# Patient Record
Sex: Male | Born: 1980 | Race: Black or African American | Hispanic: No | Marital: Married | Smoking: Current every day smoker
Health system: Southern US, Community
[De-identification: ages and names within clinical notes are randomized; demographics above are authoritative.]

## PROBLEM LIST (undated history)

## (undated) DIAGNOSIS — M549 Dorsalgia, unspecified: Secondary | ICD-10-CM

## (undated) DIAGNOSIS — I10 Essential (primary) hypertension: Secondary | ICD-10-CM

## (undated) DIAGNOSIS — G8929 Other chronic pain: Secondary | ICD-10-CM

---

## 2012-11-19 ENCOUNTER — Emergency Department (HOSPITAL_COMMUNITY)
Admission: EM | Admit: 2012-11-19 | Discharge: 2012-11-19 | Disposition: A | Discharge status: Home / Self Care | Payer: Medicaid Other | Attending: Emergency Medicine | Admitting: Emergency Medicine

## 2012-11-19 ENCOUNTER — Encounter (HOSPITAL_COMMUNITY): Payer: Self-pay | Admitting: Emergency Medicine

## 2012-11-19 ENCOUNTER — Emergency Department (HOSPITAL_COMMUNITY): Payer: Medicaid Other

## 2012-11-19 DIAGNOSIS — Y9367 Activity, basketball: Secondary | ICD-10-CM | POA: Insufficient documentation

## 2012-11-19 DIAGNOSIS — S20219A Contusion of unspecified front wall of thorax, initial encounter: Secondary | ICD-10-CM | POA: Insufficient documentation

## 2012-11-19 DIAGNOSIS — Y9239 Other specified sports and athletic area as the place of occurrence of the external cause: Secondary | ICD-10-CM | POA: Insufficient documentation

## 2012-11-19 DIAGNOSIS — S20211A Contusion of right front wall of thorax, initial encounter: Secondary | ICD-10-CM

## 2012-11-19 DIAGNOSIS — S298XXA Other specified injuries of thorax, initial encounter: Secondary | ICD-10-CM | POA: Insufficient documentation

## 2012-11-19 DIAGNOSIS — X58XXXA Exposure to other specified factors, initial encounter: Secondary | ICD-10-CM | POA: Insufficient documentation

## 2012-11-19 DIAGNOSIS — R0789 Other chest pain: Secondary | ICD-10-CM

## 2012-11-19 DIAGNOSIS — F17200 Nicotine dependence, unspecified, uncomplicated: Secondary | ICD-10-CM | POA: Insufficient documentation

## 2012-11-19 MED ORDER — HYDROCODONE-ACETAMINOPHEN 5-325 MG PO TABS: 1.0000 | ORAL_TABLET | Freq: Four times a day (QID) | ORAL | Status: DC | PRN

## 2012-11-19 MED ORDER — IBUPROFEN 800 MG PO TABS: 800.0000 mg | ORAL_TABLET | Freq: Three times a day (TID) | ORAL | Status: DC | PRN

## 2012-11-19 NOTE — ED Notes (Signed)
Pt is in a gown and on the monitor. 

## 2012-11-19 NOTE — ED Notes (Signed)
Pt returned from xray

## 2012-11-19 NOTE — ED Provider Notes (Signed)
Medical screening examination/treatment/procedure(s) were conducted as a shared visit with non-physician practitioner(s) or resident and myself. I personally evaluated the patient during the encounter and agree with the findings and plan unless otherwise indicated. I have reviewed any xrays and/ or EKG's with the provider and I agree with interpretation.  Right rib pain since knee injury during bball on Tue. Mild tender to palpation, no step off. Pain with movement.  Lungs clear, no distress. Discussed rib contusion for occult fx. Xray reviewed, no acute findings and supportive care. DC Rib contusion   Enid Skeens, MD 11/19/12 2128

## 2012-11-19 NOTE — ED Notes (Signed)
Pt was playing basketball on Tuesday and kneed in the right central chest area.  C/o of soreness and "muscle pain" especially when lifting right arm. Pt in NAD A&O.

## 2012-11-19 NOTE — ED Provider Notes (Signed)
CSN: [id number]     Arrival date & time 11/19/12  7829 History   First MD Initiated Contact with Patient 11/19/12 [phone]     Chief Complaint  Patient presents with  . Chest Pain   (Consider location/radiation/quality/duration/timing/severity/associated sxs/prior Treatment) HPI Patient presents emergency Department, right-sided chest pain, following an injury, that occurred on Tuesday.  Patient, states he was playing basketball.  He was kneed in the right lateral side of his ribs.  Patient, states she was using a heating pad, with some relief of his pain did not take any medications prior to arrival.  Patient denies shortness of breath, nausea, vomiting, abdominal pain, back pain, dizziness, lightheadedness, or syncope.  The patient, states, that he has more pain when he sneezes or coughs History reviewed. No pertinent past medical history. History reviewed. No pertinent past surgical history. History reviewed. No pertinent family history. History  Substance Use Topics  . Smoking status: Current Every Day Smoker  . Smokeless tobacco: Never Used  . Alcohol Use: 3.0 oz/week    5 Cans of beer per week    Review of Systems All other systems negative except as documented in the HPI. All pertinent positives and negatives as reviewed in the HPI. Allergies  Review of patient's allergies indicates no known allergies.  Home Medications   Current Outpatient Rx  Name  Route  Sig  Dispense  Refill  . ibuprofen (ADVIL,MOTRIN) 200 MG tablet   Oral   Take 800 mg by mouth every 6 (six) hours as needed for pain.          BP 146/83  Pulse 99  Temp(Src) 97.9 F (36.6 C) (Oral)  Resp 18  SpO2 100% Physical Exam  Nursing note and vitals reviewed. Constitutional: He is oriented to person, place, and time. He appears well-developed and well-nourished. No distress.  HENT:  Head: Normocephalic and atraumatic.  Mouth/Throat: Oropharynx is clear and moist.  Eyes: Pupils are equal, round, and  reactive to light.  Cardiovascular: Normal rate, regular rhythm and normal heart sounds.  Exam reveals no gallop and no friction rub.   No murmur heard. Pulmonary/Chest: Effort normal and breath sounds normal. No respiratory distress. He exhibits tenderness and bony tenderness. He exhibits no crepitus, no deformity and no swelling.    Neurological: He is alert and oriented to person, place, and time.  Skin: Skin is warm and dry. No erythema.    ED Course  Procedures (including critical care time) Labs Review Labs Reviewed - No data to display Imaging Review Dg Ribs Unilateral W/chest Right  11/19/2012   CLINICAL DATA:  Right lower anterior rib pain. Patient injured 5 days ago.  EXAM: RIGHT RIBS AND CHEST - 3+ VIEW  COMPARISON:  None.  FINDINGS: No fracture or other bone lesions are seen involving the ribs. There is no evidence of pneumothorax or pleural effusion. Both lungs are clear. Heart size and mediastinal contours are within normal limits.  IMPRESSION: Negative.   Electronically Signed   By: Amie Portland M.D.   On: 11/19/2012 09:31   Patient does not have any abnormality noted on rib films.  The patient be treated for contusion of his ribs and chest wall.  Told to return here as needed.  Advised to use ice and heat on the area    [employer], [place] 11/19/12 0957

## 2013-04-20 ENCOUNTER — Encounter (HOSPITAL_COMMUNITY): Payer: Self-pay | Admitting: Emergency Medicine

## 2013-04-20 ENCOUNTER — Emergency Department (HOSPITAL_COMMUNITY): Admission: EM | Admit: 2013-04-20 | Discharge: 2013-04-20 | Payer: Self-pay

## 2013-04-20 ENCOUNTER — Emergency Department (HOSPITAL_COMMUNITY)
Admission: EM | Admit: 2013-04-20 | Discharge: 2013-04-20 | Disposition: A | Discharge status: Home / Self Care | Payer: Medicaid Other | Attending: Emergency Medicine | Admitting: Emergency Medicine

## 2013-04-20 DIAGNOSIS — S025XXA Fracture of tooth (traumatic), initial encounter for closed fracture: Secondary | ICD-10-CM

## 2013-04-20 DIAGNOSIS — R51 Headache: Secondary | ICD-10-CM | POA: Insufficient documentation

## 2013-04-20 DIAGNOSIS — G479 Sleep disorder, unspecified: Secondary | ICD-10-CM | POA: Insufficient documentation

## 2013-04-20 DIAGNOSIS — F17200 Nicotine dependence, unspecified, uncomplicated: Secondary | ICD-10-CM | POA: Insufficient documentation

## 2013-04-20 DIAGNOSIS — R638 Other symptoms and signs concerning food and fluid intake: Secondary | ICD-10-CM | POA: Insufficient documentation

## 2013-04-20 DIAGNOSIS — K0381 Cracked tooth: Secondary | ICD-10-CM | POA: Insufficient documentation

## 2013-04-20 DIAGNOSIS — K029 Dental caries, unspecified: Secondary | ICD-10-CM | POA: Insufficient documentation

## 2013-04-20 DIAGNOSIS — K0889 Other specified disorders of teeth and supporting structures: Secondary | ICD-10-CM

## 2013-04-20 MED ORDER — AMOXICILLIN 500 MG PO CAPS: 500.0000 mg | ORAL_CAPSULE | Freq: Three times a day (TID) | ORAL | Status: DC

## 2013-04-20 MED ORDER — HYDROCODONE-ACETAMINOPHEN 5-325 MG PO TABS: 1.0000 | ORAL_TABLET | ORAL | Status: DC | PRN

## 2013-04-20 NOTE — ED Notes (Signed)
C/O left lower dental pain x 2 days. States it "hurt so bad, I just pulled it out". States tooth was broken. Appears to be pieces of tooth still remaining in gum.

## 2013-04-20 NOTE — ED Provider Notes (Signed)
CSN: [id number]     Arrival date & time 04/20/13  0900 History  This chart was scribed for non-physician practitioner Phillip Matasiffany G Dolph Tavano, [name] working with Glynn OctaveStephen Rancour, MD by Leone PayorSonum Patel, ED Scribe. This patient was seen in room TR05C/TR05C and the patient's care was started at 9:18 AM.    Chief Complaint  Patient presents with  . Dental Pain      The history is provided by the patient. No language interpreter was used.    HPI Comments: Phillip Mcconnell is a 33 y.o. male who presents to the Emergency Department complaining of constant left lower dental pain that worsened 2 days ago. Pt states he has had this pain for years but decided to pull his own tooth at home 2 days ago. He states the tooth was already broken at the time and he couldn't afford to see a dentist. He reports associated HA, decreased appetite, and difficulty sleeping due to the pain. He has not tried any home remedies for his symptoms. He denies known fever, nausea, vomiting.   History reviewed. No pertinent past medical history. History reviewed. No pertinent past surgical history. No family history on file. History  Substance Use Topics  . Smoking status: Current Every Day Smoker  . Smokeless tobacco: Never Used  . Alcohol Use: 3.0 oz/week    5 Cans of beer per week    Review of Systems  Constitutional: Negative for fever.  HENT: Positive for dental problem.   Gastrointestinal: Negative for nausea and vomiting.  All other systems reviewed and are negative.      Allergies  Review of patient's allergies indicates no known allergies.  Home Medications   Current Outpatient Rx  Name  Route  Sig  Dispense  Refill  . ibuprofen (ADVIL,MOTRIN) 200 MG tablet   Oral   Take 800 mg by mouth every 6 (six) hours as needed for pain.         Marland Kitchen. amoxicillin (AMOXIL) 500 MG capsule   Oral   Take 1 capsule (500 mg total) by mouth 3 (three) times daily.   21 capsule   0   . HYDROcodone-acetaminophen  (NORCO/VICODIN) 5-325 MG per tablet   Oral   Take 1-2 tablets by mouth every 4 (four) hours as needed.   20 tablet   0    BP 142/107  Pulse 84  Temp(Src) 98.5 F (36.9 C) (Oral)  SpO2 99% Physical Exam  Nursing note and vitals reviewed. Constitutional: He is oriented to person, place, and time. He appears well-developed and well-nourished.  HENT:  Head: Normocephalic and atraumatic.  Mouth/Throat: Dental caries present.    Eyes: Conjunctivae and EOM are normal. Pupils are equal, round, and reactive to light.  Neck: Normal range of motion. Neck supple.  Cardiovascular: Normal rate and regular rhythm.   Pulmonary/Chest: Effort normal and breath sounds normal.  Abdominal: He exhibits no distension.  Neurological: He is alert and oriented to person, place, and time.  Skin: Skin is warm and dry.  Psychiatric: He has a normal mood and affect.    ED Course  Dental Date/Time: 04/20/2013 9:30 AM Performed by: Phillip MatasGREENE, Candia Kingsbury G Authorized by: Phillip MatasGREENE, Brees Hounshell G Consent: Verbal consent obtained. Risks and benefits: risks, benefits and alternatives were discussed Consent given by: patient Time out: Immediately prior to procedure a "time out" was called to verify the correct patient, procedure, equipment, support staff and site/side marked as required. Local anesthesia used: yes Anesthesia: nerve block Local anesthetic: lidocaine 1% with  epinephrine Patient sedated: no Patient tolerance: Patient tolerated the procedure well with no immediate complications. Comments: Patients pain completely resolved with dental block   (including critical care time)  DIAGNOSTIC STUDIES: Oxygen Saturation is 99% on RA, normal by my interpretation.    COORDINATION OF CARE: 9:19 AM Will perform dental block. Will prescribe antibiotics and pain medication. Discussed treatment plan with pt at bedside and pt agreed to plan.   Labs Review Labs Reviewed - No data to display Imaging Review No results  found.   EKG Interpretation None      MDM   Final diagnoses:  Broken tooth  Pain, dental    Patient has dental pain. No emergent s/sx's present. Patent airway. No trismus.  Will be given pain medication and antibiotics. I discussed the need to call dentist within 24/48 hours for follow-up. Dental referral given. Return to ED precautions given.  Pt voiced understanding and has agreed to follow-up.   33 y.o.[name]'s evaluation in the Emergency Department is complete. It has been determined that no acute conditions requiring further emergency intervention are present at this time. The patient/guardian have been advised of the diagnosis and plan. We have discussed signs and symptoms that warrant return to the ED, such as changes or worsening in symptoms.  Vital signs are stable at discharge. Filed Vitals:   04/20/13 0916  BP: 142/107  Pulse: 84  Temp: 98.5 F (36.9 C)    Patient/guardian has voiced understanding and agreed to follow-up with the PCP or specialist.   Phillip Matas, [name] 04/20/13 0930

## 2013-04-20 NOTE — Discharge Instructions (Signed)
Dental Care and Dentist Visits Dental care supports good overall health. Regular dental visits can also help you avoid dental pain, bleeding, infection, and other more serious health problems in the future. It is important to keep the mouth healthy because diseases in the teeth, gums, and other oral tissues can spread to other areas of the body. Some problems, such as diabetes, heart disease, and pre-term labor have been associated with poor oral health.  See your dentist every 6 months. If you experience emergency problems such as a toothache or broken tooth, go to the dentist right away. If you see your dentist regularly, you may catch problems early. It is easier to be treated for problems in the early stages.  WHAT TO EXPECT AT A DENTIST VISIT  Your dentist will look for many common oral health problems and recommend proper treatment. At your regular dental visit, you can expect:  Gentle cleaning of the teeth and gums. This includes scraping and polishing. This helps to remove the sticky substance around the teeth and gums (plaque). Plaque forms in the mouth shortly after eating. Over time, plaque hardens on the teeth as tartar. If tartar is not removed regularly, it can cause problems. Cleaning also helps remove stains.  Periodic X-rays. These pictures of the teeth and supporting bone will help your dentist assess the health of your teeth.  Periodic fluoride treatments. Fluoride is a natural mineral shown to help strengthen teeth. Fluoride treatmentinvolves applying a fluoride gel or varnish to the teeth. It is most commonly done in children.  Examination of the mouth, tongue, jaws, teeth, and gums to look for any oral health problems, such as:  Cavities (dental caries). This is decay on the tooth caused by plaque, sugar, and acid in the mouth. It is best to catch a cavity when it is small.  Inflammation of the gums caused by plaque buildup (gingivitis).  Problems with the mouth or malformed  or misaligned teeth.  Oral cancer or other diseases of the soft tissues or jaws. KEEP YOUR TEETH AND GUMS HEALTHY For healthy teeth and gums, follow these general guidelines as well as your dentist's specific advice:  Have your teeth professionally cleaned at the dentist every 6 months.  Brush twice daily with a fluoride toothpaste.  Floss your teeth daily.  Ask your dentist if you need fluoride supplements, treatments, or fluoride toothpaste.  Eat a healthy diet. Reduce foods and drinks with added sugar.  Avoid smoking. TREATMENT FOR ORAL HEALTH PROBLEMS If you have oral health problems, treatment varies depending on the conditions present in your teeth and gums.  Your caregiver will most likely recommend good oral hygiene at each visit.  For cavities, gingivitis, or other oral health disease, your caregiver will perform a procedure to treat the problem. This is typically done at a separate appointment. Sometimes your caregiver will refer you to another dental specialist for specific tooth problems or for surgery. SEEK IMMEDIATE DENTAL CARE IF:  You have pain, bleeding, or soreness in the gum, tooth, jaw, or mouth area.  A permanent tooth becomes loose or separated from the gum socket.  You experience a blow or injury to the mouth or jaw area. Document Released: 09/30/2010 Document Revised: 04/12/2011 Document Reviewed: 09/30/2010 [facility] Patient Information 2014 [place], [place].  Dental Fracture You have a dental fracture or injury. This can mean the tooth is loose, has a chip in the enamel or is broken. If just the outer enamel is chipped, there is a good chance  the tooth will not become infected. The only treatment needed may be to smooth off a rough edge. Fractures into the deeper layers (dentin and pulp) cause greater pain and are more likely to become infected. These require you to see a dentist as soon as possible to save the tooth. Loose teeth may need to be wired or  bonded with a plastic splint to hold them in place. A paste may be painted on the open area of the broken tooth to reduce the pain. Antibiotics and pain medicine may be prescribed. Choosing a soft or liquid diet and rinsing the mouth out with warm water after meals may be helpful. See your dentist as recommended. Failure to seek care or follow up with a dentist or other specialist as recommended could result in the loss of your tooth, infection, or permanent dental problems. SEEK MEDICAL CARE IF:   You have increased pain not controlled with medicines.  You have swelling around the tooth, in the face or neck.  You have bleeding which starts, continues, or gets worse.  You have a fever. Document Released: 02/26/2004 Document Revised: 04/12/2011 Document Reviewed: 12/10/2008 [facility] Patient Information 2014 [place]

## 2013-04-20 NOTE — ED Provider Notes (Signed)
Medical screening examination/treatment/procedure(s) were performed by non-physician practitioner and as supervising physician I was immediately available for consultation/collaboration.   EKG Interpretation None       Siddhant Hashemi, MD 04/20/13 1824 

## 2013-12-15 ENCOUNTER — Encounter (HOSPITAL_COMMUNITY): Payer: Self-pay | Admitting: *Deleted

## 2013-12-15 ENCOUNTER — Emergency Department (HOSPITAL_COMMUNITY)
Admission: EM | Admit: 2013-12-15 | Discharge: 2013-12-15 | Disposition: A | Discharge status: Home / Self Care | Payer: Medicaid Other | Attending: Emergency Medicine | Admitting: Emergency Medicine

## 2013-12-15 ENCOUNTER — Emergency Department (HOSPITAL_COMMUNITY): Payer: Medicaid Other

## 2013-12-15 DIAGNOSIS — S4992XA Unspecified injury of left shoulder and upper arm, initial encounter: Secondary | ICD-10-CM | POA: Diagnosis present

## 2013-12-15 DIAGNOSIS — Y9289 Other specified places as the place of occurrence of the external cause: Secondary | ICD-10-CM | POA: Diagnosis not present

## 2013-12-15 DIAGNOSIS — Y998 Other external cause status: Secondary | ICD-10-CM | POA: Diagnosis not present

## 2013-12-15 DIAGNOSIS — Z720 Tobacco use: Secondary | ICD-10-CM | POA: Insufficient documentation

## 2013-12-15 DIAGNOSIS — Z792 Long term (current) use of antibiotics: Secondary | ICD-10-CM | POA: Diagnosis not present

## 2013-12-15 DIAGNOSIS — Y9389 Activity, other specified: Secondary | ICD-10-CM | POA: Insufficient documentation

## 2013-12-15 DIAGNOSIS — X58XXXA Exposure to other specified factors, initial encounter: Secondary | ICD-10-CM | POA: Insufficient documentation

## 2013-12-15 MED ORDER — NAPROXEN 500 MG PO TABS: 500.0000 mg | ORAL_TABLET | Freq: Two times a day (BID) | ORAL | Status: DC

## 2013-12-15 MED ORDER — HYDROCODONE-ACETAMINOPHEN 5-325 MG PO TABS: 1.0000 | ORAL_TABLET | Freq: Four times a day (QID) | ORAL | Status: DC | PRN

## 2013-12-15 MED ORDER — HYDROCODONE-ACETAMINOPHEN 5-325 MG PO TABS
1.0000 | ORAL_TABLET | Freq: Once | ORAL | Status: AC
  Administered 2013-12-15: 1 via ORAL
  Filled 2013-12-15: qty 1

## 2013-12-15 NOTE — ED Notes (Signed)
Pt states that he was moving his pool table on Monday and woke with left shoulder pain on Tuesday. Pt states that he has tried medication and heat/ice with little relief. No deformity noted pt states that it is tender to touch.

## 2013-12-15 NOTE — ED Notes (Signed)
Patient transported to X-ray 

## 2013-12-15 NOTE — Discharge Instructions (Signed)
X-rays of the shoulder negative. Suspect rotator cuff injury. Follow-up with orthopedics. Wear the sling as needed for comfort. Take anti-inflammatory medicine on a normal basis that's the Naprosyn. Supplement with hydrocodone as needed.

## 2013-12-15 NOTE — ED Provider Notes (Signed)
CSN: [id number]     Arrival date & time 12/15/13  0745 History   First MD Initiated Contact with Patient 12/15/13 0800     Chief Complaint  Patient presents with  . Shoulder Injury     (Consider location/radiation/quality/duration/timing/severity/associated sxs/prior Treatment) Patient is a [age] y.o. male presenting with shoulder injury. The history is provided by the patient.  Shoulder Injury Pertinent negatives include no chest pain, no abdominal pain, no headaches and no shortness of breath.  patient with left shoulder pain pain started on Tuesday after moving a heavy pool table on Monday. Patient has limited range of motion in that left shoulder cannot raise his hand above his head.describe some tingling to the lateral aspect of the shoulder. Most of the pain is in the anterior part of the shoulder. No direct injury. The patient was pulling on the pool table very hard. Patient states that the pain is 8 out of 10 made worse by movement of the left shoulder.  History reviewed. No pertinent past medical history. History reviewed. No pertinent past surgical history. No family history on file. History  Substance Use Topics  . Smoking status: Current Every Day Smoker  . Smokeless tobacco: Never Used  . Alcohol Use: 3.0 oz/week    5 Cans of beer per week    Review of Systems  Constitutional: Negative for fever.  HENT: Negative for congestion.   Eyes: Negative for visual disturbance.  Respiratory: Negative for shortness of breath.   Cardiovascular: Negative for chest pain.  Gastrointestinal: Negative for abdominal pain.  Genitourinary: Negative for hematuria.  Musculoskeletal: Negative for back pain and neck pain.  Skin: Negative for rash.  Neurological: Negative for headaches.  Hematological: Does not bruise/bleed easily.  Psychiatric/Behavioral: Negative for confusion.      Allergies  Review of patient's allergies indicates no known allergies.  Home Medications   Prior to  Admission medications   Medication Sig Start Date End Date Taking? Authorizing Provider  diphenhydramine-acetaminophen (TYLENOL PM) 25-500 MG TABS Take 2 tablets by mouth at bedtime as needed (for sleep).   Yes Historical Provider, MD  amoxicillin (AMOXIL) 500 MG capsule Take 1 capsule (500 mg total) by mouth 3 (three) times daily. 04/20/13   Dorthula Matasiffany G Greene, [name]  HYDROcodone-acetaminophen (NORCO/VICODIN) 5-325 MG per tablet Take 1-2 tablets by mouth every 6 (six) hours as needed for moderate pain. 12/15/13   Vanetta MuldersScott Cynithia Hakimi, MD  ibuprofen (ADVIL,MOTRIN) 200 MG tablet Take 800 mg by mouth every 6 (six) hours as needed for pain.    Historical Provider, MD  naproxen (NAPROSYN) 500 MG tablet Take 1 tablet (500 mg total) by mouth 2 (two) times daily. 12/15/13   Vanetta MuldersScott Naquita Nappier, MD   BP 138/97 mmHg  Pulse 89  Temp(Src) 98.2 F (36.8 C) (Oral)  Resp 18  SpO2 100% Physical Exam  Constitutional: He is oriented to person, place, and time. He appears well-developed and well-nourished. No distress.  HENT:  Head: Normocephalic and atraumatic.  Mouth/Throat: Oropharynx is clear and moist.  Eyes: Conjunctivae and EOM are normal. Pupils are equal, round, and reactive to light.  Neck: Normal range of motion.  Cardiovascular: Normal rate, regular rhythm and normal heart sounds.   No murmur heard. Pulmonary/Chest: Effort normal and breath sounds normal. No respiratory distress.  Abdominal: Soft. Bowel sounds are normal. There is no tenderness.  Musculoskeletal: He exhibits tenderness.  Tenderness to palpation to the anterior aspect of the left shoulder. Good range of motion at the elbow and wrist and  fingers limited range of motion due to pain at the shoulder. No obvious deformity. Radial pulse distally is 2+. No sensory deficits to the hand. Some tingling in the distribution of the axillary nerve.  Neurological: He is alert and oriented to person, place, and time. No cranial nerve deficit. He exhibits  normal muscle tone. Coordination normal.  Skin: Skin is warm. No rash noted.  Nursing note and vitals reviewed.   ED Course  Procedures (including critical care time) Labs Review Labs Reviewed - No data to display  Imaging Review Dg Shoulder Left  12/15/2013   CLINICAL DATA:  Shoulder injury, left, initial encounter. The patient was pulling a pool table 6 days ago with anterior shoulder pain since that time.  EXAM: LEFT SHOULDER - 2+ VIEW  COMPARISON:  None.  FINDINGS: There is no evidence of fracture or dislocation. There is no evidence of arthropathy or other focal bone abnormality. Soft tissues are unremarkable.  IMPRESSION: Negative left shoulder radiographs.   Electronically Signed   By: Gennette Pachris  Mattern M.D.   On: 12/15/2013 10:03     EKG Interpretation None      MDM   Final diagnoses:  Shoulder injury, left, initial encounter    Left shoulder pain is concerning for rotator cuff injury. Based on the decreased range of motion. And the weighted occurred pulling on heavy pole table. Patient treated with sling and follow-up with orthopedics as well as anti-inflammatory medicine and pain medicine. X-rays of the area showed no bony abnormalities. There is no significant neuro deficits. Distal radial pulses 2+.    Vanetta MuldersScott Abdulai Blaylock, MD 12/15/13 1026

## 2014-03-13 ENCOUNTER — Emergency Department (HOSPITAL_COMMUNITY)
Admission: EM | Admit: 2014-03-13 | Discharge: 2014-03-13 | Disposition: A | Discharge status: Home / Self Care | Payer: Medicaid Other | Attending: Emergency Medicine | Admitting: Emergency Medicine

## 2014-03-13 ENCOUNTER — Encounter (HOSPITAL_COMMUNITY): Payer: Self-pay | Admitting: Emergency Medicine

## 2014-03-13 DIAGNOSIS — M545 Low back pain, unspecified: Secondary | ICD-10-CM

## 2014-03-13 DIAGNOSIS — Z720 Tobacco use: Secondary | ICD-10-CM | POA: Insufficient documentation

## 2014-03-13 DIAGNOSIS — Z791 Long term (current) use of non-steroidal anti-inflammatories (NSAID): Secondary | ICD-10-CM | POA: Diagnosis not present

## 2014-03-13 DIAGNOSIS — Z792 Long term (current) use of antibiotics: Secondary | ICD-10-CM | POA: Diagnosis not present

## 2014-03-13 MED ORDER — TRAMADOL HCL 50 MG PO TABS: 50.0000 mg | ORAL_TABLET | Freq: Four times a day (QID) | ORAL | Status: DC | PRN

## 2014-03-13 MED ORDER — IBUPROFEN 800 MG PO TABS
800.0000 mg | ORAL_TABLET | Freq: Three times a day (TID) | ORAL | Status: DC
Start: 2014-03-13 — End: 2014-05-21

## 2014-03-13 MED ORDER — METHOCARBAMOL 500 MG PO TABS: 500.0000 mg | ORAL_TABLET | Freq: Four times a day (QID) | ORAL | Status: DC

## 2014-03-13 NOTE — ED Provider Notes (Signed)
CSN: [id number]     Arrival date & time 03/13/14  [id number] History   First MD Initiated Contact with Patient 03/13/14 [phone]     Chief Complaint  Patient presents with  . Back Pain     (Consider location/radiation/quality/duration/timing/severity/associated sxs/prior Treatment) HPI Comments: Patient presents with complaint of lower back pain, gradual onset, bilateral lower back but a little worse on the right starting 2 days ago. Patient works at [employer]. He does a lot of heavy lifting, pushing and pulling at work. Pain does not radiate into his legs except when he is walking he feels a little bit of pain in his right upper leg. No numbness or tingling. No weakness in his leg. No difficulty walking. Patient denies warning symptoms of back pain including: fecal incontinence, urinary retention or overflow incontinence, night sweats, waking from sleep with back pain, unexplained fevers or weight loss, h/o cancer, IVDU, recent trauma.     Patient is a 34 y.o. male presenting with back pain. The history is provided by the patient.  Back Pain Associated symptoms: no fever, no numbness and no weakness     History reviewed. No pertinent past medical history. History reviewed. No pertinent past surgical history. History reviewed. No pertinent family history. History  Substance Use Topics  . Smoking status: Current Every Day Smoker -- 0.50 packs/day    Types: Cigarettes  . Smokeless tobacco: Never Used  . Alcohol Use: 3.0 oz/week    5 Cans of beer per week    Review of Systems  Constitutional: Negative for fever and unexpected weight change.  Gastrointestinal: Negative for constipation.       Neg for fecal incontinence  Genitourinary: Negative for hematuria, flank pain and difficulty urinating.       Negative for urinary incontinence or retention  Musculoskeletal: Positive for back pain.  Neurological: Negative for weakness and numbness.       Negative for saddle paresthesias        Allergies  Review of patient's allergies indicates no known allergies.  Home Medications   Prior to Admission medications   Medication Sig Start Date End Date Taking? Authorizing Provider  amoxicillin (AMOXIL) 500 MG capsule Take 1 capsule (500 mg total) by mouth 3 (three) times daily. 04/20/13   Tiffany Irine SealG Greene, [name]  diphenhydramine-acetaminophen (TYLENOL PM) 25-500 MG TABS Take 2 tablets by mouth at bedtime as needed (for sleep).    Historical Provider, MD  HYDROcodone-acetaminophen (NORCO/VICODIN) 5-325 MG per tablet Take 1-2 tablets by mouth every 6 (six) hours as needed for moderate pain. 12/15/13   Vanetta MuldersScott Zackowski, MD  ibuprofen (ADVIL,MOTRIN) 200 MG tablet Take 800 mg by mouth every 6 (six) hours as needed for pain.    Historical Provider, MD  naproxen (NAPROSYN) 500 MG tablet Take 1 tablet (500 mg total) by mouth 2 (two) times daily. 12/15/13   Vanetta MuldersScott Zackowski, MD   BP 146/90 mmHg  Pulse 79  Temp(Src) 98 F (36.7 C) (Oral)  Resp 14  Ht 5\' 5"  (1.651 m)  Wt 160 lb (72.576 kg)  BMI 26.63 kg/m2  SpO2 98% Physical Exam  Constitutional: He appears well-developed and well-nourished.  HENT:  Head: Normocephalic and atraumatic.  Eyes: Conjunctivae are normal.  Neck: Normal range of motion.  Abdominal: Soft. There is no tenderness. There is no CVA tenderness.  Musculoskeletal: Normal range of motion.       Cervical back: He exhibits normal range of motion, no tenderness and no bony tenderness.  Thoracic back: He exhibits normal range of motion, no tenderness and no bony tenderness.       Lumbar back: He exhibits tenderness. He exhibits normal range of motion and no bony tenderness.       Back:  No step-off noted with palpation of spine.   Neurological: He is alert. He has normal reflexes. No sensory deficit. He exhibits normal muscle tone.  5/5 strength in entire lower extremities bilaterally. No sensation deficit.   Skin: Skin is warm and dry.  Psychiatric: He  has a normal mood and affect.  Nursing note and vitals reviewed.   ED Course  Procedures (including critical care time) Labs Review Labs Reviewed - No data to display  Imaging Review No results found.   EKG Interpretation None       7:20 AM Patient seen and examined. Work-up initiated. Medications ordered.   Vital signs reviewed and are as follows: Filed Vitals:   03/13/14 0706  BP: 146/90  Pulse: 79  Temp: 98 F (36.7 C)  Resp: 14    No red flag s/s of low back pain. Patient was counseled on back pain precautions and told to do activity as tolerated but do not lift, push, or pull heavy objects more than 10 pounds for the next week.  Patient counseled to use ice or heat on back for no longer than 15 minutes every hour.   Patient prescribed muscle relaxer and counseled on proper use of muscle relaxant medication.    Patient prescribed narcotic pain medicine and counseled on proper use of narcotic pain medications. Counseled not to combine this medication with others containing tylenol.   Urged patient not to drink alcohol, drive, or perform any other activities that requires focus while taking either of these medications.  Patient urged to follow-up with PCP if pain does not improve with treatment and rest or if pain becomes recurrent. Urged to return with worsening severe pain, loss of bowel or bladder control, trouble walking.   The patient verbalizes understanding and agrees with the plan.    MDM   Final diagnoses:  Bilateral low back pain without sciatica   Patient with back pain. No neurological deficits. Patient is ambulatory. No warning symptoms of back pain including: fecal incontinence, urinary retention or overflow incontinence, night sweats, waking from sleep with back pain, unexplained fevers or weight loss, h/o cancer, IVDU, recent trauma. No concern for cauda equina, epidural abscess, or other serious cause of back pain. Conservative measures such as rest,  ice/heat and pain medicine indicated with PCP follow-up if no improvement with conservative management.      Renne Crigler, [name] 03/13/14 1610  Samuel Jester, DO 03/13/14 1944

## 2014-03-13 NOTE — ED Notes (Signed)
Pt states that he had a sudden onset of lower back pain on Monday after work. Pt states that he has been having muscle spasms in the same area. Pt denies injury or trauma.

## 2014-03-13 NOTE — Discharge Instructions (Signed)
Please read and follow all provided instructions.  Your diagnoses today include:  1. Bilateral low back pain without sciatica     Tests performed today include:  Vital signs - see below for your results today  Medications prescribed:   Tramadol - narcotic-like pain medication  DO NOT drive or perform any activities that require you to be awake and alert because this medicine can make you drowsy.    Robaxin (methocarbamol) - muscle relaxer medication  DO NOT drive or perform any activities that require you to be awake and alert because this medicine can make you drowsy.    Ibuprofen (Motrin, Advil) - anti-inflammatory pain medication  Do not exceed 600mg  ibuprofen every 6 hours, take with food  You have been prescribed an anti-inflammatory medication or NSAID. Take with food. Take smallest effective dose for the shortest duration needed for your pain. Stop taking if you experience stomach pain or vomiting.   Take any prescribed medications only as directed.  Home care instructions:   Follow any educational materials contained in this packet  Please rest, use ice or heat on your back for the next several days  Do not lift, push, pull anything more than 10 pounds for the next week  Follow-up instructions: Please follow-up with your primary care provider in the next 1 week for further evaluation of your symptoms.   Return instructions:  SEEK IMMEDIATE MEDICAL ATTENTION IF YOU HAVE:  New numbness, tingling, weakness, or problem with the use of your arms or legs  Severe back pain not relieved with medications  Loss control of your bowels or bladder  Increasing pain in any areas of the body (such as chest or abdominal pain)  Shortness of breath, dizziness, or fainting.   Worsening nausea (feeling sick to your stomach), vomiting, fever, or sweats  Any other emergent concerns regarding your health   Additional Information:  Your vital signs today were: BP 146/90  mmHg   Pulse 79   Temp(Src) 98 F (36.7 C) (Oral)   Resp 14   Ht 5\' 5"  (1.651 m)   Wt 160 lb (72.576 kg)   BMI 26.63 kg/m2   SpO2 98% If your blood pressure (BP) was elevated above 135/85 this visit, please have this repeated by your doctor within one month. --------------

## 2014-05-21 ENCOUNTER — Encounter (HOSPITAL_COMMUNITY): Payer: Self-pay | Admitting: Neurology

## 2014-05-21 ENCOUNTER — Emergency Department (HOSPITAL_COMMUNITY): Payer: Medicaid Other

## 2014-05-21 ENCOUNTER — Emergency Department (HOSPITAL_COMMUNITY)
Admission: EM | Admit: 2014-05-21 | Discharge: 2014-05-21 | Disposition: A | Discharge status: Home / Self Care | Payer: Medicaid Other | Attending: Emergency Medicine | Admitting: Emergency Medicine

## 2014-05-21 DIAGNOSIS — Z791 Long term (current) use of non-steroidal anti-inflammatories (NSAID): Secondary | ICD-10-CM | POA: Insufficient documentation

## 2014-05-21 DIAGNOSIS — Y9389 Activity, other specified: Secondary | ICD-10-CM | POA: Diagnosis not present

## 2014-05-21 DIAGNOSIS — Z79899 Other long term (current) drug therapy: Secondary | ICD-10-CM | POA: Diagnosis not present

## 2014-05-21 DIAGNOSIS — Y92810 Car as the place of occurrence of the external cause: Secondary | ICD-10-CM | POA: Diagnosis not present

## 2014-05-21 DIAGNOSIS — Y998 Other external cause status: Secondary | ICD-10-CM | POA: Diagnosis not present

## 2014-05-21 DIAGNOSIS — Z720 Tobacco use: Secondary | ICD-10-CM | POA: Insufficient documentation

## 2014-05-21 DIAGNOSIS — W230XXA Caught, crushed, jammed, or pinched between moving objects, initial encounter: Secondary | ICD-10-CM | POA: Diagnosis not present

## 2014-05-21 DIAGNOSIS — Z792 Long term (current) use of antibiotics: Secondary | ICD-10-CM | POA: Diagnosis not present

## 2014-05-21 DIAGNOSIS — S99922A Unspecified injury of left foot, initial encounter: Secondary | ICD-10-CM | POA: Diagnosis present

## 2014-05-21 MED ORDER — IBUPROFEN 800 MG PO TABS: 800.0000 mg | ORAL_TABLET | Freq: Three times a day (TID) | ORAL | Status: DC

## 2014-05-21 NOTE — ED Provider Notes (Signed)
CSN: [id number]     Arrival date & time 05/21/14  9147 History  This chart was scribed for non-physician practitioner, Emilia Beck, working with Gerhard Munch, MD by Richarda Overlie, ED Scribe. This patient was seen in room TR05C/TR05C and the patient's care was started at 9:56 AM.   Chief Complaint  Patient presents with  . Foot Pain   The history is provided by the patient. No language interpreter was used.   HPI Comments: Phillip Mcconnell is a 34 y.o. male with no medical history who presents to the Emergency Department complaining of left foot foot pain since last night. Pt states he slammed his left foot in the car door last night and now complains of left great toe pain. Pt states that he has no pain or issues with his left ankle. He states that certain movements worsens his toe pain. Pt reports no alleviating factors at this time.   History reviewed. No pertinent past medical history. History reviewed. No pertinent past surgical history. No family history on file. History  Substance Use Topics  . Smoking status: Current Every Day Smoker -- 0.50 packs/day    Types: Cigarettes  . Smokeless tobacco: Never Used  . Alcohol Use: 3.0 oz/week    5 Cans of beer per week    Review of Systems  Musculoskeletal: Positive for arthralgias.  All other systems reviewed and are negative.     Allergies  Review of patient's allergies indicates no known allergies.  Home Medications   Prior to Admission medications   Medication Sig Start Date End Date Taking? Authorizing Provider  amoxicillin (AMOXIL) 500 MG capsule Take 1 capsule (500 mg total) by mouth 3 (three) times daily. 04/20/13   Tiffany Neva Seat, [name]  diphenhydramine-acetaminophen (TYLENOL PM) 25-500 MG TABS Take 2 tablets by mouth at bedtime as needed (for sleep).    Historical Provider, MD  ibuprofen (ADVIL,MOTRIN) 800 MG tablet Take 1 tablet (800 mg total) by mouth 3 (three) times daily. 03/13/14   Renne Crigler, [name]   methocarbamol (ROBAXIN) 500 MG tablet Take 1 tablet (500 mg total) by mouth 4 (four) times daily. 03/13/14   Renne Crigler, [name]  traMADol (ULTRAM) 50 MG tablet Take 1 tablet (50 mg total) by mouth every 6 (six) hours as needed. 03/13/14   Renne Crigler, [name]   BP 140/88 mmHg  Pulse 80  Temp(Src) 98.1 F (36.7 C) (Oral)  Resp 18  Ht  (1.651 m)  Wt 165 lb (74.844 kg)  BMI 27.46 kg/m2  SpO2 100% Physical Exam  Constitutional: He is oriented to person, place, and time. He appears well-developed and well-nourished.  HENT:  Head: Normocephalic and atraumatic.  Eyes: Right eye exhibits no discharge. Left eye exhibits no discharge.  Neck: Neck supple. No tracheal deviation present.  Cardiovascular: Normal rate.   Pulmonary/Chest: Effort normal. No respiratory distress.  Abdominal: He exhibits no distension. There is no tenderness.  Musculoskeletal: Normal range of motion.  Tenderness to palpation over the first metatarsal of left foot. Generalized edema of left foot without obvious deformity. Patient is able to wiggle toes.   Neurological: He is alert and oriented to person, place, and time. Coordination normal.  Skin: Skin is warm and dry.  Psychiatric: He has a normal mood and affect. His behavior is normal.  Nursing note and vitals reviewed.   ED Course  Procedures   DIAGNOSTIC STUDIES: Oxygen Saturation is 100% on RA, normal by my interpretation.    COORDINATION OF CARE: 9:59 AM  Discussed treatment plan with pt at bedside and pt agreed to plan.   Labs Review Labs Reviewed - No data to display  Imaging Review Dg Foot Complete Left  05/21/2014   CLINICAL DATA:  Slammed LEFT foot in car door yesterday, pain at great toe and into foot especially medially and superiorly, initial encounter  EXAM: LEFT FOOT - COMPLETE 3+ VIEW  COMPARISON:  None  FINDINGS: Bone island at head of proximal phalanx third toe.  Osseous mineralization otherwise normal.  Joint spaces preserved.  No  acute fracture, dislocation or bone destruction.  Soft tissues unremarkable.  IMPRESSION: Normal exam.   Electronically Signed   By: Ulyses SouthwardMark  Boles M.D.   On: 05/21/2014 09:29     EKG Interpretation None      MDM   Final diagnoses:  Foot injury, left, initial encounter    Xray negative for acute fracture. Patient will have naprosyn for pain. Patient advised to rest, ice, and elevate.   I personally performed the services described in this documentation, which was scribed in my presence. The recorded information has been reviewed and is accurate.      Emilia BeckKaitlyn Everrett Lacasse, [name] 05/21/14 1032  Gerhard Munchobert Lockwood, MD 05/24/14 [phone]

## 2014-05-21 NOTE — ED Notes (Signed)
Pt reports last night he slammed his left foot in the car door. C/o pain to left great toe.

## 2014-05-21 NOTE — Discharge Instructions (Signed)
Take ibuprofen as needed for pain. Rest, ice, and elevate your foot to reduce pain and swelling. Refer to attached documents for more information.

## 2015-02-13 ENCOUNTER — Emergency Department (HOSPITAL_COMMUNITY)
Admission: EM | Admit: 2015-02-13 | Discharge: 2015-02-13 | Disposition: A | Discharge status: Home / Self Care | Payer: Medicaid Other | Attending: Emergency Medicine | Admitting: Emergency Medicine

## 2015-02-13 ENCOUNTER — Encounter (HOSPITAL_COMMUNITY): Payer: Self-pay | Admitting: *Deleted

## 2015-02-13 DIAGNOSIS — F17210 Nicotine dependence, cigarettes, uncomplicated: Secondary | ICD-10-CM | POA: Diagnosis not present

## 2015-02-13 DIAGNOSIS — Z79899 Other long term (current) drug therapy: Secondary | ICD-10-CM | POA: Diagnosis not present

## 2015-02-13 DIAGNOSIS — J069 Acute upper respiratory infection, unspecified: Secondary | ICD-10-CM | POA: Diagnosis not present

## 2015-02-13 DIAGNOSIS — R197 Diarrhea, unspecified: Secondary | ICD-10-CM | POA: Diagnosis not present

## 2015-02-13 DIAGNOSIS — R05 Cough: Secondary | ICD-10-CM | POA: Diagnosis present

## 2015-02-13 DIAGNOSIS — Z791 Long term (current) use of non-steroidal anti-inflammatories (NSAID): Secondary | ICD-10-CM | POA: Insufficient documentation

## 2015-02-13 DIAGNOSIS — Z792 Long term (current) use of antibiotics: Secondary | ICD-10-CM | POA: Diagnosis not present

## 2015-02-13 NOTE — ED Notes (Signed)
Declined W/C at D/C and was escorted to lobby by RN. 

## 2015-02-13 NOTE — ED Notes (Signed)
Pt reports cough, congestion, runny nose for several days. Pt states that he has tried OTC medications.

## 2015-02-13 NOTE — Discharge Instructions (Signed)
Please read and follow all provided instructions.  Your diagnoses today include:  1. URI (upper respiratory infection)    You appear to have an upper respiratory infection (URI). An upper respiratory tract infection, or cold, is a viral infection of the air passages leading to the lungs. It should improve gradually after 5-7 days. You may have a lingering cough that lasts for 2- 4 weeks after the infection.  Tests performed today include:  Vital signs. See below for your results today.   Medications prescribed:   Take any prescribed medications only as directed. Treatment for your infection is aimed at treating the symptoms. There are no medications, such as antibiotics, that will cure your infection.   Home care instructions:  Follow any educational materials contained in this packet.   Your illness is contagious and can be spread to others, especially during the first 3 or 4 days. It cannot be cured by antibiotics or other medicines. Take basic precautions such as washing your hands often, covering your mouth when you cough or sneeze, and avoiding public places where you could spread your illness to others.   Please continue drinking plenty of fluids.  Use over-the-counter medicines as needed as directed on packaging for symptom relief.  You may also use ibuprofen or tylenol as directed on packaging for pain or fever.  Do not take multiple medicines containing Tylenol or acetaminophen to avoid taking too much of this medication.  Follow-up instructions: Please follow-up with your primary care provider in the next 3 days for further evaluation of your symptoms if you are not feeling better.   Return instructions:   Please return to the Emergency Department if you experience worsening symptoms.   RETURN IMMEDIATELY IF you develop shortness of breath, confusion or altered mental status, a new rash, become dizzy, faint, or poorly responsive, or are unable to be cared for at home.  Please  return if you have persistent vomiting and cannot keep down fluids or develop a fever that is not controlled by tylenol or motrin.    Please return if you have any other emergent concerns.  Additional Information:  Your vital signs today were: BP 143/86 mmHg   Pulse 64   Temp(Src) 98 F (36.7 C) (Oral)   Resp 20   Ht 5\' 5"  (1.651 m)   Wt 69.4 kg   BMI 25.46 kg/m2   SpO2 97% If your blood pressure (BP) was elevated above 135/85 this visit, please have this repeated by your doctor within one month. --------------

## 2015-02-13 NOTE — ED Notes (Signed)
Pt reports family has had a cold/ fever for over a week.

## 2015-02-13 NOTE — ED Provider Notes (Signed)
CSN: [id number]     Arrival date & time 02/13/15  0906 History  By signing my name below, I, Jarvis Morgan, attest that this documentation has been prepared under the direction and in the presence of Audry Pili, [name] Electronically Signed: Jarvis Morgan, ED Scribe. 02/13/2015. 10:21 AM.    Chief Complaint  Patient presents with  . Cough   The history is provided by the patient. No language interpreter was used.    HPI Comments: Phillip Mcconnell is a 35 y.o. male who no chronic medical conditions who presents to the Emergency Department complaining of intermittent, moderate cough for 4 days. He reports associated HA, congestion, rhinorrhea, sinus pressure, diarrhea x1, and fever (t-max 103.63F which was taken 3 days ago); pt's ED temp was 11F. He notes he took Theraflu last night with moderate relief. Pt reports he has had a sick contact in the home. Pt is a current every day smoker with 0.5ppd history. He did not get his flu vaccine this year. He denies any SOB, chest pain, abdominal pain, nausea, vomiting, or sore throat.   History reviewed. No pertinent past medical history. History reviewed. No pertinent past surgical history. No family history on file. Social History  Substance Use Topics  . Smoking status: Current Every Day Smoker -- 0.50 packs/day    Types: Cigarettes  . Smokeless tobacco: Never Used  . Alcohol Use: 3.0 oz/week    5 Cans of beer per week    Review of Systems  Constitutional: Positive for fever.  HENT: Positive for congestion, rhinorrhea and sinus pressure.   Respiratory: Negative for shortness of breath.   Cardiovascular: Negative for chest pain.  Gastrointestinal: Positive for diarrhea (x1). Negative for nausea, vomiting and abdominal pain.  Neurological: Positive for headaches.  10 Systems reviewed and all are negative for acute change except as noted in the HPI.  Allergies  Review of patient's allergies indicates no known allergies.  Home Medications    Prior to Admission medications   Medication Sig Start Date End Date Taking? Authorizing Provider  amoxicillin (AMOXIL) 500 MG capsule Take 1 capsule (500 mg total) by mouth 3 (three) times daily. 04/20/13   Tiffany Neva Seat, [name]  diphenhydramine-acetaminophen (TYLENOL PM) 25-500 MG TABS Take 2 tablets by mouth at bedtime as needed (for sleep).    Historical Provider, MD  ibuprofen (ADVIL,MOTRIN) 800 MG tablet Take 1 tablet (800 mg total) by mouth 3 (three) times daily. 05/21/14   Kaitlyn Szekalski, [name]  methocarbamol (ROBAXIN) 500 MG tablet Take 1 tablet (500 mg total) by mouth 4 (four) times daily. 03/13/14   Renne Crigler, [name]  traMADol (ULTRAM) 50 MG tablet Take 1 tablet (50 mg total) by mouth every 6 (six) hours as needed. 03/13/14   Renne Crigler, [name]   Triage Vitals: BP 143/86 mmHg  Pulse 64  Temp(Src) 98 F (36.7 C) (Oral)  Resp 20  Ht 5\' 5"  (1.651 m)  Wt 153 lb (69.4 kg)  BMI 25.46 kg/m2  SpO2 97%  Physical Exam  Constitutional: He is oriented to person, place, and time. He appears well-developed and well-nourished. No distress.  HENT:  Head: Normocephalic and atraumatic.  Right Ear: Hearing, tympanic membrane and ear canal normal.  Left Ear: Hearing, tympanic membrane and ear canal normal.  Nose: Nose normal.  Mouth/Throat: Uvula is midline, oropharynx is clear and moist and mucous membranes are normal.  Eyes: Conjunctivae and EOM are normal.  Neck: Neck supple. No tracheal deviation present.  Cardiovascular: Normal rate.  Pulmonary/Chest: Effort normal and breath sounds normal. No accessory muscle usage. No respiratory distress.  Musculoskeletal: Normal range of motion.  Lymphadenopathy:    He has no cervical adenopathy.  Neurological: He is alert and oriented to person, place, and time.  Skin: Skin is warm and dry.  Psychiatric: He has a normal mood and affect. His behavior is normal.  Nursing note and vitals reviewed.   ED Course  Procedures (including critical  care time)  DIAGNOSTIC STUDIES: Oxygen Saturation is 97% on RA, normal by my interpretation.    Labs Review Labs Reviewed - No data to display  Imaging Review No results found.    EKG Interpretation None      MDM  I obtained HPI from historian.  ED Course:  Assessment: 34yM presents with URI for the past week. Symptoms include: cough, congestion, rhinorrhea. On exam, bilateral TM, posterior pharynx unremarkable. No N/VD. Afebrile. Most likely viral URI and will have them follow up with PCP for further symptom management.  Patient is in no acute distress. Vital Signs are stable. Patient is able to ambulate. Patient able to tolerate PO.   Disposition/Plan:  DC Home Additional Verbal discharge instructions given and discussed with patient.  Pt Instructed to f/u with PCP in the next 48 hours for evaluation and treatment of symptoms. Return precautions given Pt acknowledges and agrees with plan   Supervising Physician Melene Planan Floyd, DO   Final diagnoses:  URI (upper respiratory infection)   I personally performed the services described in this documentation, which was scribed in my presence. The recorded information has been reviewed and is accurate.      Audry Piliyler Yanky Vanderburg, [name] 02/13/15 1036  Melene Planan Floyd, DO 02/13/15 1352

## 2016-09-04 ENCOUNTER — Encounter (HOSPITAL_COMMUNITY): Payer: Self-pay | Admitting: *Deleted

## 2016-09-04 ENCOUNTER — Emergency Department (HOSPITAL_COMMUNITY): Payer: Self-pay

## 2016-09-04 ENCOUNTER — Emergency Department (HOSPITAL_COMMUNITY)
Admission: EM | Admit: 2016-09-04 | Discharge: 2016-09-04 | Disposition: A | Discharge status: Home / Self Care | Payer: Self-pay | Attending: Emergency Medicine | Admitting: Emergency Medicine

## 2016-09-04 DIAGNOSIS — F17210 Nicotine dependence, cigarettes, uncomplicated: Secondary | ICD-10-CM | POA: Insufficient documentation

## 2016-09-04 DIAGNOSIS — M79641 Pain in right hand: Secondary | ICD-10-CM | POA: Insufficient documentation

## 2016-09-04 DIAGNOSIS — Z79899 Other long term (current) drug therapy: Secondary | ICD-10-CM | POA: Insufficient documentation

## 2016-09-04 DIAGNOSIS — R2231 Localized swelling, mass and lump, right upper limb: Secondary | ICD-10-CM | POA: Insufficient documentation

## 2016-09-04 MED ORDER — IBUPROFEN 600 MG PO TABS: 600.0000 mg | ORAL_TABLET | Freq: Four times a day (QID) | ORAL | 0 refills | Status: DC | PRN

## 2016-09-04 NOTE — ED Provider Notes (Addendum)
[facility]EMERGENCY DEPT Provider Note   CSN: [id number] Arrival date & time: 09/04/16  0420     History   Chief Complaint Chief Complaint  Patient presents with  . Joint Swelling    HPI Phillip Mcconnell is a 36 y.o.right handed male who presents with hand pain and swelling. No significant PMH. He states that his hand started swelling a little bit 5 days ago. It looked like a bump over the top of his hand and after massage it went down and got better. Over the next couple days the swelling returned and became diffuse. He reports associated difficulty making a fist. The pain is worse over his middle finger. He has never had this before. He has tried icing it with no relief. 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 No trauma to hand.  HPI  History reviewed. No pertinent past medical history.  There are no active problems to display for this patient.   History reviewed. No pertinent surgical history.     Home Medications    Prior to Admission medications   Medication Sig Start Date End Date Taking? Authorizing Provider  amoxicillin (AMOXIL) 500 MG capsule Take 1 capsule (500 mg total) by mouth 3 (three) times daily. 04/20/13   Neva SeatGreene, Tiffany, [name]  diphenhydramine-acetaminophen (TYLENOL PM) 25-500 MG TABS Take 2 tablets by mouth at bedtime as needed (for sleep).    [provider]  ibuprofen (ADVIL,MOTRIN) 800 MG tablet Take 1 tablet (800 mg total) by mouth 3 (three) times daily. 05/21/14   Emilia BeckSzekalski, Kaitlyn, [name]  methocarbamol (ROBAXIN) 500 MG tablet Take 1 tablet (500 mg total) by mouth 4 (four) times daily. 03/13/14   Renne CriglerGeiple, Joshua, [name]  traMADol (ULTRAM) 50 MG tablet Take 1 tablet (50 mg total) by mouth every 6 (six) hours as needed. 03/13/14   Renne CriglerGeiple, Joshua, [name]    Family History No family history on file.  Social History Social History  Substance Use Topics  . Smoking status: Current Every Day Smoker   Packs/day: 0.50    Types: Cigarettes  . Smokeless tobacco: Never Used  . Alcohol use 3.0 oz/week    5 Cans of beer per week     Allergies   Patient has no known allergies.   Review of Systems Review of Systems  Constitutional: Negative for fever.  Musculoskeletal: Positive for arthralgias and joint swelling.  Skin: Negative for wound.  All other systems reviewed and are negative.    Physical Exam Updated Vital Signs BP (!) 160/103 (BP Location: Left Arm)   Pulse 80   Temp 98.1 F (36.7 C) (Oral)   Resp 18   Ht 5\' 4"  (1.626 m)   Wt 72.6 kg (160 lb)   SpO2 100%   BMI 27.46 kg/m   Physical Exam  Constitutional: He is oriented to person, place, and time. He appears well-developed and well-nourished. No distress.  HENT:  Head: Normocephalic and atraumatic.  Eyes: Pupils are equal, round, and reactive to light. Conjunctivae are normal. Right eye exhibits no discharge. Left eye exhibits no discharge. No scleral icterus.  Neck: Normal range of motion.  Cardiovascular: Normal rate.   Pulmonary/Chest: Effort normal. No respiratory distress.  Abdominal: He exhibits no distension.  Musculoskeletal:  Right upper extremity: Diffuse swelling of dorsal aspect of right hand. Pain with ROM of middle finger. Decreased ROM of fingers. N/V intact.   Neurological: He is alert and oriented to person, place, and time.  Skin:  Skin is warm and dry.  Psychiatric: He has a normal mood and affect. His behavior is normal.  Nursing note and vitals reviewed.    ED Treatments / Results  Labs (all labs ordered are listed, but only abnormal results are displayed) Labs Reviewed - No data to display  EKG  EKG Interpretation None       Radiology Dg Hand Complete Right  Result Date: 09/04/2016 CLINICAL DATA:  Initial evaluation for acute right hand pain with swelling. EXAM: RIGHT HAND - COMPLETE 3+ VIEW COMPARISON:  None. FINDINGS: No acute fracture or dislocation. Diffuse soft tissue  swelling noted at the hand, greatest at the dorsal and ulnar aspect. No radiopaque foreign body. IMPRESSION: 1. Diffuse soft tissue swelling about the right hand. 2. No acute osseous abnormality. Electronically Signed   By: Rise MuBenjamin  McClintock M.D.   On: 09/04/2016 05:18    Procedures Procedures (including critical care time)  SPLINT APPLICATION Date/Time: 09/04/16 7:00AM Authorized by: Bethel BornKelly Marie Gekas Consent: Verbal consent obtained. Risks and benefits: risks, benefits and alternatives were discussed Consent given by: patient Splint applied by: orthopedic technician Location details: right upper extremity Splint type: velcro wrist splint Supplies used: velcro wrist splint Post-procedure: The splinted body part was neurovascularly unchanged following the procedure. Patient tolerance: Patient tolerated the procedure well with no immediate complications.   Medications Ordered in ED Medications - No data to display   Initial Impression / Assessment and Plan / ED Course  I have reviewed the triage vital signs and the nursing notes.  Pertinent labs & imaging results that were available during my care of the patient were reviewed by me and considered in my medical decision making (see chart for details).  36 year old male with hand pain and swelling likely due to over use injury. Pt was seen and evaluated by Dr. Preston FleetingGlick. Will splint and advised rest, ice, NSAIDs, and f/u with hand surgery. Return precautions given.  Final Clinical Impressions(s) / ED Diagnoses   Final diagnoses:  Right hand pain  Localized swelling on right hand    New Prescriptions New Prescriptions   IBUPROFEN (ADVIL,MOTRIN) 600 MG TABLET    Take 1 tablet (600 mg total) by mouth every 6 (six) hours as needed.     Bethel BornGekas, Kelly Marie, [name] 09/04/16 [id number]    Dione BoozeGlick, David, MD 09/04/16 0657    Bethel BornGekas, Kelly Marie, [name] 09/16/16 1732    Dione BoozeGlick, David, MD 09/17/16 [phone]

## 2016-09-04 NOTE — ED Triage Notes (Signed)
C/o swelling and pain to right hand onset Monday then the swelling went down Tues states he work however Tues night swelling returned and hasn't gone down. Denies injury.

## 2016-09-04 NOTE — Discharge Instructions (Signed)
Rest - avoid using hand until swelling and pain improves Ice - ice for 20 minutes at a time, several times a day Elevate - elevate hand in air to reduce swelling  Ibuprofen - take with food. Take up to 3-4 times daily

## 2016-10-27 ENCOUNTER — Emergency Department (HOSPITAL_COMMUNITY)
Admission: EM | Admit: 2016-10-27 | Discharge: 2016-10-27 | Disposition: A | Discharge status: Home / Self Care | Payer: Self-pay | Attending: Emergency Medicine | Admitting: Emergency Medicine

## 2016-10-27 ENCOUNTER — Encounter (HOSPITAL_COMMUNITY): Payer: Self-pay | Admitting: Emergency Medicine

## 2016-10-27 DIAGNOSIS — M545 Low back pain, unspecified: Secondary | ICD-10-CM

## 2016-10-27 DIAGNOSIS — F17210 Nicotine dependence, cigarettes, uncomplicated: Secondary | ICD-10-CM | POA: Insufficient documentation

## 2016-10-27 MED ORDER — CYCLOBENZAPRINE HCL 10 MG PO TABS: 10.0000 mg | ORAL_TABLET | Freq: Two times a day (BID) | ORAL | 0 refills | Status: DC | PRN

## 2016-10-27 NOTE — ED Triage Notes (Signed)
Pt reports right lower back pain since Sunday after he worked, states he does a lot of heavy lifting at his job, denies any numbness or tingling, no radiation of pain and no urinary symptoms. Pt ambulatory to triage with no issues.

## 2016-10-27 NOTE — ED Provider Notes (Signed)
[facility] Provider Note   CSN: [id number] Arrival date & time: 10/27/16  0750     History   Chief Complaint Chief Complaint  Patient presents with  . Back Pain    HPI Phillip Mcconnell is a 36 y.o. male.  HPI   Phillip Mcconnell is a 36 year old male with no significant past medical history who presents to the emergency department for evaluation of lower right back pain which started 2 days ago. Patient states that he works at [employer] does a lot of lifting for his job. He states that he thinks he pulled his back while at work. His pain is in the lower right back and is an 8/10 in severity, aching in nature, and worsened with ambulation. He states that when he is sitting still his pain as 0/10. He has been taking ibuprofen and Tylenol for pain with little relief. His wife got him a back brace which has not been helping his symptoms. He denies numbness, weakness, saddle anesthesia, urinary retention, bowel incontinence, fever. He denies cancer history, denies IV drug use. He denies past surgery on his back.  History reviewed. No pertinent past medical history.  There are no active problems to display for this patient.   History reviewed. No pertinent surgical history.     Home Medications    Prior to Admission medications   Medication Sig Start Date End Date Taking? Authorizing Provider  cyclobenzaprine (FLEXERIL) 10 MG tablet Take 1 tablet (10 mg total) by mouth 2 (two) times daily as needed for muscle spasms. 10/27/16   Kellie Shropshire, [name]  diphenhydramine-acetaminophen (TYLENOL PM) 25-500 MG TABS Take 2 tablets by mouth at bedtime as needed (for sleep).    [provider]  ibuprofen (ADVIL,MOTRIN) 600 MG tablet Take 1 tablet (600 mg total) by mouth every 6 (six) hours as needed. 09/04/16   Dione Booze, MD    Family History No family history on file.  Social History Social History  Substance Use Topics  . Smoking status: Current Every Day Smoker   Packs/day: 0.50    Types: Cigarettes  . Smokeless tobacco: Never Used  . Alcohol use 3.0 oz/week    5 Cans of beer per week     Allergies   Patient has no known allergies.   Review of Systems Review of Systems  Constitutional: Negative for chills, fatigue and fever.  Gastrointestinal: Negative for abdominal pain, diarrhea, nausea and vomiting.  Genitourinary: Negative for decreased urine volume, difficulty urinating, dysuria, frequency and hematuria.  Musculoskeletal: Positive for back pain. Negative for gait problem, neck pain and neck stiffness.  Skin: Negative for rash and wound.  Neurological: Negative for weakness, numbness and headaches.     Physical Exam Updated Vital Signs BP (!) 148/93 (BP Location: Right Arm)   Pulse 69   Temp 98.1 F (36.7 C) (Oral)   Resp 17   SpO2 100%   Physical Exam  Constitutional: He is oriented to person, place, and time. He appears well-developed and well-nourished. No distress.  HENT:  Head: Normocephalic and atraumatic.  Eyes: Right eye exhibits no discharge. Left eye exhibits no discharge.  Pulmonary/Chest: Effort normal. No respiratory distress.  Abdominal: Soft. Bowel sounds are normal. There is no tenderness. There is no rebound and no guarding.  No CVA tenderness.  Musculoskeletal:  Tenderness to palpation over lumbar spine and right paraspinal muscles. No step-off appreciated or point tenderness in the lumbar spine. Full ROM of lumbar spine, although painful. Strength 5/5 in  bilateral lower extremities. Gait normal, patient is able to ambulate independently.  Neurological: He is alert and oriented to person, place, and time. Coordination normal.  Distal sensation to sharp/light touch intact in bilateral upper and lower extremities. Patellar and a achilles reflex 2+.  Skin: Skin is warm and dry. Capillary refill takes less than 2 seconds. He is not diaphoretic.  Psychiatric: He has a normal mood and affect. His behavior is  normal.  Nursing note and vitals reviewed.    ED Treatments / Results  Labs (all labs ordered are listed, but only abnormal results are displayed) Labs Reviewed - No data to display  EKG  EKG Interpretation None       Radiology No results found.  Procedures Procedures (including critical care time)  Medications Ordered in ED Medications - No data to display   Initial Impression / Assessment and Plan / ED Course  I have reviewed the triage vital signs and the nursing notes.  Pertinent labs & imaging results that were available during my care of the patient were reviewed by me and considered in my medical decision making (see chart for details).     Patient with back pain. No neurological deficits and normal neuro exam.  Patient can walk but states is painful. No loss of bowel or bladder control. No concern for cauda equina.  No fever, night sweats, weight loss, h/o cancer, IVDU. RICE protocol and pain medicine indicated and discussed with patient.   Patient is mildly hypertensive in the ER today. Have discussed this with patient and he agrees to follow-up at his primary care office for this. Have discussed return precautions and patient voices understanding.  Final Clinical Impressions(s) / ED Diagnoses   Final diagnoses:  Acute right-sided low back pain without sciatica    New Prescriptions New Prescriptions   CYCLOBENZAPRINE (FLEXERIL) 10 MG TABLET    Take 1 tablet (10 mg total) by mouth 2 (two) times daily as needed for muscle spasms.     Kellie Shropshire, [name] 10/27/16 1032    Tegeler, Canary Brim, MD 10/27/16 2030

## 2016-10-27 NOTE — Discharge Instructions (Signed)
Blood pressure was elevated in the ER today. Please make an appointment with [facility] to establish care and follow up on blood pressure.   Back Pain:  Your back pain should be treated with medicines such as ibuprofen or aleve and this back pain should get better over the next 2 weeks.  However if you develop severe or worsening pain, low back pain with fever, numbness, weakness or inability to walk or urinate, you should return to the ER immediately.  Please follow up with your doctor this week for a recheck if still having symptoms.  Low back pain is discomfort in the lower back that may be due to injuries to muscles and ligaments around the spine.  Occasionally, it may be caused by a a problem to a part of the spine called a disc.  The pain may last several days or a week;  However, most patients get completely well in 4 weeks.  Self - care:  The application of heat can help soothe the pain.  Maintaining your daily activities, including walking, is encourged, as it will help you get better faster than just staying in bed.  Medications are also useful to help with pain control.  A commonly prescribed medications includes acetaminophen.  This medication is generally safe, though you should not take more than 8 of the extra strength ( ) pills a day.  Non steroidal anti inflammatory medications including Ibuprofen and naproxen;  These medications help both pain and swelling and are very useful in treating back pain.  They should be taken with food, as they can cause stomach upset, and more seriously, stomach bleeding.    Muscle relaxants:  These medications can help with muscle tightness that is a cause of lower back pain.  Most of these medications can cause drowsiness, and it is not safe to drive or use dangerous machinery while taking them.

## 2016-12-29 ENCOUNTER — Emergency Department (HOSPITAL_COMMUNITY)
Admission: EM | Admit: 2016-12-29 | Discharge: 2016-12-29 | Disposition: A | Discharge status: Home / Self Care | Payer: Self-pay | Attending: Emergency Medicine | Admitting: Emergency Medicine

## 2016-12-29 ENCOUNTER — Other Ambulatory Visit: Payer: Self-pay

## 2016-12-29 ENCOUNTER — Encounter (HOSPITAL_COMMUNITY): Payer: Self-pay | Admitting: *Deleted

## 2016-12-29 DIAGNOSIS — F17210 Nicotine dependence, cigarettes, uncomplicated: Secondary | ICD-10-CM | POA: Insufficient documentation

## 2016-12-29 DIAGNOSIS — M545 Low back pain, unspecified: Secondary | ICD-10-CM

## 2016-12-29 DIAGNOSIS — I10 Essential (primary) hypertension: Secondary | ICD-10-CM | POA: Insufficient documentation

## 2016-12-29 DIAGNOSIS — G5602 Carpal tunnel syndrome, left upper limb: Secondary | ICD-10-CM | POA: Insufficient documentation

## 2016-12-29 MED ORDER — CYCLOBENZAPRINE HCL 10 MG PO TABS: 10.0000 mg | ORAL_TABLET | Freq: Every evening | ORAL | 0 refills | Status: DC | PRN

## 2016-12-29 NOTE — ED Triage Notes (Signed)
Pt having lower back pain for 3+months, does heavy lifting at work and no relief with ibuprofen.

## 2016-12-29 NOTE — ED Provider Notes (Signed)
[facility]   CSN: [id number] Arrival date & time: 12/29/16  [id number]     History   Chief Complaint Chief Complaint  Patient presents with  . Back Pain    HPI Phillip Mcconnell is a 36 y.o. male who presents today for evaluation of bilateral lower back pain.  He reports that this has been intermittent for the past 3+ months.  He does heavy lifting at work and feels like that hurts his back.  He reports that his back got worse last week and is causing him constant pain.  He reports that he feels like his muscles are tight.  He denies any personal history of cancer or IV drug use.  No changes to bowel or bladder function.  He denies any recent fevers or chills.  He has taken 200 mg of ibuprofen without significant relief.  He reports that in the past when he has had this Flexeril has helped him, however it makes him drowsy.  He also reports that for the past year he has had gradually worsening tingling in his left thumb, index finger, middle finger, and the radial side of his ring finger.  He denies any pain to this area.  He does not have any neck pain.  Denies injury at the onset.  HPI  History reviewed. No pertinent past medical history.  There are no active problems to display for this patient.   History reviewed. No pertinent surgical history.     Home Medications    Prior to Admission medications   Medication Sig Start Date End Date Taking? Authorizing Provider  cyclobenzaprine (FLEXERIL) 10 MG tablet Take 1 tablet (10 mg total) by mouth at bedtime as needed for muscle spasms. 12/29/16   Cristina GongHammond, Sigfredo Schreier W, [name]  diphenhydramine-acetaminophen (TYLENOL PM) 25-500 MG TABS Take 2 tablets by mouth at bedtime as needed (for sleep).    [provider]  ibuprofen (ADVIL,MOTRIN) 600 MG tablet Take 1 tablet (600 mg total) by mouth every 6 (six) hours as needed. 09/04/16   Dione BoozeGlick, David, MD    Family History History  reviewed. No pertinent family history.  Social History Social History   Tobacco Use  . Smoking status: Current Every Day Smoker    Packs/day: 0.50    Types: Cigarettes  . Smokeless tobacco: Never Used  Substance Use Topics  . Alcohol use: Yes    Alcohol/week: 3.0 oz    Types: 5 Cans of beer per week  . Drug use: No     Allergies   Patient has no known allergies.   Review of Systems Review of Systems  Gastrointestinal: Negative for constipation and diarrhea.  Genitourinary: Negative for difficulty urinating.  Musculoskeletal: Positive for back pain.  Neurological: Negative for weakness and numbness.     Physical Exam Updated Vital Signs BP (!) 179/101 (BP Location: Right Arm)   Pulse (!) 59   Temp 98.2 F (36.8 C) (Oral)   Resp 16   Ht 5\' 4"  (1.626 m)   Wt 71.7 kg (158 lb)   SpO2 100%   BMI 27.12 kg/m   Physical Exam  Constitutional: He is oriented to person, place, and time. He appears well-developed and well-nourished.  HENT:  Head: Normocephalic and atraumatic.  Cardiovascular:  2+ DP/PT pulses bilaterally.  2+ radial pulses bilaterally  Pulmonary/Chest: Effort normal.  Musculoskeletal:  5/5 strength to bilateral lower extremities including flexion/extension to ankles, knees, and hips.  Spine C/T/L: No midline tenderness, no  step-offs or deformities. Lumbar paraspinal muscles are tender to palpation bilaterally.  Lumbar paraspinal muscles feel tight consistent with spasm.  Palpation of lumbar paraspinal muscles both re-creates and exacerbates patient's reported pain.  Neurological: He is alert and oriented to person, place, and time.  Sensation intact and symmetrical to bilateral lower extremities. Patient has decreased sensation in the left thumb, index, middle, and the radial side of the ring finger.  This is worsened with percussion over the carpal tunnel.  Skin: Skin is warm and dry. He is not diaphoretic.  Psychiatric: He has a normal mood and  affect. His behavior is normal.  Nursing note and vitals reviewed.  BP in bilateral upper arms Left=160/98, Right =166/103  ED Treatments / Results  Labs (all labs ordered are listed, but only abnormal results are displayed) Labs Reviewed - No data to display  EKG  EKG Interpretation None       Radiology No results found.  Procedures Procedures (including critical care time)  Medications Ordered in ED Medications - No data to display   Initial Impression / Assessment and Plan / ED Course  I have reviewed the triage vital signs and the nursing notes.  Pertinent labs & imaging results that were available during my care of the patient were reviewed by me and considered in my medical decision making (see chart for details).    Patient with back pain.  No neurological deficits and normal neuro exam.  Patient can walk but states is painful.  No loss of bowel or bladder control.  No concern for cauda equina.  No fever, night sweats, weight loss, h/o cancer, IVDU.  RICE protocol and pain medicine indicated and discussed with patient.  Patient has tingling in the thumb, ring, middle, and radial side of ring finger on the left hand.  This tingling is made worse with percussion over the carpal tunnel and is consistent with carpal tunnel syndrome.     Final Clinical Impressions(s) / ED Diagnoses   Final diagnoses:  Hypertension, unspecified type  Bilateral low back pain without sciatica, unspecified chronicity  Carpal tunnel syndrome of left wrist    ED Discharge Orders        Ordered    cyclobenzaprine (FLEXERIL) 10 MG tablet  At bedtime PRN     12/29/16 1002       Cristina GongHammond, Kynslee Baham W, [place]PA-C 12/29/16 1012    Cathren LaineSteinl, Kevin, MD 12/29/16 1323

## 2016-12-29 NOTE — Discharge Instructions (Signed)
While in the ED your blood pressure was high.  Please follow up with your primary care doctor or the wellness clinic for repeat evaluation as you may need medication.  High blood pressure can cause long term, potentially serious, damage if left untreated.   Please take Ibuprofen (Advil, motrin) and Tylenol (acetaminophen) to relieve your pain.  You may take up to 600 MG (3 pills) of normal strength ibuprofen every 8 hours as needed.  In between doses of ibuprofen you make take tylenol, up to 1,000 mg (two extra strength pills).  Do not take more than 3,000 mg tylenol in a 24 hour period.  Please check all medication labels as many medications such as pain and cold medications may contain tylenol.  Do not drink alcohol while taking these medications.  Do not take other NSAID'S while taking ibuprofen (such as aleve or naproxen).  Please take ibuprofen with food to decrease stomach upset.   The best way to get rid of muscle pain is by taking NSAIDS, using heat, massage therapy, and gentle stretching/range of motion exercises.  You are being prescribed a medication which may make you sleepy. For 24 hours after one dose please do not drive, operate heavy machinery, care for a small child with out another adult present, or perform any activities that may cause harm to you or someone else if you were to fall asleep or be impaired.

## 2017-02-06 ENCOUNTER — Other Ambulatory Visit: Payer: Self-pay

## 2017-02-06 ENCOUNTER — Emergency Department (HOSPITAL_COMMUNITY)
Admission: EM | Admit: 2017-02-06 | Discharge: 2017-02-06 | Discharge status: Against Medical Advice | Payer: Self-pay | Attending: Emergency Medicine | Admitting: Emergency Medicine

## 2017-02-06 ENCOUNTER — Encounter (HOSPITAL_COMMUNITY): Payer: Self-pay | Admitting: Emergency Medicine

## 2017-02-06 DIAGNOSIS — M549 Dorsalgia, unspecified: Secondary | ICD-10-CM | POA: Insufficient documentation

## 2017-02-06 DIAGNOSIS — Z5321 Procedure and treatment not carried out due to patient leaving prior to being seen by health care provider: Secondary | ICD-10-CM | POA: Insufficient documentation

## 2017-02-06 NOTE — ED Notes (Signed)
Pt did not respond to name 

## 2017-02-06 NOTE — ED Notes (Signed)
Pt called to go back to [facility]7 but no answer in waiting room

## 2017-02-06 NOTE — ED Notes (Signed)
Pt called for [facility] in lobby but no answer

## 2017-02-06 NOTE — ED Triage Notes (Signed)
Pt c/o left lower back pain onset last night while at work, pt states he does repetative  lifting while at work.

## 2017-02-07 ENCOUNTER — Encounter (HOSPITAL_COMMUNITY): Payer: Self-pay | Admitting: Emergency Medicine

## 2017-02-07 DIAGNOSIS — M545 Low back pain: Secondary | ICD-10-CM | POA: Insufficient documentation

## 2017-02-07 DIAGNOSIS — Z5321 Procedure and treatment not carried out due to patient leaving prior to being seen by health care provider: Secondary | ICD-10-CM | POA: Insufficient documentation

## 2017-02-07 NOTE — ED Triage Notes (Signed)
Patient reports intermittent right low back pain onset last week , denies injury , pain increases with changing positions/bending , pt. stated heavy lifting at work , denies urinary discomfort or hematuria .

## 2017-02-08 ENCOUNTER — Emergency Department (HOSPITAL_COMMUNITY)
Admission: EM | Admit: 2017-02-08 | Discharge: 2017-02-08 | Discharge status: Against Medical Advice | Payer: Self-pay | Attending: Emergency Medicine | Admitting: Emergency Medicine

## 2017-02-09 ENCOUNTER — Encounter (HOSPITAL_COMMUNITY): Payer: Self-pay | Admitting: *Deleted

## 2017-02-09 ENCOUNTER — Other Ambulatory Visit: Payer: Self-pay

## 2017-02-09 ENCOUNTER — Emergency Department (HOSPITAL_COMMUNITY)
Admission: EM | Admit: 2017-02-09 | Discharge: 2017-02-09 | Disposition: A | Discharge status: Home / Self Care | Payer: Self-pay | Attending: Emergency Medicine | Admitting: Emergency Medicine

## 2017-02-09 DIAGNOSIS — Y990 Civilian activity done for income or pay: Secondary | ICD-10-CM | POA: Insufficient documentation

## 2017-02-09 DIAGNOSIS — Y9389 Activity, other specified: Secondary | ICD-10-CM | POA: Insufficient documentation

## 2017-02-09 DIAGNOSIS — Y929 Unspecified place or not applicable: Secondary | ICD-10-CM | POA: Insufficient documentation

## 2017-02-09 DIAGNOSIS — M545 Low back pain, unspecified: Secondary | ICD-10-CM

## 2017-02-09 DIAGNOSIS — F17210 Nicotine dependence, cigarettes, uncomplicated: Secondary | ICD-10-CM | POA: Insufficient documentation

## 2017-02-09 DIAGNOSIS — X509XXA Other and unspecified overexertion or strenuous movements or postures, initial encounter: Secondary | ICD-10-CM | POA: Insufficient documentation

## 2017-02-09 MED ORDER — METHOCARBAMOL 500 MG PO TABS: 500.0000 mg | ORAL_TABLET | Freq: Two times a day (BID) | ORAL | 0 refills | Status: DC

## 2017-02-09 MED ORDER — MELOXICAM 15 MG PO TABS: 15.0000 mg | ORAL_TABLET | Freq: Every day | ORAL | 0 refills | Status: DC

## 2017-02-09 MED ORDER — LIDO-CAPSAICIN-MEN-METHYL SAL 0.5-0.035-5-20 % EX PTCH: 1.0000 | MEDICATED_PATCH | Freq: Every day | CUTANEOUS | 0 refills | Status: DC

## 2017-02-09 NOTE — ED Triage Notes (Signed)
To ED for eval of lower back pain since Sunday. States he was in ED then but couldn't stay due to wait time. Pain increases with bending over. No pain radiation noted. No difficulty with urination or bowel movements. States this pain 'just messes up my performance at work'.

## 2017-02-09 NOTE — Discharge Instructions (Signed)

## 2017-02-09 NOTE — ED Provider Notes (Signed)
[facility] EMERGENCY DEPARTMENT Provider Note   CSN: [id number] Arrival date & time: 02/09/17  0745     History   Chief Complaint Chief Complaint  Patient presents with  . Back Pain    HPI Phillip Mcconnell is a 37 y.o. male with no significant past medical history who presents to the emergency department today for bilateral lower back pain that onset on Sunday.  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  He was at the end of his shift and bent down with a curved lower back to pick up a box he felt was light but underestimated the weight, causing a pulling sensation in his lower back when he lifted the box.  Since then he has been having dull, achy pains in his bilateral lower back without radiation that is exacerbated whenever he is bending down to pick something up.  He notes intermittent periods of time where he feels a "spasm" in his lower back that lasts for 30 seconds - 1 minute.  No pain at rest.  He has been taking ibuprofen for this with mild relief.  He has tried ice on one occasion but is unsure if it helped.  He is afraid that continuing to work is going to make his back pain worse. Denies history of cancer, trauma, fever, night pain, IV drug use, recent spinal manipulation or procedures, upper back pain or neck pain, numbness/tingling/weakness of the lower extremities, urinary retention, loss of bowel/bladder function, saddle anesthesia, or unexplained weight loss.  No urinary symptoms.   HPI  History reviewed. No pertinent past medical history.  There are no active problems to display for this patient.   History reviewed. No pertinent surgical history.     Home Medications    Prior to Admission medications   Medication Sig Start Date End Date Taking? Authorizing Provider  cyclobenzaprine (FLEXERIL) 10 MG tablet Take 1 tablet (10 mg total) by mouth at bedtime as needed for muscle spasms. 12/29/16   Cristina Gong, [name]  diphenhydramine-acetaminophen (TYLENOL PM) 25-500 MG TABS Take 2 tablets by mouth at bedtime as needed (for sleep).    [provider]  ibuprofen (ADVIL,MOTRIN) 600 MG tablet Take 1 tablet (600 mg total) by mouth every 6 (six) hours as needed. 09/04/16   Dione Booze, MD    Family History No family history on file.  Social History Social History   Tobacco Use  . Smoking status: Current Every Day Smoker    Packs/day: 0.00    Types: Cigarettes  . Smokeless tobacco: Never Used  Substance Use Topics  . Alcohol use: Yes    Alcohol/week: 3.0 oz    Types: 5 Cans of beer per week  . Drug use: No     Allergies   Patient has no known allergies.   Review of Systems Review of Systems  All other systems reviewed and are negative.    Physical Exam Updated Vital Signs BP (!) 156/87 (BP Location: Right Arm)   Pulse 81   Temp 98.5 F (36.9 C) (Oral)   Resp 18   SpO2 100%   Physical Exam  Constitutional: He appears well-developed and well-nourished. No distress.  Non-toxic appearing  HENT:  Head: Normocephalic and atraumatic.  Right Ear: External ear normal.  Left Ear: External ear normal.  Neck: Normal range of motion. Neck supple. No spinous process tenderness present. No neck rigidity. Normal range of motion present.  Cardiovascular: Normal rate,  regular rhythm, normal heart sounds and intact distal pulses.  No murmur heard. Pulses:      Radial pulses are 2+ on the right side, and 2+ on the left side.       Femoral pulses are 2+ on the right side, and 2+ on the left side.      Dorsalis pedis pulses are 2+ on the right side, and 2+ on the left side.       Posterior tibial pulses are 2+ on the right side, and 2+ on the left side.  Pulmonary/Chest: Effort normal and breath sounds normal. No respiratory distress.  Abdominal: Soft. Bowel sounds are normal. He exhibits no pulsatile midline mass. There is no tenderness. There is no rigidity, no rebound and no CVA  tenderness.  Musculoskeletal:       Right hip: Normal.       Left hip: Normal.  Posterior and appearance appears normal. No evidence of obvious scoliosis or kyphosis. No obvious signs of skin changes, trauma, deformity, infection. No C, T, or L spine tenderness or step-offs to palpation. No C or T paraspinal tenderness.  Bilateral paraspinal lumbar tenderness.  Lung expansion normal. Bilateral lower extremity strength 5 out of 5. Patellar and Achilles deep tendon reflex 2+ and equal bilaterally. Sensation of lower extremities grossly intact. Straight leg right neg. Straight leg left neg. Gait able but patient notes painful. Lower extremity compartments soft. PT and DP 2+ b/l. Cap refill <2 seconds.   Neurological: He is alert. He has normal strength. No sensory deficit. Gait normal.  Skin: Skin is warm, dry and intact. Capillary refill takes less than 2 seconds. No rash noted. He is not diaphoretic. No erythema.  Nursing note and vitals reviewed.    ED Treatments / Results  Labs (all labs ordered are listed, but only abnormal results are displayed) Labs Reviewed - No data to display  EKG  EKG Interpretation None       Radiology No results found.  Procedures Procedures (including critical care time)  Medications Ordered in ED Medications - No data to display   Initial Impression / Assessment and Plan / ED Course  I have reviewed the triage vital signs and the nursing notes.  Pertinent labs & imaging results that were available during my care of the patient were reviewed by me and considered in my medical decision making (see chart for details).     This is a 37 year old male presenting with atraumatic back pain after lifting a heavy box at work.  He is afebrile in the department. No neurological deficits and normal neuro exam.  No spinous tenderness or step-offs.  Patient can walk but states is painful.  No loss of bowel or bladder control.  No concern for cauda equina.  No  urinary symptoms.  No abdominal pain or pulsatile mass.  Doubt intra-abdominal pathology.  No fever, night sweats, weight loss, h/o cancer or  IVDU.  Suspect this is related to a low back strain.  Will treat with activity modification, back exercises, NSAIDs, lidocaine patches, muscle relaxers and follow-up with PCP.  Patient also noted to be slightly hypertensive in the emergency department today.  He is asymptomatic from this. He agrees to follow-up with PCP during follow-up appointment in order to discuss treatment for this.  Patient denied pain medication while in the department. Will write the patient off work for the following days.  Return precautions discussed.  Patient appears safe for discharge.    Final Clinical Impressions(s) /  ED Diagnoses   Final diagnoses:  Acute bilateral low back pain without sciatica    ED Discharge Orders        Ordered    Lido-Capsaicin-Men-Methyl Sal 0.5-0.035-5-20 % PT[facility]  Daily     02/09/17 0954    meloxicam (MOBIC) 15 MG tablet  Daily     02/09/17 0954    methocarbamol (ROBAXIN) 500 MG tablet  2 times daily     02/09/17 0954       Jacinto HalimMaczis, Nakari Bracknell M, [name] 02/09/17 [id number]    Rolland PorterJames, Mark, MD 02/15/17 [phone]

## 2017-03-08 ENCOUNTER — Encounter (HOSPITAL_COMMUNITY): Payer: Self-pay | Admitting: Emergency Medicine

## 2017-03-08 ENCOUNTER — Emergency Department (HOSPITAL_COMMUNITY)
Admission: EM | Admit: 2017-03-08 | Discharge: 2017-03-08 | Disposition: A | Discharge status: Home / Self Care | Payer: Self-pay | Attending: Emergency Medicine | Admitting: Emergency Medicine

## 2017-03-08 DIAGNOSIS — J301 Allergic rhinitis due to pollen: Secondary | ICD-10-CM | POA: Insufficient documentation

## 2017-03-08 DIAGNOSIS — R0981 Nasal congestion: Secondary | ICD-10-CM | POA: Insufficient documentation

## 2017-03-08 DIAGNOSIS — F17210 Nicotine dependence, cigarettes, uncomplicated: Secondary | ICD-10-CM | POA: Insufficient documentation

## 2017-03-08 DIAGNOSIS — X500XXA Overexertion from strenuous movement or load, initial encounter: Secondary | ICD-10-CM | POA: Insufficient documentation

## 2017-03-08 DIAGNOSIS — R067 Sneezing: Secondary | ICD-10-CM | POA: Insufficient documentation

## 2017-03-08 DIAGNOSIS — Y990 Civilian activity done for income or pay: Secondary | ICD-10-CM | POA: Insufficient documentation

## 2017-03-08 DIAGNOSIS — J302 Other seasonal allergic rhinitis: Secondary | ICD-10-CM

## 2017-03-08 DIAGNOSIS — S39012A Strain of muscle, fascia and tendon of lower back, initial encounter: Secondary | ICD-10-CM | POA: Insufficient documentation

## 2017-03-08 DIAGNOSIS — Y9389 Activity, other specified: Secondary | ICD-10-CM | POA: Insufficient documentation

## 2017-03-08 DIAGNOSIS — Z79899 Other long term (current) drug therapy: Secondary | ICD-10-CM | POA: Insufficient documentation

## 2017-03-08 DIAGNOSIS — Y9259 Other trade areas as the place of occurrence of the external cause: Secondary | ICD-10-CM | POA: Insufficient documentation

## 2017-03-08 MED ORDER — ACETAMINOPHEN 325 MG PO TABS: 650.0000 mg | ORAL_TABLET | Freq: Four times a day (QID) | ORAL | 0 refills | Status: DC | PRN

## 2017-03-08 MED ORDER — FLUTICASONE PROPIONATE 50 MCG/ACT NA SUSP: 2.0000 | Freq: Every day | NASAL | 0 refills | Status: DC

## 2017-03-08 MED ORDER — LORATADINE 10 MG PO TABS: 10.0000 mg | ORAL_TABLET | Freq: Every day | ORAL | 0 refills | Status: DC

## 2017-03-08 MED ORDER — NAPROXEN 375 MG PO TABS: 375.0000 mg | ORAL_TABLET | Freq: Two times a day (BID) | ORAL | 0 refills | Status: DC

## 2017-03-08 NOTE — Discharge Instructions (Addendum)
You can take naproxen and tylenol for the muscle pain. You may also take flonase and claritin for your allergy symptoms. Please follow up with your primary care doctor in 1 week for re-evaluation of your symptoms and to recheck your blood pressure. Please return to the ER for any new or worsening symptom including numbness/weakness to your legs, urinary or bowel incontinence, or inability to walk.

## 2017-03-08 NOTE — ED Provider Notes (Signed)
[facility] Provider [facility]   CSN: [id number] Arrival date & time: 03/08/17  2058     History   Chief Complaint Chief Complaint  Patient presents with  . URI  . Back Pain    HPI Phillip Mcconnell is a 37 y.o. male.  HPI   Pt is a 37 y/o male who presents to the ED c/o nonradiating bilateral lower back pain that began around 2:30am this morning while he was at work. States pain started when he was lifting up a box and pushed the box onto a shelf. States he took ibuprofen around 3:00am which improved his symptoms. States he has no back pain when he is not moving. When he moves it is an 8/10. Feels improved when he stretches/flexes his back. Has had similar back pain in the past and has been seen in the ED for this.  States it feels similar to when he previously had lumbar strain.   Denies numbness/tingling to BLE or saddle region, loss of control of bowels/bladder, or fevers. Denies a h/o IVDU. Denies a h/o cancer. No urinary retention.  Also c/o nasal congestion and sneezing beginning over the last few days.  Feels that this may be related to allergies.. No fevers, cough, ear pain, sore throat, chest pain, shortness of breath, or any other symptoms..   Discussed high blood pressure and pt states every time he comes to the ED he gets nervous and has high BP. States he has checked his BP at [facility] and it is usually lower than when it is measured here.  History reviewed. No pertinent past medical history.  There are no active problems to display for this patient.   History reviewed. No pertinent surgical history.     Home Medications    Prior to Admission medications   Medication Sig Start Date End Date Taking? Authorizing Provider  acetaminophen (TYLENOL) 325 MG tablet Take 2 tablets (650 mg total) by mouth every 6 (six) hours as needed. 03/08/17   Tityana Pagan S, [name]  cyclobenzaprine (FLEXERIL) 10 MG tablet Take 1 tablet (10 mg total)  by mouth at bedtime as needed for muscle spasms. 12/29/16   Cristina GongHammond, Elizabeth W, [name]  diphenhydramine-acetaminophen (TYLENOL PM) 25-500 MG TABS Take 2 tablets by mouth at bedtime as needed (for sleep).    [provider]  fluticasone (FLONASE) 50 MCG/ACT nasal spray Place 2 sprays into both nostrils daily. 03/08/17   Fue Cervenka S, [name]  ibuprofen (ADVIL,MOTRIN) 600 MG tablet Take 1 tablet (600 mg total) by mouth every 6 (six) hours as needed. 09/04/16   Dione BoozeGlick, David, MD  Lido-Capsaicin-Men-Methyl Sal 0.5-0.035-5-20 % [facility] Apply 1 patch topically daily. 02/09/17   [name], Elmer SowMichael M, [name]  loratadine (CLARITIN) 10 MG tablet Take 1 tablet (10 mg total) by mouth daily. 03/08/17   Acea Yagi S, [name]  meloxicam (MOBIC) 15 MG tablet Take 1 tablet (15 mg total) by mouth daily. 02/09/17   [name], Elmer SowMichael M, [name]  methocarbamol (ROBAXIN) 500 MG tablet Take 1 tablet (500 mg total) by mouth 2 (two) times daily. 02/09/17   [name], Elmer SowMichael M, [name]  naproxen (NAPROSYN) 375 MG tablet Take 1 tablet (375 mg total) by mouth 2 (two) times daily. 03/08/17   Tyffani Foglesong S, [name]    Family History No family history on file.  Social History Social History   Tobacco Use  . Smoking status: Current Every Day Smoker    Packs/day: 0.50    Types:  Cigarettes  . Smokeless tobacco: Never Used  Substance Use Topics  . Alcohol use: Yes    Alcohol/week: 3.0 oz    Types: 5 Cans of beer per week  . Drug use: No     Allergies   Patient has no known allergies.   Review of Systems Review of Systems  Constitutional: Negative for chills and fever.  HENT: Positive for congestion and sneezing. Negative for ear pain, rhinorrhea, sinus pressure, sinus pain, sore throat and trouble swallowing.   Eyes: Negative for pain and visual disturbance.  Respiratory: Negative for cough, shortness of breath and wheezing.   Cardiovascular: Negative for chest pain and palpitations.  Gastrointestinal: Negative for abdominal  pain, constipation, diarrhea, nausea and vomiting.       No loss of control of bowels  Genitourinary: Negative for dysuria, flank pain, frequency and hematuria.       No loss of control of bladder  Musculoskeletal: Positive for back pain. Negative for arthralgias, gait problem, neck pain and neck stiffness.  Skin: Negative for color change and rash.  Neurological: Negative for seizures and syncope.       No saddle anesthesia.  No lower extremity numbness/weakness  All other systems reviewed and are negative.    Physical Exam Updated Vital Signs BP (!) 134/94 (BP Location: Right Arm)   Pulse 77   Temp 98.4 F (36.9 C) (Oral)   Resp 18   Ht 5\' 5"  (1.651 m)   Wt 68 kg (150 lb)   SpO2 99%   BMI 24.96 kg/m   Physical Exam  Constitutional: He appears well-developed and well-nourished.  HENT:  Head: Normocephalic and atraumatic.  Bilateral nasal turbinates are boggy and patient sounds congested.  Bilateral TMs without erythema, but with bulging and clear fluid bilat.  Oropharynx clear and moist with no pharyngeal erythema or exudates.  No tonsillar swelling and no evidence of peritonsillar abscess.  Uvula midline.  Eyes: Conjunctivae and EOM are normal. Pupils are equal, round, and reactive to light.  Neck: Normal range of motion. Neck supple.  No nuchal rigidity  Cardiovascular: Normal rate, regular rhythm, normal heart sounds and intact distal pulses.  No murmur heard. Pulmonary/Chest: Effort normal and breath sounds normal. No stridor. No respiratory distress. He has no wheezes.  Abdominal: Soft. Bowel sounds are normal. He exhibits no distension. There is no tenderness.  Musculoskeletal: He exhibits no edema.  Tenderness to bilateral sciatic notches.  No midline tenderness.  Some paraspinous tenderness in the lumbar region with muscle spasm noted.  Patient has full range of motion with flexion-extension and bilateral rotation.   Neurological: He is alert.  Motor:  Normal tone.  5/5 strength of BUE and BLE major muscle groups including strong and equal grip strength and dorsiflexion/plantar flexion Sensory: light touch normal in all extremities. Gait: normal gait and balance. Able to walk on toes and heels with ease.  CV: 2+ radial and DP/PT pulses  Skin: Skin is warm and dry.  Psychiatric: He has a normal mood and affect.  Nursing note and vitals reviewed.    ED Treatments / Results  Labs (all labs ordered are listed, but only abnormal results are displayed) Labs Reviewed - No data to display  EKG  EKG Interpretation None       Radiology No results found.  Procedures Procedures (including critical care time)  Medications Ordered in ED Medications - No data to display   Initial Impression / Assessment and Plan / ED Course  I have  reviewed the triage vital signs and the nursing notes.  Pertinent labs & imaging results that were available during my care of the patient were reviewed by me and considered in my medical decision making (see chart for details).     Final Clinical Impressions(s) / ED Diagnoses   Final diagnoses:  Seasonal allergies  Strain of lumbar region, initial encounter   Patient is a 37 year old male presenting with complaints of bilateral lower back pain beginning after lifting heavy object a few days ago.  Patient has had lumbar strain similar to this in the past and states that it feels similar.  Tenderness to bilateral sciatic notches as well as bilateral paraspinous tenderness to lumbar spine with positive muscle spasm.  No neurological deficits and normal neuro exam.  Patient can walk.  No loss of bowel or bladder control.  No weakness/numbness to bilateral lower extremities.  No saddle anesthesia.  No concern for cauda equina.  No fever, night sweats, weight loss, h/o cancer, IVDU.  RICE protocol and pain medicine indicated and discussed with patient.   Patient also complaining of sneezing and nasal congestion for a few  days.  Consistent with allergic rhinitis.  Low concern for upper respiratory infection as patient has no sore throat, cough, shortness of breath, chest pain, or fevers.  Cardiac exam and pulmonary exam are within normal limits.  Will give symptomatic treatment with Flonase and Claritin.  Also discussed elevated blood pressure which patient states only happens when he comes to the hospital.  No chest pain or shortness of breath today and no evidence of endorgan damage.  No headaches.  Advised him to follow-up with PCP about all of his symptoms today including his mildly elevated blood pressure.  Gave strict return precautions.  Patient agrees to follow-up and return if worse.  All questions answered.  ED Discharge Orders        Ordered    naproxen (NAPROSYN) 375 MG tablet  2 times daily     03/08/17 2220    acetaminophen (TYLENOL) 325 MG tablet  Every 6 hours PRN     03/08/17 2220    fluticasone (FLONASE) 50 MCG/ACT nasal spray  Daily     03/08/17 2220    loratadine (CLARITIN) 10 MG tablet  Daily     03/08/17 2220       Karrie Meres, [name] 03/09/17 0326    Gerhard Munch, MD 03/11/17 1712

## 2017-03-08 NOTE — ED Triage Notes (Signed)
Pt reports ongoing back pain, states lifting more than 50 pounds at work. Denies urinary symptoms. Also c/o cold symptoms, sneezing

## 2017-03-08 NOTE — ED Notes (Signed)
D/c reviewed with patient. No further questions at this time 

## 2017-03-08 NOTE — ED Notes (Signed)
Patient pain of about 6 on a 0-10 scale. Stating that pain is more intense when he is moving aorund

## 2017-03-14 ENCOUNTER — Encounter (HOSPITAL_COMMUNITY): Payer: Self-pay | Admitting: *Deleted

## 2017-03-14 ENCOUNTER — Other Ambulatory Visit: Payer: Self-pay

## 2017-03-14 ENCOUNTER — Emergency Department (HOSPITAL_COMMUNITY)
Admission: EM | Admit: 2017-03-14 | Discharge: 2017-03-14 | Disposition: A | Discharge status: Home / Self Care | Payer: Medicaid Other | Attending: Emergency Medicine | Admitting: Emergency Medicine

## 2017-03-14 DIAGNOSIS — M5442 Lumbago with sciatica, left side: Secondary | ICD-10-CM | POA: Insufficient documentation

## 2017-03-14 DIAGNOSIS — F17210 Nicotine dependence, cigarettes, uncomplicated: Secondary | ICD-10-CM | POA: Insufficient documentation

## 2017-03-14 DIAGNOSIS — M5441 Lumbago with sciatica, right side: Secondary | ICD-10-CM | POA: Insufficient documentation

## 2017-03-14 DIAGNOSIS — Z79899 Other long term (current) drug therapy: Secondary | ICD-10-CM | POA: Insufficient documentation

## 2017-03-14 DIAGNOSIS — M545 Low back pain: Secondary | ICD-10-CM

## 2017-03-14 MED ORDER — CYCLOBENZAPRINE HCL 10 MG PO TABS: 10.0000 mg | ORAL_TABLET | Freq: Three times a day (TID) | ORAL | 0 refills | Status: DC | PRN

## 2017-03-14 MED ORDER — IBUPROFEN 800 MG PO TABS: 800.0000 mg | ORAL_TABLET | Freq: Three times a day (TID) | ORAL | 0 refills | Status: DC | PRN

## 2017-03-14 NOTE — ED Provider Notes (Signed)
[facility] EMERGENCY DEPARTMENT Provider Note   CSN: [id number] Arrival date & time: 2/[facility]11/19  1918     History   Chief Complaint Chief Complaint  Patient presents with  . Spasms    HPI Phillip Mcconnell is a 37 y.o. male with a hx of tobacco abuse who returns to the ED with return of muscle spasms yesterday after heavy lifting. Patient states Phillip Mcconnell is having intermittent discomfort to the lower back and to bilateral lower extremities that is only occurring with movement/twisting/turning. Rates pain a 10/10 at worst, not in any pain at present. Patient states worse with movement/heavy lifting, somewhat improved with motrin and resolved with rest. States hx of similar intermittently over past several months, states that hx of similar was relieved with Flexeril in the past. Denies numbness, tingling, weakness, incontinence to bowel/bladder, fever, chills, IV drug use, or hx of cancer.   HPI  History reviewed. No pertinent past medical history.  There are no active problems to display for this patient.   History reviewed. No pertinent surgical history.     Home Medications    Prior to Admission medications   Medication Sig Start Date End Date Taking? Authorizing Provider  acetaminophen (TYLENOL) 325 MG tablet Take 2 tablets (650 mg total) by mouth every 6 (six) hours as needed. 03/08/17   Couture, Cortni S, [name]  cyclobenzaprine (FLEXERIL) 10 MG tablet Take 1 tablet (10 mg total) by mouth at bedtime as needed for muscle spasms. 12/29/16   Cristina GongHammond, Elizabeth W, [name]  diphenhydramine-acetaminophen (TYLENOL PM) 25-500 MG TABS Take 2 tablets by mouth at bedtime as needed (for sleep).    [provider]  fluticasone (FLONASE) 50 MCG/ACT nasal spray Place 2 sprays into both nostrils daily. 03/08/17   Couture, Cortni S, [name]  ibuprofen (ADVIL,MOTRIN) 600 MG tablet Take 1 tablet (600 mg total) by mouth every 6 (six) hours as needed. 09/04/16   Dione BoozeGlick, David, MD    Lido-Capsaicin-Men-Methyl Sal 0.5-0.035-5-20 % [facility] Apply 1 patch topically daily. 02/09/17   [name], Elmer SowMichael M, [name]  loratadine (CLARITIN) 10 MG tablet Take 1 tablet (10 mg total) by mouth daily. 03/08/17   Couture, Cortni S, [name]  meloxicam (MOBIC) 15 MG tablet Take 1 tablet (15 mg total) by mouth daily. 02/09/17   [name], Elmer SowMichael M, [name]  methocarbamol (ROBAXIN) 500 MG tablet Take 1 tablet (500 mg total) by mouth 2 (two) times daily. 02/09/17   [name], Elmer SowMichael M, [name]  naproxen (NAPROSYN) 375 MG tablet Take 1 tablet (375 mg total) by mouth 2 (two) times daily. 03/08/17   Couture, Cortni S, [name]    Family History History reviewed. No pertinent family history.  Social History Social History   Tobacco Use  . Smoking status: Current Every Day Smoker    Packs/day: 0.50    Types: Cigarettes  . Smokeless tobacco: Never Used  Substance Use Topics  . Alcohol use: Yes    Alcohol/week: 3.0 oz    Types: 5 Cans of beer per week  . Drug use: No     Allergies   Patient has no known allergies.   Review of Systems Review of Systems  Constitutional: Negative for chills and fever.  Cardiovascular: Negative for leg swelling.  Gastrointestinal: Negative for abdominal pain.  Musculoskeletal: Positive for back pain and myalgias. Negative for joint swelling.  Neurological: Negative for weakness and numbness.       Negative for incontinence or saddle anesthesia   Physical Exam Updated Vital Signs BP Marland Kitchen(!)  154/95   Pulse 76   Temp 98.7 F (37.1 C) (Oral)   Resp 18   SpO2 99%   Physical Exam  Constitutional: Phillip Mcconnell appears well-developed and well-nourished. No distress.  HENT:  Head: Normocephalic and atraumatic.  Eyes: Conjunctivae are normal. Right eye exhibits no discharge. Left eye exhibits no discharge.  Cardiovascular:  Pulses:      Posterior tibial pulses are 2+ on the right side, and 2+ on the left side.  Musculoskeletal: Normal range of motion. Phillip Mcconnell exhibits no edema, tenderness or  deformity.  No obvious deformity, appreciable swelling, erythema, or ecchymosis.  Back: No midline or paraspinal muscle tenderness to palpation Lower Extremities: Full ROM at all joints. Non tender.    Neurological: Phillip Mcconnell is alert.  Clear speech.  Bilateral upper and lower extremities' sensation intact to sharp and dull touch. 5/5 grip strength bilaterally. 5/5 plantar and dorsi flexion bilaterally. Patellar DTRs are 2+ and symmetric .Gait is normal.  Psychiatric: Phillip Mcconnell has a normal mood and affect. His behavior is normal. Thought content normal.  Nursing note and vitals reviewed.   ED Treatments / Results  Labs (all labs ordered are listed, but only abnormal results are displayed) Labs Reviewed - No data to display  EKG  EKG Interpretation None       Radiology No results found.  Procedures Procedures (including critical care time)  Medications Ordered in ED Medications - No data to display   Initial Impression / Assessment and Plan / ED Course  I have reviewed the triage vital signs and the nursing notes.  Pertinent labs & imaging results that were available during my care of the patient were reviewed by me and considered in my medical decision making (see chart for details).   Patient presents with back and lower extremity muscle spasms that are resolved at present. Patient is nontoxic appearing, in no apparent distress, vitals WNL other than elevated BP- no indication of HTN emergency, discussed need for recheck with patient. Patient has a completely benign physical exam- there are no areas of tenderness.  No neurological deficits and normal neuro exam.  Patient is able to ambulate. No loss of bowel or bladder control.  No concern for cauda equina.  No fever, night sweats, weight loss, h/o cancer, IVDU.  No lower extremity edema or tenderness, doubt DVT. Will treat with Flexeril and Ibuprofen- discussed patient is not to drive or operate heavy machinery when taking Flexeril. I  discussed treatment plan, need for PCP follow-up, and return precautions with the patient. Provided opportunity for questions, patient confirmed understanding and is in agreement with plan.   Final Clinical Impressions(s) / ED Diagnoses   Final diagnoses:  Acute bilateral low back pain, with sciatica presence unspecified    ED Discharge Orders        Ordered    cyclobenzaprine (FLEXERIL) 10 MG tablet  3 times daily PRN     03/14/17 2347    ibuprofen (ADVIL,MOTRIN) 800 MG tablet  Every 8 hours PRN     03/14/17 2347       Rogen Porte, Pleas Koch, [name] 03/14/17 2351    Margarita Grizzle, MD 03/15/17 [phone]

## 2017-03-14 NOTE — ED Triage Notes (Signed)
Pt is having worsening muscle cramps in legs and backs; is prescribed muscle relaxers but has not been able to take medication due to working late. Also reports working longer hours the past few days

## 2017-03-14 NOTE — Discharge Instructions (Signed)
I have prescribed you an anti-inflammatory medication and a muscle relaxer.   Ibuprofen 800mg - this is a nonsteroidal anti-inflammatory medication that will help with pain and swelling. Be sure to take this medication as prescribed with food, 1 pill every 8 hours as needed,  It should be taken with food, as it can cause stomach upset, and more seriously, stomach bleeding. Do not take other nonsteroidal anti-inflammatory medications with this such as Advil, Mobic, Naproxen, or Aleve with this.   Flexeril - this is the muscle relaxer I have prescribed, this is meant to help with muscle tightness. Be aware that this medication may make you drowsy therefore the first time you take this it should be at a time you are in an environment where you can rest. Do not drive or operate heavy machinery when taking this medication.   In addition you may also take Tylenol. Tylenol is generally safe, though you should not take more than 8 of the extra strength (500mg ) pills a day.  The application of heat can help soothe the pain.  Maintaining your daily activities, including walking, is encourged, as it will help you get better faster than just staying in bed.  Your pain should get better over the next 2 weeks.  You will need to follow up with  Your primary healthcare provider in 1-2 weeks for reassessment, if you do not have a primary care provider one is provided in your discharge instructions. However if you develop severe or worsening pain, low back pain with fever, numbness, weakness, loss of bowel or bladder control, or inability to walk or urinate, you should return to the ER immediately.  Please follow up with your doctor this week for a recheck if still having symptoms.

## 2017-04-02 ENCOUNTER — Encounter (HOSPITAL_COMMUNITY): Payer: Self-pay

## 2017-04-02 ENCOUNTER — Other Ambulatory Visit: Payer: Self-pay

## 2017-04-02 ENCOUNTER — Emergency Department (HOSPITAL_COMMUNITY)
Admission: EM | Admit: 2017-04-02 | Discharge: 2017-04-03 | Disposition: A | Discharge status: Home / Self Care | Payer: Medicaid Other | Attending: Emergency Medicine | Admitting: Emergency Medicine

## 2017-04-02 DIAGNOSIS — G479 Sleep disorder, unspecified: Secondary | ICD-10-CM

## 2017-04-02 DIAGNOSIS — Z79899 Other long term (current) drug therapy: Secondary | ICD-10-CM | POA: Insufficient documentation

## 2017-04-02 DIAGNOSIS — K2900 Acute gastritis without bleeding: Secondary | ICD-10-CM

## 2017-04-02 DIAGNOSIS — F17210 Nicotine dependence, cigarettes, uncomplicated: Secondary | ICD-10-CM | POA: Insufficient documentation

## 2017-04-02 LAB — CBC
HCT: 38.9 % — ABNORMAL LOW (ref 39.0–52.0)
HEMOGLOBIN: 13.2 g/dL (ref 13.0–17.0)
MCH: 25 pg — ABNORMAL LOW (ref 26.0–34.0)
MCHC: 33.9 g/dL (ref 30.0–36.0)
MCV: 73.7 fL — ABNORMAL LOW (ref 78.0–100.0)
Platelets: 268 10*3/uL (ref 150–400)
RBC: 5.28 MIL/uL (ref 4.22–5.81)
RDW: 13.5 % (ref 11.5–15.5)
WBC: 8.3 10*3/uL (ref 4.0–10.5)

## 2017-04-02 LAB — COMPREHENSIVE METABOLIC PANEL
ALK PHOS: 58 U/L (ref 38–126)
ALT: 17 U/L (ref 17–63)
ANION GAP: 11 (ref 5–15)
AST: 35 U/L (ref 15–41)
Albumin: 4 g/dL (ref 3.5–5.0)
BUN: 17 mg/dL (ref 6–20)
CALCIUM: 9 mg/dL (ref 8.9–10.3)
CO2: 22 mmol/L (ref 22–32)
Chloride: 105 mmol/L (ref 101–111)
Creatinine, Ser: 1.17 mg/dL (ref 0.61–1.24)
GFR calc Af Amer: >60 mL/min (ref >60)
Glucose, Bld: 88 mg/dL (ref 65–99)
Potassium: 4.3 mmol/L (ref 3.5–5.1)
Sodium: 138 mmol/L (ref 135–145)
TOTAL PROTEIN: 6.9 g/dL (ref 6.5–8.1)
Total Bilirubin: 0.6 mg/dL (ref 0.3–1.2)

## 2017-04-02 LAB — URINALYSIS, ROUTINE W REFLEX MICROSCOPIC
BILIRUBIN URINE: NEGATIVE
Glucose, UA: NEGATIVE mg/dL
Hgb urine dipstick: NEGATIVE
Ketones, ur: NEGATIVE mg/dL
Leukocytes, UA: NEGATIVE
NITRITE: NEGATIVE
Protein, ur: NEGATIVE mg/dL
SPECIFIC GRAVITY, URINE: 1.024 (ref 1.005–1.030)
pH: 5 (ref 5.0–8.0)

## 2017-04-02 LAB — LIPASE, BLOOD: Lipase: 51 U/L (ref 11–51)

## 2017-04-02 MED ORDER — FAMOTIDINE 20 MG PO TABS: 20.0000 mg | ORAL_TABLET | Freq: Two times a day (BID) | ORAL | 0 refills | Status: DC

## 2017-04-02 MED ORDER — FAMOTIDINE 20 MG PO TABS
20.0000 mg | ORAL_TABLET | Freq: Once | ORAL | Status: AC
  Administered 2017-04-02: 20 mg via ORAL
  Filled 2017-04-02: qty 1

## 2017-04-02 MED ORDER — SUCRALFATE 1 G PO TABS: 1.0000 g | ORAL_TABLET | Freq: Three times a day (TID) | ORAL | 0 refills | Status: DC

## 2017-04-02 MED ORDER — SUCRALFATE 1 G PO TABS
1.0000 g | ORAL_TABLET | Freq: Once | ORAL | Status: AC
  Administered 2017-04-02: 1 g via ORAL
  Filled 2017-04-02: qty 1

## 2017-04-02 NOTE — ED Triage Notes (Signed)
Pt endorses abd pain after taking 800mg  ibuprofen. Pt states "I don't think I ate enough before taking it and it upset my stomach" Pt last took ibuprofen at 1945 before going to work. Pt just started back to taking ibuprofen yesterday. VSS.

## 2017-04-02 NOTE — Discharge Instructions (Addendum)
Take the medications as prescribed.  Try to make sure you have something on your stomach when taking the ibuprofen.  If you need to break up the dosing to 400mg  at a time that is ok to do. Try to watch spicy food intake, this can make your pain worse. For sleep, can try melatonin or unisom (you can use store brand) Return to the ED for new or worsening symptoms.

## 2017-04-02 NOTE — ED Provider Notes (Signed)
[facility] EMERGENCY DEPARTMENT Provider Note   CSN: [id number] Arrival date & time: 04/02/17  2129     History   Chief Complaint Chief Complaint  Patient presents with  . Abdominal Pain    HPI COLA HIGHFILL is a 37 y.o. male.  The history is provided by the patient and medical records.  Abdominal Pain   Associated symptoms include nausea.    37 y.o. M here with abdominal pain.  Apparently he recently started taking ibuprofen again for his back and took 800mg  tablet today on an empty stomach prior to going into work around 7:45 PM.  He only ate a piece of bread with this.  States initially he felt ok but around 8:30 he had some deep, searing pain in his left upper abdomen with an associated burning sensation.  States he did eat some spicy food from [employer] shortly after which seemed to make it worse.  States he felt nauseated but denies vomiting or diarrhea.  No fever/chills.    Patient also with some sleep issues. 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  States he usually doesn't get tired until around 6pm, almost the time he has to go back to work.  States he has not been taking anything to help with sleep but is functioning on about 2 hours of sleep per day which is not enough.  History reviewed. No pertinent past medical history.  There are no active problems to display for this patient.   History reviewed. No pertinent surgical history.     Home Medications    Prior to Admission medications   Medication Sig Start Date End Date Taking? Authorizing Provider  acetaminophen (TYLENOL) 325 MG tablet Take 2 tablets (650 mg total) by mouth every 6 (six) hours as needed. 03/08/17   Couture, Cortni S, [name]  cyclobenzaprine (FLEXERIL) 10 MG tablet Take 1 tablet (10 mg total) by mouth 3 (three) times daily as needed for muscle spasms. 03/14/17   [name], [name], [name]  diphenhydramine-acetaminophen (TYLENOL PM) 25-500 MG TABS Take 2 tablets by mouth at  bedtime as needed (for sleep).    [provider]  fluticasone (FLONASE) 50 MCG/ACT nasal spray Place 2 sprays into both nostrils daily. 03/08/17   Couture, Cortni S, [name]  ibuprofen (ADVIL,MOTRIN) 800 MG tablet Take 1 tablet (800 mg total) by mouth every 8 (eight) hours as needed for moderate pain. 03/14/17   [name], [name], [name]  Lido-Capsaicin-Men-Methyl Sal 0.5-0.035-5-20 % PTCH Apply 1 patch topically daily. 02/09/17   [name], [name], [name]  loratadine (CLARITIN) 10 MG tablet Take 1 tablet (10 mg total) by mouth daily. 03/08/17   Couture, Cortni S, [name]    Family History History reviewed. No pertinent family history.  Social History Social History   Tobacco Use  . Smoking status: Current Every Day Smoker    Packs/day: 0.50    Types: Cigarettes  . Smokeless tobacco: Never Used  Substance Use Topics  . Alcohol use: Yes    Alcohol/week: 3.0 oz    Types: 5 Cans of beer per week    Comment: occ  . Drug use: No     Allergies   Patient has no known allergies.   Review of Systems Review of Systems  Gastrointestinal: Positive for abdominal pain and nausea.  All other systems reviewed and are negative.    Physical Exam Updated Vital Signs BP (!) 150/104 (BP Location: Right Arm)   Pulse 70   Temp 98.6 F (  37 C) (Oral)   Resp 16   Ht 5\' 5"  (1.651 m)   Wt 68 kg (150 lb)   SpO2 99%   BMI 24.96 kg/m   Physical Exam  Constitutional: He is oriented to person, place, and time. He appears well-developed and well-nourished.  HENT:  Head: Normocephalic and atraumatic.  Mouth/Throat: Oropharynx is clear and moist.  Eyes: Conjunctivae and EOM are normal. Pupils are equal, round, and reactive to light.  Neck: Normal range of motion.  Cardiovascular: Normal rate, regular rhythm and normal heart sounds.  Pulmonary/Chest: Effort normal and breath sounds normal.  Abdominal: Soft. Bowel sounds are normal. There is no tenderness. There is no rigidity and no guarding.    Musculoskeletal: Normal range of motion.  Neurological: He is alert and oriented to person, place, and time.  Skin: Skin is warm and dry.  Psychiatric: He has a normal mood and affect.  Nursing note and vitals reviewed.    ED Treatments / Results  Labs (all labs ordered are listed, but only abnormal results are displayed) Labs Reviewed  CBC - Abnormal; Notable for the following components:      Result Value   HCT 38.9 (*)    MCV 73.7 (*)    MCH 25.0 (*)    All other components within normal limits  LIPASE, BLOOD  COMPREHENSIVE METABOLIC PANEL  URINALYSIS, ROUTINE W REFLEX MICROSCOPIC    EKG  EKG Interpretation None       Radiology No results found.  Procedures Procedures (including critical care time)  Medications Ordered in ED Medications - No data to display   Initial Impression / Assessment and Plan / ED Course  I have reviewed the triage vital signs and the nursing notes.  Pertinent labs & imaging results that were available during my care of the patient were reviewed by me and considered in my medical decision making (see chart for details).  37 year old male presenting to the ED with left upper abdominal pain after taking Motrin on a nearly empty stomach.  He recently started taking this again due to some issues with his back.  Reports gnawing pain with some intermittent burning sensations.  [place] this which made it worse.  He is afebrile and nontoxic in appearance here.  Abdomen is soft and benign.  He has had some nausea but no vomiting.  Screening labs are overall reassuring.  Suspect he likely has a gastritis.  We discussed reducing his doses of Motrin, add Pepcid and Carafate temporarily.  Limit spicy/acidic foods from the diet.  In regards to his sleep, recommended over-the-counter Unisom and/or melatonin to help regulate his sleep.  Can follow-up with PCP if any ongoing issues.  Discussed plan with  patient, he acknowledged understanding and agreed with plan of care.  Return precautions given for new or worsening symptoms.  Final Clinical Impressions(s) / ED Diagnoses   Final diagnoses:  Other acute gastritis without hemorrhage  Trouble in sleeping    ED Discharge Orders        Ordered    famotidine (PEPCID) 20 MG tablet  2 times daily     04/02/17 2353    sucralfate (CARAFATE) 1 g tablet  3 times daily with meals & bedtime     04/02/17 2353       [name], Danarius Mcconathy M, [name] 04/03/17 Addison [id number]    Azalia Bilisampos, Kevin, MD 04/03/17 [phone]

## 2017-05-11 ENCOUNTER — Emergency Department (HOSPITAL_COMMUNITY)
Admission: EM | Admit: 2017-05-11 | Discharge: 2017-05-11 | Disposition: A | Discharge status: Home / Self Care | Payer: Medicaid Other | Attending: Emergency Medicine | Admitting: Emergency Medicine

## 2017-05-11 ENCOUNTER — Encounter (HOSPITAL_COMMUNITY): Payer: Self-pay | Admitting: Emergency Medicine

## 2017-05-11 DIAGNOSIS — M545 Low back pain, unspecified: Secondary | ICD-10-CM

## 2017-05-11 DIAGNOSIS — F17210 Nicotine dependence, cigarettes, uncomplicated: Secondary | ICD-10-CM | POA: Insufficient documentation

## 2017-05-11 DIAGNOSIS — Z79899 Other long term (current) drug therapy: Secondary | ICD-10-CM | POA: Insufficient documentation

## 2017-05-11 MED ORDER — METHOCARBAMOL 500 MG PO TABS: 500.0000 mg | ORAL_TABLET | Freq: Two times a day (BID) | ORAL | 0 refills | Status: DC

## 2017-05-11 NOTE — Discharge Instructions (Addendum)
Please read attached information. If you experience any new or worsening signs or symptoms please return to the emergency room for evaluation. Please follow-up with your primary care provider or specialist as discussed. Please use medication prescribed only as directed and discontinue taking if you have any concerning signs or symptoms.   °

## 2017-05-11 NOTE — ED Triage Notes (Signed)
Pt to ER for evaluation of right lower lumbar back pain onset after lifting heavy objects while moving with a friend. Pt ambulatory. Denies radiation. Pt in NAD.

## 2017-05-11 NOTE — ED Provider Notes (Signed)
[facility] Provider Note   CSN: [id number] Arrival date & time: 05/11/17  1815     History   Chief Complaint Chief Complaint  Patient presents with  . Back Pain   HPI Phillip Mcconnell is a 37 y.o. male.  HPI   37 year old male presents today with complaints of back pain.  Patient notes that this morning after getting off work he was helping a friend lift heavy objects.  He notes pain to his right lower lumbar musculature.  He denies any abdominal pain change in bowel or bladder habits, numbness tingling weakening, or fever.  He denies any other complaints.  He reports taking 400 mg of ibuprofen today.  Patient has history of intermittent back pain but no chronic baseline back pain.    History reviewed. No pertinent past medical history.  There are no active problems to display for this patient.   History reviewed. No pertinent surgical history.      Home Medications    Prior to Admission medications   Medication Sig Start Date End Date Taking? Authorizing Provider  acetaminophen (TYLENOL) 325 MG tablet Take 2 tablets (650 mg total) by mouth every 6 (six) hours as needed. 03/08/17   Couture, Cortni S, [name]  cyclobenzaprine (FLEXERIL) 10 MG tablet Take 1 tablet (10 mg total) by mouth 3 (three) times daily as needed for muscle spasms. 03/14/17   [name], [name], [name]  diphenhydramine-acetaminophen (TYLENOL PM) 25-500 MG TABS Take 2 tablets by mouth at bedtime as needed (for sleep).    [provider]  famotidine (PEPCID) 20 MG tablet Take 1 tablet (20 mg total) by mouth 2 (two) times daily. 04/02/17   [name], [name], [name]  fluticasone (FLONASE) 50 MCG/ACT nasal spray Place 2 sprays into both nostrils daily. 03/08/17   Couture, Cortni S, [name]  ibuprofen (ADVIL,MOTRIN) 800 MG tablet Take 1 tablet (800 mg total) by mouth every 8 (eight) hours as needed for moderate pain. 03/14/17   [name], [name], [name]    Lido-Capsaicin-Men-Methyl Sal 0.5-0.035-5-20 % PTCH Apply 1 patch topically daily. 02/09/17   [name], Elmer SowMichael M, [name]  loratadine (CLARITIN) 10 MG tablet Take 1 tablet (10 mg total) by mouth daily. 03/08/17   Couture, Cortni S, [name]  methocarbamol (ROBAXIN) 500 MG tablet Take 1 tablet (500 mg total) by mouth 2 (two) times daily. 05/11/17   Regla Fitzgibbon, Tinnie GensJeffrey, [name]  sucralfate (CARAFATE) 1 g tablet Take 1 tablet (1 g total) by mouth 4 (four) times daily -  with meals and at bedtime. 04/02/17   [name], [name], [name]    Family History History reviewed. No pertinent family history.  Social History Social History   Tobacco Use  . Smoking status: Current Every Day Smoker    Packs/day: 0.50    Types: Cigarettes  . Smokeless tobacco: Never Used  Substance Use Topics  . Alcohol use: Yes    Alcohol/week: 3.0 oz    Types: 5 Cans of beer per week    Comment: occ  . Drug use: No     Allergies   Patient has no known allergies.   Review of Systems Review of Systems  All other systems reviewed and are negative.    Physical Exam Updated Vital Signs BP (!) 148/94 (BP Location: Right Arm)   Pulse 79   Temp 99.3 F (37.4 C) (Oral)   Resp 16   SpO2 98%   Physical Exam  Constitutional: He is oriented to person, place, and  time. He appears well-developed and well-nourished. No distress.  HENT:  Head: Normocephalic and atraumatic.  Eyes: Pupils are equal, round, and reactive to light. Conjunctivae are normal. Right eye exhibits no discharge. Left eye exhibits no discharge. No scleral icterus.  Neck: Normal range of motion. Neck supple. No JVD present. No tracheal deviation present.  Pulmonary/Chest: Effort normal. No stridor.  Musculoskeletal: Normal range of motion. He exhibits tenderness. He exhibits no edema.  No C, T, or L spine tenderness to palpation. No obvious signs of trauma, deformity, infection, step-offs. Lung expansion normal. No scoliosis or kyphosis. Bilateral lower extremity  strength 5 out of 5, sensation grossly intact  Straight leg negative  Tenderness palpation of right lateral lumbar musculature     Neurological: He is alert and oriented to person, place, and time. Coordination normal.  Skin: Skin is warm and dry. He is not diaphoretic.  Psychiatric: He has a normal mood and affect. His behavior is normal. Judgment and thought content normal.  Nursing note and vitals reviewed.    ED Treatments / Results  Labs (all labs ordered are listed, but only abnormal results are displayed) Labs Reviewed - No data to display  EKG None  Radiology No results found.  Procedures Procedures (including critical care time)  Medications Ordered in ED Medications - No data to display   Initial Impression / Assessment and Plan / ED Course  I have reviewed the triage vital signs and the nursing notes.  Pertinent labs & imaging results that were available during my care of the patient were reviewed by me and considered in my medical decision making (see chart for details).       Final Clinical Impressions(s) / ED Diagnoses   Final diagnoses:  Acute right-sided low back pain without sciatica   Labs:   Imaging:  Consults:  Therapeutics:  Discharge Meds: robaxin   Assessment/Plan: 37 year old male presents today with likely muscular back pain.  He has no red flags here today.  Well-appearing in no acute distress.  Discharged with muscle relaxers and encouragement use Tylenol at home.  Return precautions given.  Patient verbalized understanding and agreement to today's plan had no further questions or concerns at the time of discharge.      ED Discharge Orders        Ordered    methocarbamol (ROBAXIN) 500 MG tablet  2 times daily     05/11/17 2052       Eyvonne Mechanic, Cordelia Poche 05/11/17 2055    Mancel Bale, MD 05/12/17 [phone]

## 2017-05-11 NOTE — ED Notes (Signed)
Patient verbalizes understanding of discharge instructions. Opportunity for questioning and answers were provided. Armband removed by staff, pt discharged from ED ambulatory.   

## 2017-05-18 ENCOUNTER — Emergency Department (HOSPITAL_COMMUNITY): Payer: Medicaid Other

## 2017-05-18 ENCOUNTER — Emergency Department (HOSPITAL_COMMUNITY)
Admission: EM | Admit: 2017-05-18 | Discharge: 2017-05-18 | Disposition: A | Discharge status: Home / Self Care | Payer: Medicaid Other | Attending: Emergency Medicine | Admitting: Emergency Medicine

## 2017-05-18 ENCOUNTER — Encounter (HOSPITAL_COMMUNITY): Payer: Self-pay

## 2017-05-18 ENCOUNTER — Other Ambulatory Visit: Payer: Self-pay

## 2017-05-18 DIAGNOSIS — Z79899 Other long term (current) drug therapy: Secondary | ICD-10-CM | POA: Insufficient documentation

## 2017-05-18 DIAGNOSIS — R05 Cough: Secondary | ICD-10-CM | POA: Diagnosis present

## 2017-05-18 DIAGNOSIS — B349 Viral infection, unspecified: Secondary | ICD-10-CM | POA: Diagnosis not present

## 2017-05-18 DIAGNOSIS — F17210 Nicotine dependence, cigarettes, uncomplicated: Secondary | ICD-10-CM | POA: Diagnosis not present

## 2017-05-18 DIAGNOSIS — J069 Acute upper respiratory infection, unspecified: Secondary | ICD-10-CM | POA: Insufficient documentation

## 2017-05-18 MED ORDER — GUAIFENESIN-DM 100-10 MG/5ML PO SYRP: 5.0000 mL | ORAL_SOLUTION | ORAL | 0 refills | Status: DC | PRN

## 2017-05-18 MED ORDER — SALINE SPRAY 0.65 % NA SOLN: 1.0000 | NASAL | 0 refills | Status: DC | PRN

## 2017-05-18 NOTE — Discharge Instructions (Signed)
As discussed, make sure that you stay well-hydrated and get some rest.  Thank your cough medication as needed, Tylenol or ibuprofen for pain and fever and nasal spray to help with congestion.  Follow-up with your primary care provider. Return if symptoms worsen or new concerning symptoms in the meantime.

## 2017-05-18 NOTE — ED Triage Notes (Signed)
Pt endorses URI sx x 3 days. Afebrile, VSS.

## 2017-05-18 NOTE — ED Notes (Signed)
Patient transported to X-ray 

## 2017-05-18 NOTE — ED Provider Notes (Signed)
[facility] EMERGENCY DEPARTMENT Provider Note   CSN: [id number] Arrival date & time: 05/18/17  1403     History   Chief Complaint Chief Complaint  Patient presents with  . URI    HPI Phillip Mcconnell is a 37 y.o. male with no significant past medical history presenting with 3 days of cough, congestion and pain in the chest when coughing, headache and decreased appetite.  He reports that he has not been sleeping well since he started working third shift. Denies known ill contacts but works in the public at [employer].  He has tried Mucinex and NyQuil without relief.  No Fever, chills, nausea, vomiting, diarrhea, abdominal pain.   HPI  History reviewed. No pertinent past medical history.  There are no active problems to display for this patient.   History reviewed. No pertinent surgical history.      Home Medications    Prior to Admission medications   Medication Sig Start Date End Date Taking? Authorizing Provider  acetaminophen (TYLENOL) 325 MG tablet Take 2 tablets (650 mg total) by mouth every 6 (six) hours as needed. 03/08/17   Couture, Cortni S, [name]  cyclobenzaprine (FLEXERIL) 10 MG tablet Take 1 tablet (10 mg total) by mouth 3 (three) times daily as needed for muscle spasms. 03/14/17   [name], [name], [name]  diphenhydramine-acetaminophen (TYLENOL PM) 25-500 MG TABS Take 2 tablets by mouth at bedtime as needed (for sleep).    [provider]  famotidine (PEPCID) 20 MG tablet Take 1 tablet (20 mg total) by mouth 2 (two) times daily. 04/02/17   [name], [name]  fluticasone (FLONASE) 50 MCG/ACT nasal spray Place 2 sprays into both nostrils daily. 03/08/17   Couture, Cortni S, [name]  guaiFENesin-dextromethorphan (ROBITUSSIN DM) 100-10 MG/5ML syrup Take 5 mLs by mouth every 4 (four) hours as needed for cough. 05/18/17   [name], [name]  ibuprofen (ADVIL,MOTRIN) 800 MG tablet Take 1 tablet (800 mg total) by mouth every 8 (eight)  hours as needed for moderate pain. 03/14/17   [name], [name], [name]  Lido-Capsaicin-Men-Methyl Sal 0.5-0.035-5-20 % PTCH Apply 1 patch topically daily. 02/09/17   [name], [name], [name]  loratadine (CLARITIN) 10 MG tablet Take 1 tablet (10 mg total) by mouth daily. 03/08/17   Couture, Cortni S, [name]  methocarbamol (ROBAXIN) 500 MG tablet Take 1 tablet (500 mg total) by mouth 2 (two) times daily. 05/11/17   Hedges, Tinnie Gens, [name]  sodium chloride (OCEAN) 0.65 % SOLN nasal spray Place 1 spray into both nostrils as needed for congestion. 05/18/17   [name], [name]  sucralfate (CARAFATE) 1 g tablet Take 1 tablet (1 g total) by mouth 4 (four) times daily -  with meals and at bedtime. 04/02/17   [name], [name]    Family History History reviewed. No pertinent family history.  Social History Social History   Tobacco Use  . Smoking status: Current Every Day Smoker    Packs/day: 1.00    Types: Cigarettes  . Smokeless tobacco: Never Used  Substance Use Topics  . Alcohol use: Yes    Alcohol/week: 3.0 oz    Types: 5 Cans of beer per week    Comment: occ  . Drug use: No     Allergies   Patient has no known allergies.   Review of Systems Review of Systems  Constitutional: Positive for appetite change. Negative for chills, diaphoresis, fatigue and fever.  HENT: Positive for congestion and sore  throat. Negative for ear pain.   Eyes: Negative for visual disturbance.  Respiratory: Positive for cough and chest tightness. Negative for choking, shortness of breath, wheezing and stridor.   Cardiovascular: Negative for chest pain and palpitations.  Gastrointestinal: Negative for abdominal distention, abdominal pain, diarrhea, nausea and vomiting.  Genitourinary: Negative for difficulty urinating, dysuria, flank pain, frequency and hematuria.  Musculoskeletal: Positive for back pain. Negative for arthralgias, gait problem, neck pain and neck stiffness.  Skin: Negative for color  change, pallor and rash.  Neurological: Positive for headaches. Negative for dizziness, seizures, syncope, facial asymmetry, weakness, light-headedness and numbness.     Physical Exam Updated Vital Signs BP (!) 146/100 (BP Location: Right Arm)   Pulse 82   Temp 98.5 F (36.9 C) (Oral)   Resp 18   Ht 5\' 5"  (1.651 m)   Wt 72.1 kg (159 lb)   SpO2 99%   BMI 26.46 kg/m   Physical Exam  Constitutional: He is oriented to person, place, and time. He appears well-developed and well-nourished. No distress.  Afebrile, nontoxic-appearing, sitting acute distress.  HENT:  Head: Normocephalic and atraumatic.  Right Ear: External ear normal.  Left Ear: External ear normal.  Mouth/Throat: Oropharynx is clear and moist. No oropharyngeal exudate.  Normal tympanic membranes bilaterally  Eyes: Conjunctivae and EOM are normal. Right eye exhibits no discharge. Left eye exhibits no discharge.  Neck: Normal range of motion. Neck supple.  Cardiovascular: Normal rate, regular rhythm and normal heart sounds.  No murmur heard. Pulmonary/Chest: Effort normal and breath sounds normal. No stridor. No respiratory distress. He has no wheezes. He has no rales.  Abdominal: He exhibits no distension.  Musculoskeletal: Normal range of motion. He exhibits no edema.  Lymphadenopathy:    He has no cervical adenopathy.  Neurological: He is alert and oriented to person, place, and time. He exhibits normal muscle tone.  Skin: Skin is warm and dry. No rash noted. He is not diaphoretic. No erythema. No pallor.  Psychiatric: He has a normal mood and affect.  Nursing note and vitals reviewed.    ED Treatments / Results  Labs (all labs ordered are listed, but only abnormal results are displayed) Labs Reviewed - No data to display  EKG None  Radiology Dg Chest 2 View  Result Date: 05/18/2017 CLINICAL DATA:  Cough EXAM: CHEST - 2 VIEW COMPARISON:  11/19/2012 FINDINGS: The heart size and mediastinal contours are  within normal limits. Both lungs are clear. The visualized skeletal structures are unremarkable. IMPRESSION: No active cardiopulmonary disease. Electronically Signed   By: Alcide CleverMark  Lukens M.D.   On: 05/18/2017 16:35    Procedures Procedures (including critical care time)  Medications Ordered in ED Medications - No data to display   Initial Impression / Assessment and Plan / ED Course  I have reviewed the triage vital signs and the nursing notes.  Pertinent labs & imaging results that were available during my care of the patient were reviewed by me and considered in my medical decision making (see chart for details).    Patient presents with upper respiratory symptoms for 3 days. Afebrile, nontoxic.  Pt CXR negative for acute infiltrate. Patients symptoms are consistent with URI, likely viral etiology. Discussed that antibiotics are not indicated for viral infections. Pt will be discharged with symptomatic treatment.  Verbalizes understanding and is agreeable with plan. Pt is hemodynamically stable & in NAD prior to dc.  Patient was discharged home with symptomatic relief and close follow-up with PCP.  Discussed  strict return precautions and advised to return to the emergency department if experiencing any new or worsening symptoms. Instructions were understood and patient agreed with discharge plan. Final Clinical Impressions(s) / ED Diagnoses   Final diagnoses:  Viral upper respiratory tract infection    ED Discharge Orders        Ordered    sodium chloride (OCEAN) 0.65 % SOLN nasal spray  As needed     05/18/17 1724    guaiFENesin-dextromethorphan (ROBITUSSIN DM) 100-10 MG/5ML syrup  Every 4 hours PRN     05/18/17 1724       [name], [name] 05/18/17 1754    Nira Conn, MD 05/19/17 1127

## 2017-05-18 NOTE — ED Notes (Signed)
Pt verbalizes understanding of d/c instructions. Pt received prescriptions. Pt ambulatory at d/c with all belongings.  

## 2017-06-16 ENCOUNTER — Emergency Department (HOSPITAL_COMMUNITY)
Admission: EM | Admit: 2017-06-16 | Discharge: 2017-06-16 | Disposition: A | Discharge status: Home / Self Care | Payer: Medicaid Other | Attending: Emergency Medicine | Admitting: Emergency Medicine

## 2017-06-16 ENCOUNTER — Other Ambulatory Visit: Payer: Self-pay

## 2017-06-16 ENCOUNTER — Encounter (HOSPITAL_COMMUNITY): Payer: Self-pay | Admitting: Emergency Medicine

## 2017-06-16 DIAGNOSIS — Z79899 Other long term (current) drug therapy: Secondary | ICD-10-CM | POA: Insufficient documentation

## 2017-06-16 DIAGNOSIS — M545 Low back pain, unspecified: Secondary | ICD-10-CM

## 2017-06-16 DIAGNOSIS — F17210 Nicotine dependence, cigarettes, uncomplicated: Secondary | ICD-10-CM | POA: Insufficient documentation

## 2017-06-16 MED ORDER — IBUPROFEN 800 MG PO TABS: 800.0000 mg | ORAL_TABLET | Freq: Three times a day (TID) | ORAL | 0 refills | Status: DC

## 2017-06-16 MED ORDER — METHOCARBAMOL 500 MG PO TABS: 500.0000 mg | ORAL_TABLET | Freq: Two times a day (BID) | ORAL | 0 refills | Status: DC

## 2017-06-16 NOTE — ED Provider Notes (Signed)
[facility] EMERGENCY DEPARTMENT Provider Note   CSN: [id number] Arrival date & time: 06/16/17  1946     History   Chief Complaint Chief Complaint  Patient presents with  . Back Pain    HPI MASSON NALEPA is a 37 y.o. male.  HPI   37 year old male presents today with complaints of lower back pain.  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  He notes he twisted causing pain to the lower lumbar region.  He denies any radiation of symptoms abdominal pain, loss of distal sensation strength and motor function.  He notes this is similar to previous episodes of back injury.  He notes usually takes ibuprofen which improved his symptoms but did not have his medication with him.  Patient denies any other complaints here today.  History reviewed. No pertinent past medical history.  There are no active problems to display for this patient.   History reviewed. No pertinent surgical history.      Home Medications    Prior to Admission medications   Medication Sig Start Date End Date Taking? Authorizing Provider  acetaminophen (TYLENOL) 325 MG tablet Take 2 tablets (650 mg total) by mouth every 6 (six) hours as needed. 03/08/17   Couture, Cortni S, [name]  cyclobenzaprine (FLEXERIL) 10 MG tablet Take 1 tablet (10 mg total) by mouth 3 (three) times daily as needed for muscle spasms. 03/14/17   [name], [name], [name]  diphenhydramine-acetaminophen (TYLENOL PM) 25-500 MG TABS Take 2 tablets by mouth at bedtime as needed (for sleep).    [provider]  famotidine (PEPCID) 20 MG tablet Take 1 tablet (20 mg total) by mouth 2 (two) times daily. 04/02/17   [name], [name]  fluticasone (FLONASE) 50 MCG/ACT nasal spray Place 2 sprays into both nostrils daily. 03/08/17   Couture, Cortni S, [name]  guaiFENesin-dextromethorphan (ROBITUSSIN DM) 100-10 MG/5ML syrup Take 5 mLs by mouth every 4 (four) hours as needed for cough. 05/18/17   [name], [name]  ibuprofen (ADVIL,MOTRIN) 800 MG tablet Take 1 tablet (800 mg total) by mouth 3 (three) times daily. 06/16/17   Alisen Marsiglia, Tinnie Gens, [name]  Lido-Capsaicin-Men-Methyl Sal 0.5-0.035-5-20 % PTCH Apply 1 patch topically daily. 02/09/17   [name], [name], [name]  loratadine (CLARITIN) 10 MG tablet Take 1 tablet (10 mg total) by mouth daily. 03/08/17   Couture, Cortni S, [name]  methocarbamol (ROBAXIN) 500 MG tablet Take 1 tablet (500 mg total) by mouth 2 (two) times daily. 06/16/17   Gunnard Dorrance, Tinnie Gens, [name]  sodium chloride (OCEAN) 0.65 % SOLN nasal spray Place 1 spray into both nostrils as needed for congestion. 05/18/17   [name], [name]  sucralfate (CARAFATE) 1 g tablet Take 1 tablet (1 g total) by mouth 4 (four) times daily -  with meals and at bedtime. 04/02/17   [name], [name]    Family History No family history on file.  Social History Social History   Tobacco Use  . Smoking status: Current Every Day Smoker    Packs/day: 1.00    Types: Cigarettes  . Smokeless tobacco: Never Used  Substance Use Topics  . Alcohol use: Yes    Alcohol/week: 3.0 oz    Types: 5 Cans of beer per week    Comment: occ  . Drug use: No     Allergies   Patient has no known allergies.   Review of Systems Review of Systems  All other systems reviewed and  are negative.    Physical Exam Updated Vital Signs BP 119/78 (BP Location: Right Arm)   Pulse 88   Temp 98.4 F (36.9 C) (Oral)   Resp 18   SpO2 99%   Physical Exam  Constitutional: He is oriented to person, place, and time. He appears well-developed and well-nourished.  HENT:  Head: Normocephalic and atraumatic.  Eyes: Pupils are equal, round, and reactive to light. Conjunctivae are normal. Right eye exhibits no discharge. Left eye exhibits no discharge. No scleral icterus.  Neck: Normal range of motion. No JVD present. No tracheal deviation present.  Pulmonary/Chest: Effort normal. No stridor.  Musculoskeletal:  No CT or  T-spine tenderness palpation, palpation of the lumbar region diffusely, nonfocal no swelling warmth or redness-bilateral lower extremity sensation strength and motor function intact  Neurological: He is alert and oriented to person, place, and time. Coordination normal.  Psychiatric: He has a normal mood and affect. His behavior is normal. Judgment and thought content normal.  Nursing note and vitals reviewed.    ED Treatments / Results  Labs (all labs ordered are listed, but only abnormal results are displayed) Labs Reviewed - No data to display  EKG None  Radiology No results found.  Procedures Procedures (including critical care time)  Medications Ordered in ED Medications - No data to display   Initial Impression / Assessment and Plan / ED Course  I have reviewed the triage vital signs and the nursing notes.  Pertinent labs & imaging results that were available during my care of the patient were reviewed by me and considered in my medical decision making (see chart for details).     37 year old male presents today with back pain.  This appears to be uncomplicated no red flags.  Discharged with ibuprofen and muscle relaxers and strict return precautions.  He verbalized understanding and agreement to today's plan had no further questions or concerns at time of discharge.  Final Clinical Impressions(s) / ED Diagnoses   Final diagnoses:  Acute bilateral low back pain without sciatica    ED Discharge Orders        Ordered    methocarbamol (ROBAXIN) 500 MG tablet  2 times daily     06/16/17 2126    ibuprofen (ADVIL,MOTRIN) 800 MG tablet  3 times daily     06/16/17 2126       Eyvonne Mechanic, Cordelia Poche 06/16/17 2126    Margarita Grizzle, MD 06/16/17 2219

## 2017-06-16 NOTE — Discharge Instructions (Addendum)
Please read attached information. If you experience any new or worsening signs or symptoms please return to the emergency room for evaluation. Please follow-up with your primary care provider or specialist as discussed. Please use medication prescribed only as directed and discontinue taking if you have any concerning signs or symptoms.   °

## 2017-06-16 NOTE — ED Triage Notes (Signed)
Patient states that he thinks that he moved wrong at work last night after picking up a box of chicken, he states that he works at [employer].

## 2017-06-25 ENCOUNTER — Emergency Department (HOSPITAL_COMMUNITY)
Admission: EM | Admit: 2017-06-25 | Discharge: 2017-06-26 | Disposition: A | Discharge status: Home / Self Care | Payer: Medicaid Other | Attending: Emergency Medicine | Admitting: Emergency Medicine

## 2017-06-25 ENCOUNTER — Encounter (HOSPITAL_COMMUNITY): Payer: Self-pay | Admitting: Emergency Medicine

## 2017-06-25 DIAGNOSIS — M7522 Bicipital tendinitis, left shoulder: Secondary | ICD-10-CM | POA: Insufficient documentation

## 2017-06-25 DIAGNOSIS — M67922 Unspecified disorder of synovium and tendon, left upper arm: Secondary | ICD-10-CM

## 2017-06-25 DIAGNOSIS — Z79899 Other long term (current) drug therapy: Secondary | ICD-10-CM | POA: Insufficient documentation

## 2017-06-25 DIAGNOSIS — F17210 Nicotine dependence, cigarettes, uncomplicated: Secondary | ICD-10-CM | POA: Insufficient documentation

## 2017-06-25 MED ORDER — IBUPROFEN 400 MG PO TABS
400.0000 mg | ORAL_TABLET | Freq: Once | ORAL | Status: AC | PRN
  Administered 2017-06-25: 400 mg via ORAL
  Filled 2017-06-25: qty 1

## 2017-06-25 NOTE — ED Triage Notes (Signed)
Reports pain in left shoulder that started yesterday while working.  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  Took ibuprofen  yesterday at 4am with no relief.

## 2017-06-26 MED ORDER — IBUPROFEN 800 MG PO TABS
800.0000 mg | ORAL_TABLET | Freq: Three times a day (TID) | ORAL | 0 refills | Status: DC
Start: 2017-06-26 — End: 2017-07-13

## 2017-06-26 MED ORDER — METHOCARBAMOL 500 MG PO TABS: 500.0000 mg | ORAL_TABLET | Freq: Two times a day (BID) | ORAL | 0 refills | Status: DC

## 2017-06-26 NOTE — ED Provider Notes (Signed)
[facility] EMERGENCY DEPARTMENT Provider Note   CSN: [id number] Arrival date & time: 06/25/17  2309     History   Chief Complaint Chief Complaint  Patient presents with  . Shoulder Pain    HPI Phillip Mcconnell is a 37 y.o. male who presents to the emergency department with a chief complaint of left shoulder pain.  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 pain is worse with lifting and improved with holding his left shoulders still.  He treated his symptoms at home with 200 mg of ibuprofen, last dose greater than 24 hours ago.  He denies neck pain, back pain, chest pain, dyspnea, left elbow or wrist pain, numbness, weakness, or right upper extremity pain.  No history of left shoulder surgery.  The history is provided by the patient. No language interpreter was used.    History reviewed. No pertinent past medical history.  There are no active problems to display for this patient.   History reviewed. No pertinent surgical history.      Home Medications    Prior to Admission medications   Medication Sig Start Date End Date Taking? Authorizing Provider  acetaminophen (TYLENOL) 325 MG tablet Take 2 tablets (650 mg total) by mouth every 6 (six) hours as needed. 03/08/17   Couture, Cortni S, [name]  cyclobenzaprine (FLEXERIL) 10 MG tablet Take 1 tablet (10 mg total) by mouth 3 (three) times daily as needed for muscle spasms. 03/14/17   [name], [name], [name]  diphenhydramine-acetaminophen (TYLENOL PM) 25-500 MG TABS Take 2 tablets by mouth at bedtime as needed (for sleep).    [provider]  famotidine (PEPCID) 20 MG tablet Take 1 tablet (20 mg total) by mouth 2 (two) times daily. 04/02/17   [name], [name]  fluticasone (FLONASE) 50 MCG/ACT nasal spray Place 2 sprays into both nostrils daily. 03/08/17   Couture, Cortni S,  [name]  guaiFENesin-dextromethorphan (ROBITUSSIN DM) 100-10 MG/5ML syrup Take 5 mLs by mouth every 4 (four) hours as needed for cough. 05/18/17   [name], [name]  ibuprofen (ADVIL,MOTRIN) 800 MG tablet Take 1 tablet (800 mg total) by mouth 3 (three) times daily. 06/26/17   McDonald, Mia A, [name]  Lido-Capsaicin-Men-Methyl Sal 0.5-0.035-5-20 % PTCH Apply 1 patch topically daily. 02/09/17   [name], [name], [name]  loratadine (CLARITIN) 10 MG tablet Take 1 tablet (10 mg total) by mouth daily. 03/08/17   Couture, Cortni S, [name]  methocarbamol (ROBAXIN) 500 MG tablet Take 1 tablet (500 mg total) by mouth 2 (two) times daily. 06/26/17   McDonald, Mia A, [name]  sodium chloride (OCEAN) 0.65 % SOLN nasal spray Place 1 spray into both nostrils as needed for congestion. 05/18/17   [name], [name]  sucralfate (CARAFATE) 1 g tablet Take 1 tablet (1 g total) by mouth 4 (four) times daily -  with meals and at bedtime. 04/02/17   [name], [name]    Family History No family history on file.  Social History Social History   Tobacco Use  . Smoking status: Current Every Day Smoker    Packs/day: 1.00    Types: Cigarettes  . Smokeless tobacco: Never Used  Substance Use Topics  . Alcohol use: Yes    Alcohol/week: 3.0 oz    Types: 5 Cans of beer per week    Comment: occ  .  Drug use: No     Allergies   Patient has no known allergies.   Review of Systems Review of Systems  Constitutional: Negative for activity change.  Respiratory: Negative for shortness of breath.   Cardiovascular: Negative for chest pain.  Gastrointestinal: Negative for abdominal pain.  Musculoskeletal: Positive for arthralgias and myalgias. Negative for back pain.  Skin: Negative for rash.  Neurological: Negative for weakness and numbness.     Physical Exam Updated Vital Signs BP (!) 137/92 (BP Location: Right Arm)   Pulse 68   Temp 98.9 F (37.2 C) (Oral)   Resp 17   Ht  (1.651 m)   Wt 71.2 kg (157  lb)   SpO2 100%   BMI 26.13 kg/m   Physical Exam  Constitutional: He appears well-developed.  HENT:  Head: Normocephalic.  Eyes: Conjunctivae are normal.  Neck: Neck supple.  Cardiovascular: Normal rate, regular rhythm, normal heart sounds and intact distal pulses. Exam reveals no gallop and no friction rub.  No murmur heard. Pulmonary/Chest: Effort normal and breath sounds normal. No stridor. No respiratory distress. He has no wheezes. He has no rales. He exhibits no tenderness.  Abdominal: Soft. He exhibits no distension.  Musculoskeletal: He exhibits tenderness. He exhibits no edema or deformity.  Tender to palpation over the proximal insertion point of the left biceps brachii.  The acromion on and coracoid process are nontender.  Radial pulses 2+ and symmetric.  Sensation is intact and equal throughout the bilateral upper extremities.  Full active and passive range of motion of the bilateral wrist and elbows.  Full passive range of motion of the left shoulder.  Active range of motion is intact, but he has increased pain with ABduction.  Negative Apley scratch test.  Negative empty can test.  No tenderness to the spinous processes or bilateral paraspinal muscles of the cervical, thoracic, or lumbar spine.  Neurological: He is alert.  Skin: Skin is warm and dry.  Psychiatric: His behavior is normal.  Nursing note and vitals reviewed.    ED Treatments / Results  Labs (all labs ordered are listed, but only abnormal results are displayed) Labs Reviewed - No data to display  EKG None  Radiology No results found.  Procedures Procedures (including critical care time)  Medications Ordered in ED Medications  ibuprofen (ADVIL,MOTRIN) tablet 400 mg (400 mg Oral Given 06/25/17 2340)     Initial Impression / Assessment and Plan / ED Course  I have reviewed the triage vital signs and the nursing notes.  Pertinent labs & imaging results that were available during my care of the  patient were reviewed by me and considered in my medical decision making (see chart for details).     37 year old male who has been evaluated 11 times in the emergency department over the last 6 months.  He was last seen and evaluated on May 16 for bilateral low back pain.  His low back exam was unremarkable today.  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  On his exam, he has no bony tenderness.  X-rays not indicated at this time.  He has tenderness over the proximal attachment of the biceps brachii on the left.  Negative Apley scratch test and empty can test.  Doubt fracture, shoulder dislocation, rotator cuff tear.  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  Will discharge the patient home with rice therapy and supportive treatment.  Strict return precautions given.  The patient is hemodynamically stable and  in no acute distress.  He is safe for discharge home at this time.  Final Clinical Impressions(s) / ED Diagnoses   Final diagnoses:  Tendinopathy of left biceps tendon    ED Discharge Orders        Ordered    ibuprofen (ADVIL,MOTRIN) 800 MG tablet  3 times daily     06/26/17 1051    methocarbamol (ROBAXIN) 500 MG tablet  2 times daily     06/26/17 1051       McDonald, Mia A, [name] 06/26/17 1102    Cardama, Amadeo Garnet, MD 06/26/17 1649

## 2017-06-26 NOTE — ED Notes (Signed)
Called x 3 NO answer 

## 2017-06-26 NOTE — Discharge Instructions (Signed)
Thank you for allowing me to provide your care today in the emergency department.  Your symptoms are consistent with biceps tendinopathy.  To treat your symptoms, take 1 tablet of ibuprofen with food every 8 hours.  Scheduling this medication every 8 hours for the first few days can with better control of inflammation and pain.  Take 1 tablet of Robaxin every 12 hours as needed for muscle pain or spasms.  Apply ice for 15 to 20 minutes up to 3-4 times a day.  This will help with pain and inflammation.  Start to gently stretch the muscles of your left shoulder as your pain allows.  I have provided a referral to sports medicine if your symptoms do not start to improve within the next week.  Return to the emergency department if you develop new or worsening symptoms including a new significant injury, numbness, weakness in the left upper extremity, chest pain, shortness of breath, or other new concerning symptoms.

## 2017-07-13 ENCOUNTER — Encounter (HOSPITAL_COMMUNITY): Payer: Self-pay | Admitting: Emergency Medicine

## 2017-07-13 ENCOUNTER — Emergency Department (HOSPITAL_COMMUNITY)
Admission: EM | Admit: 2017-07-13 | Discharge: 2017-07-13 | Disposition: A | Discharge status: Home / Self Care | Payer: Medicaid Other | Attending: Emergency Medicine | Admitting: Emergency Medicine

## 2017-07-13 DIAGNOSIS — Y990 Civilian activity done for income or pay: Secondary | ICD-10-CM | POA: Insufficient documentation

## 2017-07-13 DIAGNOSIS — S46912A Strain of unspecified muscle, fascia and tendon at shoulder and upper arm level, left arm, initial encounter: Secondary | ICD-10-CM | POA: Insufficient documentation

## 2017-07-13 DIAGNOSIS — X500XXA Overexertion from strenuous movement or load, initial encounter: Secondary | ICD-10-CM | POA: Insufficient documentation

## 2017-07-13 DIAGNOSIS — Y929 Unspecified place or not applicable: Secondary | ICD-10-CM | POA: Insufficient documentation

## 2017-07-13 DIAGNOSIS — F17210 Nicotine dependence, cigarettes, uncomplicated: Secondary | ICD-10-CM | POA: Insufficient documentation

## 2017-07-13 DIAGNOSIS — Y939 Activity, unspecified: Secondary | ICD-10-CM | POA: Insufficient documentation

## 2017-07-13 DIAGNOSIS — Z79899 Other long term (current) drug therapy: Secondary | ICD-10-CM | POA: Insufficient documentation

## 2017-07-13 MED ORDER — METHOCARBAMOL 500 MG PO TABS: 500.0000 mg | ORAL_TABLET | Freq: Two times a day (BID) | ORAL | 0 refills | Status: DC

## 2017-07-13 MED ORDER — IBUPROFEN 400 MG PO TABS: 400.0000 mg | ORAL_TABLET | Freq: Four times a day (QID) | ORAL | 0 refills | Status: DC | PRN

## 2017-07-13 NOTE — ED Provider Notes (Signed)
[name] EMERGENCY DEPARTMENT Provider Note   CSN: [id number] Arrival date & time: 07/13/17  1117     History   Chief Complaint Chief Complaint  Patient presents with  . Shoulder Pain    HPI Phillip Mcconnell is a 37 y.o. male.  HPI   Patient Is a 37 year old male with no significant past medical history presents emergency department today complaining of left shoulder pain that has been ongoing for the last several days.  States the pain is located to his anterior shoulder.  It is 5/10 at rest and 9/10 when he moves or tries to lift something.  Notes that he does a lot of heavy lifting at work and his pain seems to be exacerbated after his shifts.  Denies any chest pain or shortness of breath.  No headaches or fevers.  No abdominal pain nausea vomiting diarrhea or other symptoms.  No numbness or weakness to his arms or legs.  He has been taking 800 mg of ibuprofen at home which he states improved his symptoms however he ran out of ibuprofen and has not bought any more.  States that Flexeril has also helped him in the past.    On review of prior records it appears that patient has had similar pain and was seen in the emergency department for similar symptoms on 06/25/2017. At that time he was suspected to have biceps brachii tendinopathy.  He was discharged with ibuprofen and muscle relaxers.  History reviewed. No pertinent past medical history.  There are no active problems to display for this patient.   History reviewed. No pertinent surgical history.      Home Medications    Prior to Admission medications   Medication Sig Start Date End Date Taking? Authorizing Provider  acetaminophen (TYLENOL) 325 MG tablet Take 2 tablets (650 mg total) by mouth every 6 (six) hours as needed. 03/08/17   [name], [name]  cyclobenzaprine (FLEXERIL) 10 MG tablet Take 1 tablet (10 mg total) by mouth 3 (three) times daily as needed for muscle spasms. 03/14/17    [name], [name], [name]  diphenhydramine-acetaminophen (TYLENOL PM) 25-500 MG TABS Take 2 tablets by mouth at bedtime as needed (for sleep).    [provider]  famotidine (PEPCID) 20 MG tablet Take 1 tablet (20 mg total) by mouth 2 (two) times daily. 04/02/17   [name], [name]  fluticasone (FLONASE) 50 MCG/ACT nasal spray Place 2 sprays into both nostrils daily. 03/08/17   [name], [name]  guaiFENesin-dextromethorphan (ROBITUSSIN DM) 100-10 MG/5ML syrup Take 5 mLs by mouth every 4 (four) hours as needed for cough. 05/18/17   [name], [name]  ibuprofen (ADVIL,MOTRIN) 400 MG tablet Take 1 tablet (400 mg total) by mouth every 6 (six) hours as needed. 07/13/17   [name], [name]  Lido-Capsaicin-Men-Methyl Sal 0.5-0.035-5-20 % PTCH Apply 1 patch topically daily. 02/09/17   [name], [name], [name]  loratadine (CLARITIN) 10 MG tablet Take 1 tablet (10 mg total) by mouth daily. 03/08/17   Cailan Antonucci S, [name]  methocarbamol (ROBAXIN) 500 MG tablet Take 1 tablet (500 mg total) by mouth 2 (two) times daily. 07/13/17   [name], [name]  sodium chloride (OCEAN) 0.65 % SOLN nasal spray Place 1 spray into both nostrils as needed for congestion. 05/18/17   [name], [name]  sucralfate (CARAFATE) 1 g tablet Take 1 tablet (1 g total) by mouth 4 (four) times daily -  with meals  and at bedtime. 04/02/17   [name], [name], [name]    Family History No family history on file.  Social History Social History   Tobacco Use  . Smoking status: Current Every Day Smoker    Packs/day: 1.00    Types: Cigarettes  . Smokeless tobacco: Never Used  Substance Use Topics  . Alcohol use: Yes    Alcohol/week: 3.0 oz    Types: 5 Cans of beer per week    Comment: occ  . Drug use: No     Allergies   Patient has no known allergies.   Review of Systems Review of Systems  Constitutional: Negative for fever.  Respiratory: Negative for shortness of breath.   Cardiovascular:  Negative for chest pain.  Gastrointestinal: Negative for abdominal pain and diarrhea.  Musculoskeletal: Negative for back pain.       Shoulder pain  Neurological: Negative for dizziness, weakness, numbness and headaches.     Physical Exam Updated Vital Signs BP (!) 151/103 (BP Location: Right Arm)   Pulse 71   Temp 98.2 F (36.8 C) (Oral)   Resp 16   SpO2 100%   Physical Exam  Constitutional: He is oriented to person, place, and time. He appears well-developed and well-nourished. No distress.  Eyes: Conjunctivae are normal.  Cardiovascular: Normal rate, regular rhythm and normal heart sounds.  Pulmonary/Chest: Effort normal. No stridor. He has wheezes.  Abdominal: Soft. Bowel sounds are normal. There is no tenderness.  Musculoskeletal:  No midline TTP. No TTP to paracspinous muscles. TTP to anterior left shoulder. Positive cross over test. Negative empty can. FROM and strength intact. Sensation intact and symmetric. Distal pulses strong and symmetric. No erythema, warmth, or swelling to joint.   Neurological: He is alert and oriented to person, place, and time.  Skin: Skin is warm and dry. Capillary refill takes less than 2 seconds.  Psychiatric: He has a normal mood and affect.   ED Treatments / Results  Labs (all labs ordered are listed, but only abnormal results are displayed) Labs Reviewed - No data to display  EKG None  Radiology No results found.  Procedures Procedures (including critical care time)  Medications Ordered in ED Medications - No data to display   Initial Impression / Assessment and Plan / ED Course  I have reviewed the triage vital signs and the nursing notes.  Pertinent labs & imaging results that were available during my care of the patient were reviewed by me and considered in my medical decision making (see chart for details).   pt requesting a work note   Final Clinical Impressions(s) / ED Diagnoses   Final diagnoses:  Strain of left  shoulder, initial encounter   37 year old male presenting to the ED today complaining of left shoulder pain.   he was evaluated for a similar complaint on 06/25/2017 and was treated with Robaxin and ibuprofen.  At that time was thought to have biceps brachii tendinopathy.  Today he is presenting with the same symptoms.  States he ran out of his ibuprofen and no longer has medications to treat his symptoms.  Denies any falls or recent trauma.  Denies any chest pain or shortness of breath.  He is hypertensive and discussed this with him about the need for follow-up and potential need to start medication.  No evidence of hypertensive emergency at this time given that he is asymptomatic.  Will give muscle relaxers and ibuprofen for home.  Advised him to follow-up.  Gave Ortho referral if  symptoms persist.  All questions answered and patient understands plan and reasons to return immediately to the ED.  ED Discharge Orders        Ordered    ibuprofen (ADVIL,MOTRIN) 400 MG tablet  Every 6 hours PRN     07/13/17 1346    methocarbamol (ROBAXIN) 500 MG tablet  2 times daily     07/13/17 1346       Journi Moffa S, [name] 07/13/17 1347    Rolland Porter, MD 07/14/17 [phone]

## 2017-07-13 NOTE — ED Notes (Signed)
Cortni PA at bedside  

## 2017-07-13 NOTE — Discharge Instructions (Signed)

## 2017-07-13 NOTE — ED Triage Notes (Signed)
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 Denies other symptoms. Alert, oriented, and in no apparent distress at this time.

## 2017-08-18 ENCOUNTER — Other Ambulatory Visit: Payer: Self-pay

## 2017-08-18 ENCOUNTER — Encounter (HOSPITAL_COMMUNITY): Payer: Self-pay | Admitting: *Deleted

## 2017-08-18 ENCOUNTER — Emergency Department (HOSPITAL_COMMUNITY)
Admission: EM | Admit: 2017-08-18 | Discharge: 2017-08-18 | Disposition: A | Discharge status: Home / Self Care | Payer: Medicaid Other | Attending: Emergency Medicine | Admitting: Emergency Medicine

## 2017-08-18 DIAGNOSIS — M545 Low back pain, unspecified: Secondary | ICD-10-CM

## 2017-08-18 DIAGNOSIS — F17210 Nicotine dependence, cigarettes, uncomplicated: Secondary | ICD-10-CM | POA: Insufficient documentation

## 2017-08-18 DIAGNOSIS — Z79899 Other long term (current) drug therapy: Secondary | ICD-10-CM | POA: Insufficient documentation

## 2017-08-18 DIAGNOSIS — G8929 Other chronic pain: Secondary | ICD-10-CM

## 2017-08-18 MED ORDER — CYCLOBENZAPRINE HCL 10 MG PO TABS: 10.0000 mg | ORAL_TABLET | Freq: Three times a day (TID) | ORAL | 0 refills | Status: DC | PRN

## 2017-08-18 NOTE — Discharge Instructions (Addendum)
Continue with Motrin as needed as directed limit use to avoid kidney problems. Return to the ER for emergent problems otherwise follow-up with urgent care or primary care, referral given, for further medication management. Gentle stretching, warm compresses or icy hot to low back as needed for pain.

## 2017-08-18 NOTE — ED Triage Notes (Signed)
Pt reports he ran out of his flexeril and needs a refill . Pt reports Back pain has not changed.

## 2017-08-18 NOTE — ED Provider Notes (Signed)
[facility] EMERGENCY [facility]PARTMENT Provider Note   CSN: [id number] Arrival date & time: 08/18/17  1031     History   Chief Complaint Chief Complaint  Patient presents with  . Back Pain    HPI Phillip Mcconnell is a 37 y.o. male.  37 year old male presents with request for refill of his Flexeril which he takes for chronic low back pain.  Patient denies falls or injuries resulting in his low back pain, denies abdominal pain, loss of bowel or bladder control, groin numbness.  Patient states he last took a Flexeril 1 week ago, takes this as well as ibuprofen for aching pain across his lower back which he relates to his job where he lifts frequently.  Patient denies any other medical problems, does not take any other medications.  No other complaints or concerns.     History reviewed. No pertinent past medical history.  There are no active problems to display for this patient.   History reviewed. No pertinent surgical history.      Home Medications    Prior to Admission medications   Medication Sig Start Date End Date Taking? Authorizing Provider  acetaminophen (TYLENOL) 325 MG tablet Take 2 tablets (650 mg total) by mouth every 6 (six) hours as needed. 03/08/17   Couture, Cortni S, [name]  cyclobenzaprine (FLEXERIL) 10 MG tablet Take 1 tablet (10 mg total) by mouth 3 (three) times daily as needed for muscle spasms. 08/18/17   Jeannie FendMurphy, Lizanne Erker A, [name]  diphenhydramine-acetaminophen (TYLENOL PM) 25-500 MG TABS Take 2 tablets by mouth at bedtime as needed (for sleep).    [provider]  famotidine (PEPCID) 20 MG tablet Take 1 tablet (20 mg total) by mouth 2 (two) times daily. 04/02/17   [name], [name], [name]  fluticasone (FLONASE) 50 MCG/ACT nasal spray Place 2 sprays into both nostrils daily. 03/08/17   Couture, Cortni S, [name]  guaiFENesin-dextromethorphan (ROBITUSSIN DM) 100-10 MG/5ML syrup Take 5 mLs by mouth every 4 (four) hours as needed for cough. 05/18/17    Georgiana ShoreMitchell, Jessica B, [name]  ibuprofen (ADVIL,MOTRIN) 400 MG tablet Take 1 tablet (400 mg total) by mouth every 6 (six) hours as needed. 07/13/17   Couture, Cortni S, [name]  Lido-Capsaicin-Men-Methyl Sal 0.5-0.035-5-20 % PTCH Apply 1 patch topically daily. 02/09/17   [name], Elmer SowMichael M, [name]  loratadine (CLARITIN) 10 MG tablet Take 1 tablet (10 mg total) by mouth daily. 03/08/17   Couture, Cortni S, [name]  methocarbamol (ROBAXIN) 500 MG tablet Take 1 tablet (500 mg total) by mouth 2 (two) times daily. 07/13/17   Couture, Cortni S, [name]  sodium chloride (OCEAN) 0.65 % SOLN nasal spray Place 1 spray into both nostrils as needed for congestion. 05/18/17   Mathews RobinsonsMitchell, Jessica B, [name]  sucralfate (CARAFATE) 1 g tablet Take 1 tablet (1 g total) by mouth 4 (four) times daily -  with meals and at bedtime. 04/02/17   [name], [name], [name]    Family History History reviewed. No pertinent family history.  Social History Social History   Tobacco Use  . Smoking status: Current Every Day Smoker    Packs/day: 1.00    Types: Cigarettes  . Smokeless tobacco: Never Used  Substance Use Topics  . Alcohol use: Yes    Alcohol/week: 3.0 oz    Types: 5 Cans of beer per week    Comment: occ  . Drug use: No     Allergies   Patient has no known allergies.   Review of  Systems Review of Systems  Constitutional: Negative for fever.  Gastrointestinal: Negative for abdominal pain.  Genitourinary: Negative for difficulty urinating.  Musculoskeletal: Positive for back pain. Negative for gait problem and joint swelling.  Skin: Negative for wound.  Allergic/Immunologic: Negative for immunocompromised state.  Neurological: Negative for weakness and numbness.  All other systems reviewed and are negative.    Physical Exam Updated Vital Signs BP (!) 143/95 (BP Location: Right Arm)   Pulse 80   Temp 99.1 F (37.3 C) (Oral)   Resp 16   Ht 5\' 5"  (1.651 m)   Wt 76.7 kg (169 lb)   SpO2 98%   BMI 28.12 kg/m   Physical  Exam  Constitutional: He is oriented to person, place, and time. He appears well-developed and well-nourished. No distress.  HENT:  Head: Normocephalic and atraumatic.  Cardiovascular: Intact distal pulses.  Pulmonary/Chest: Effort normal.  Abdominal: Soft. There is no tenderness.  Musculoskeletal: Normal range of motion. He exhibits tenderness. He exhibits no deformity.       Lumbar back: He exhibits tenderness. He exhibits normal range of motion, no bony tenderness, no deformity and no laceration.       Back:  Neurological: He is alert and oriented to person, place, and time.  Skin: Skin is warm and dry. No rash noted. He is not diaphoretic.  Psychiatric: He has a normal mood and affect. His behavior is normal.  Nursing note and vitals reviewed.    ED Treatments / Results  Labs (all labs ordered are listed, but only abnormal results are displayed) Labs Reviewed - No data to display  EKG None  Radiology No results found.  Procedures Procedures (including critical care time)  Medications Ordered in ED Medications - No data to display   Initial Impression / Assessment and Plan / ED Course  I have reviewed the triage vital signs and the nursing notes.  Pertinent labs & imaging results that were available during my care of the patient were reviewed by me and considered in my medical decision making (see chart for details).  Clinical Course as of Aug 19 1142  Thu Aug 18, 2017  [id number] 37 year old male presents with left and right lower back pain, chronic in nature, no acute injuries, no changes in his chronic pain pattern.  She states that he takes Flexeril for this pain and has run out.  Last took Flexeril 1 week ago.  Takes Motrin on occasion.  On exam has left and right lower back pain without midline or bony tenderness, no red flag symptoms.  Advised patient to follow-up with urgent care or PCP for further med management, given prescription for 14 Flexeril today with referral  for PCP.   [LM]    Clinical Course User Index [LM] Jeannie Fend, [name]    Final Clinical Impressions(s) / ED Diagnoses   Final diagnoses:  Chronic bilateral low back pain without sciatica    ED Discharge Orders        Ordered    cyclobenzaprine (FLEXERIL) 10 MG tablet  3 times daily PRN     08/18/17 1140       Alden Hipp 08/18/17 1145    Loren Racer, MD 08/18/17 2136

## 2017-09-06 ENCOUNTER — Other Ambulatory Visit: Payer: Self-pay

## 2017-09-06 ENCOUNTER — Emergency Department (HOSPITAL_COMMUNITY)
Admission: EM | Admit: 2017-09-06 | Discharge: 2017-09-06 | Disposition: A | Discharge status: Home / Self Care | Payer: Medicaid Other | Attending: Emergency Medicine | Admitting: Emergency Medicine

## 2017-09-06 DIAGNOSIS — F17210 Nicotine dependence, cigarettes, uncomplicated: Secondary | ICD-10-CM | POA: Insufficient documentation

## 2017-09-06 DIAGNOSIS — Y929 Unspecified place or not applicable: Secondary | ICD-10-CM | POA: Insufficient documentation

## 2017-09-06 DIAGNOSIS — Y9389 Activity, other specified: Secondary | ICD-10-CM | POA: Insufficient documentation

## 2017-09-06 DIAGNOSIS — Y999 Unspecified external cause status: Secondary | ICD-10-CM | POA: Insufficient documentation

## 2017-09-06 DIAGNOSIS — Z79899 Other long term (current) drug therapy: Secondary | ICD-10-CM | POA: Insufficient documentation

## 2017-09-06 DIAGNOSIS — X500XXA Overexertion from strenuous movement or load, initial encounter: Secondary | ICD-10-CM | POA: Insufficient documentation

## 2017-09-06 DIAGNOSIS — S46912A Strain of unspecified muscle, fascia and tendon at shoulder and upper arm level, left arm, initial encounter: Secondary | ICD-10-CM

## 2017-09-06 NOTE — ED Triage Notes (Signed)
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

## 2017-09-06 NOTE — ED Notes (Signed)
ED Provider at bedside. 

## 2017-09-06 NOTE — Discharge Instructions (Signed)
Please read attached information. If you experience any new or worsening signs or symptoms please return to the emergency room for evaluation. Please follow-up with your primary care provider or specialist as discussed.  °

## 2017-09-06 NOTE — ED Provider Notes (Signed)
[facility] EMERGENCY DEPARTMENT Provider Note   CSN: [id number] Arrival date & time: 09/06/17  1023     History   Chief Complaint Chief Complaint  Patient presents with  . Shoulder Pain    HPI Phillip Mcconnell is a 37 y.o. male.  HPI  37 year old male presents to with left shoulder pain. 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 He denies any decreased range of motion swelling or edema, denies any distal neurological deficits. Patient is taking ibuprofen at home. No other complaints here today.  No past medical history on file.  There are no active problems to display for this patient.   No past surgical history on file.      Home Medications    Prior to Admission medications   Medication Sig Start Date End Date Taking? Authorizing Provider  acetaminophen (TYLENOL) 325 MG tablet Take 2 tablets (650 mg total) by mouth every 6 (six) hours as needed. 03/08/17   Couture, Cortni S, [name]  cyclobenzaprine (FLEXERIL) 10 MG tablet Take 1 tablet (10 mg total) by mouth 3 (three) times daily as needed for muscle spasms. 08/18/17   Jeannie Fend, [name]  diphenhydramine-acetaminophen (TYLENOL PM) 25-500 MG TABS Take 2 tablets by mouth at bedtime as needed (for sleep).    [provider]  famotidine (PEPCID) 20 MG tablet Take 1 tablet (20 mg total) by mouth 2 (two) times daily. 04/02/17   [name], [name]  fluticasone (FLONASE) 50 MCG/ACT nasal spray Place 2 sprays into both nostrils daily. 03/08/17   Couture, Cortni S, [name]  guaiFENesin-dextromethorphan (ROBITUSSIN DM) 100-10 MG/5ML syrup Take 5 mLs by mouth every 4 (four) hours as needed for cough. 05/18/17   [name], [name]  ibuprofen (ADVIL,MOTRIN) 400 MG tablet Take 1 tablet (400 mg total) by mouth every 6 (six) hours as needed. 07/13/17   Couture, Cortni S, [name]  Lido-Capsaicin-Men-Methyl Sal 0.5-0.035-5-20 % PTCH Apply 1 patch topically daily.  02/09/17   [name], [name], [name]  loratadine (CLARITIN) 10 MG tablet Take 1 tablet (10 mg total) by mouth daily. 03/08/17   Couture, Cortni S, [name]  methocarbamol (ROBAXIN) 500 MG tablet Take 1 tablet (500 mg total) by mouth 2 (two) times daily. 07/13/17   Couture, Cortni S, [name]  sodium chloride (OCEAN) 0.65 % SOLN nasal spray Place 1 spray into both nostrils as needed for congestion. 05/18/17   [name], [name]  sucralfate (CARAFATE) 1 g tablet Take 1 tablet (1 g total) by mouth 4 (four) times daily -  with meals and at bedtime. 04/02/17   [name], [name]    Family History No family history on file.  Social History Social History   Tobacco Use  . Smoking status: Current Every Day Smoker    Packs/day: 1.00    Types: Cigarettes  . Smokeless tobacco: Never Used  Substance Use Topics  . Alcohol use: Yes    Alcohol/week: 3.0 oz    Types: 5 Cans of beer per week    Comment: occ  . Drug use: No     Allergies   Patient has no known allergies.   Review of Systems Review of Systems  All other systems reviewed and are negative.    Physical Exam Updated Vital Signs BP (!) 149/94 (BP Location: Right Arm)   Pulse 77   Temp 98.9 F (37.2 C) (Oral)   Resp 18   Ht 5'  5" (1.651 m)   Wt 76.7 kg (169 lb)   SpO2 100%   BMI 28.12 kg/m   Physical Exam  Constitutional: He is oriented to person, place, and time. He appears well-developed and well-nourished.  HENT:  Head: Normocephalic and atraumatic.  Eyes: Pupils are equal, round, and reactive to light. Conjunctivae are normal. Right eye exhibits no discharge. Left eye exhibits no discharge. No scleral icterus.  Neck: Normal range of motion. No JVD present. No tracheal deviation present.  Pulmonary/Chest: Effort normal. No stridor.  Musculoskeletal:  Left shoulder atraumatic minimal tenderness palpation of the left posterior aspect, full active range of motion no swelling or edema no warmth to touch, no localized pain  with internal/external rotation, flexion or extension. Grip strength 5 out of 5 radial pulse 2+  Neurological: He is alert and oriented to person, place, and time. Coordination normal.  Psychiatric: He has a normal mood and affect. His behavior is normal. Judgment and thought content normal.  Nursing note and vitals reviewed.    ED Treatments / Results  Labs (all labs ordered are listed, but only abnormal results are displayed) Labs Reviewed - No data to display  EKG None  Radiology No results found.  Procedures Procedures (including critical care time)  Medications Ordered in ED Medications - No data to display   Initial Impression / Assessment and Plan / ED Course  I have reviewed the triage vital signs and the nursing notes.  Pertinent labs & imaging results that were available during my care of the patient were reviewed by me and considered in my medical decision making (see chart for details).     Labs:   Imaging:  Consults:  Therapeutics:  Discharge Meds:   Assessment/Plan: 37 year old male presents today with complaints left shoulder pain. Patient has no skin history of musculoskeletal injuries. He is well-appearing in no acute distress, he has no signs of significant trauma, discharged with symptomatic care and outpatient follow-up. He verbalized understanding and agreement to this plan and had no further questions or concerns.    Final Clinical Impressions(s) / ED Diagnoses   Final diagnoses:  Strain of left shoulder, initial encounter    ED Discharge Orders    None       Rosalio LoudHedges, Margretta Zamorano, [name] 09/06/17 1246    Sabas SousBero, Michael M, MD 09/06/17 2146

## 2017-09-13 ENCOUNTER — Emergency Department (HOSPITAL_COMMUNITY)
Admission: EM | Admit: 2017-09-13 | Discharge: 2017-09-13 | Disposition: A | Discharge status: Home / Self Care | Payer: Medicaid Other | Attending: Emergency Medicine | Admitting: Emergency Medicine

## 2017-09-13 ENCOUNTER — Encounter (HOSPITAL_COMMUNITY): Payer: Self-pay | Admitting: *Deleted

## 2017-09-13 ENCOUNTER — Other Ambulatory Visit: Payer: Self-pay

## 2017-09-13 DIAGNOSIS — Z79899 Other long term (current) drug therapy: Secondary | ICD-10-CM | POA: Insufficient documentation

## 2017-09-13 DIAGNOSIS — M545 Low back pain, unspecified: Secondary | ICD-10-CM

## 2017-09-13 DIAGNOSIS — F17210 Nicotine dependence, cigarettes, uncomplicated: Secondary | ICD-10-CM | POA: Insufficient documentation

## 2017-09-13 MED ORDER — METHOCARBAMOL 750 MG PO TABS: 750.0000 mg | ORAL_TABLET | Freq: Three times a day (TID) | ORAL | 0 refills | Status: DC | PRN

## 2017-09-13 MED ORDER — IBUPROFEN 200 MG PO TABS
600.0000 mg | ORAL_TABLET | Freq: Once | ORAL | Status: AC
  Administered 2017-09-13: 10:00:00 600 mg via ORAL
  Filled 2017-09-13: qty 1

## 2017-09-13 NOTE — ED Provider Notes (Signed)
[facility] EMERGENCY [facility]PARTMENT Provider Note   CSN: [id number] Arrival date & time: 09/13/17  0759     History   Chief Complaint Chief Complaint  Patient presents with  . Back Pain    HPI Ronalee Beltsheodore D Hollander is a 37 y.o. male who presents today for evaluation of bilateral lower back pain.  He reports that he was stretching and reaching over to touch his toes when he felt a muscle in his back get tight.  He reports that it got worse overnight after he slept.  Of note patient has multiple emergency room visits for bilateral lower back pain in the past 6 months, he was most recently seen 1 week ago for left shoulder pain.    He denies any changes to bowel or bladder function, no numbness or tingling across upper inner thighs or genitals.  He is able to walk however says that it is painful.  He has had prescriptions for multiple muscle relaxers in the past, however says that he did not try any interventions prior to arrival.  He denies personal history of cancer or IV drug abuse. HPI  History reviewed. No pertinent past medical history.  There are no active problems to display for this patient.   History reviewed. No pertinent surgical history.      Home Medications    Prior to Admission medications   Medication Sig Start Date End Date Taking? Authorizing Provider  acetaminophen (TYLENOL) 325 MG tablet Take 2 tablets (650 mg total) by mouth every 6 (six) hours as needed. 03/08/17   Couture, Cortni S, [name]  diphenhydramine-acetaminophen (TYLENOL PM) 25-500 MG TABS Take 2 tablets by mouth at bedtime as needed (for sleep).    [provider]  famotidine (PEPCID) 20 MG tablet Take 1 tablet (20 mg total) by mouth 2 (two) times daily. 04/02/17   [name], [name], [name]  fluticasone (FLONASE) 50 MCG/ACT nasal spray Place 2 sprays into both nostrils daily. 03/08/17   Couture, Cortni S, [name]  guaiFENesin-dextromethorphan (ROBITUSSIN DM) 100-10 MG/5ML syrup Take 5 mLs  by mouth every 4 (four) hours as needed for cough. 05/18/17   Georgiana ShoreMitchell, Jessica B, [name]  ibuprofen (ADVIL,MOTRIN) 400 MG tablet Take 1 tablet (400 mg total) by mouth every 6 (six) hours as needed. 07/13/17   Couture, Cortni S, [name]  Lido-Capsaicin-Men-Methyl Sal 0.5-0.035-5-20 % PTCH Apply 1 patch topically daily. 02/09/17   [name], Elmer SowMichael M, [name]  loratadine (CLARITIN) 10 MG tablet Take 1 tablet (10 mg total) by mouth daily. 03/08/17   Couture, Cortni S, [name]  methocarbamol (ROBAXIN) 750 MG tablet Take 1-2 tablets (750-1,500 mg total) by mouth 3 (three) times daily as needed for muscle spasms. 09/13/17   Cristina GongHammond, Briley Bumgarner W, [name]  sodium chloride (OCEAN) 0.65 % SOLN nasal spray Place 1 spray into both nostrils as needed for congestion. 05/18/17   Mathews RobinsonsMitchell, Jessica B, [name]  sucralfate (CARAFATE) 1 g tablet Take 1 tablet (1 g total) by mouth 4 (four) times daily -  with meals and at bedtime. 04/02/17   [name], [name], [name]    Family History No family history on file.  Social History Social History   Tobacco Use  . Smoking status: Current Every Day Smoker    Packs/day: 1.00    Types: Cigarettes  . Smokeless tobacco: Never Used  Substance Use Topics  . Alcohol use: Yes    Alcohol/week: 5.0 standard drinks    Types: 5 Cans of beer per week  Comment: occ  . Drug use: No     Allergies   Patient has no known allergies.   Review of Systems Review of Systems  Constitutional: Negative for chills and fever.  Respiratory: Negative for shortness of breath.   Genitourinary: Negative for difficulty urinating and dysuria.  Musculoskeletal: Positive for back pain. Negative for neck pain.  All other systems reviewed and are negative.    Physical Exam Updated Vital Signs BP 127/90 (BP Location: Right Arm)   Pulse 77   Temp 98.8 F (37.1 C)   Resp 16   Ht 5\' 4"  (1.626 m)   Wt 71.3 kg   SpO2 98%   BMI 26.99 kg/m   Physical Exam  Constitutional: He is oriented to person, place, and  time. He appears well-developed and well-nourished. No distress.  HENT:  Head: Normocephalic and atraumatic.  Neck: Normal range of motion. Neck supple.  Cardiovascular: Normal rate and intact distal pulses.  Musculoskeletal:  There is diffuse tenderness to palpation over bilateral lumbar and lower thoracic back, primarily paraspinal.  No midline step-offs or deformities.  Left-sided upper lumbar/lower thoracic muscles are tight consistent with spasm.  Neurological: He is alert and oriented to person, place, and time.  Incision intact to bilateral lower legs, 5/5 strength with bilateral ankle dorsiflexion/plantar flexion.  Skin: Skin is warm. He is not diaphoretic.  Nursing note and vitals reviewed.    ED Treatments / Results  Labs (all labs ordered are listed, but only abnormal results are displayed) Labs Reviewed - No data to display  EKG None  Radiology No results found.  Procedures Procedures (including critical care time)  Medications Ordered in ED Medications  ibuprofen (ADVIL,MOTRIN) tablet 600 mg (has no administration in time range)     Initial Impression / Assessment and Plan / ED Course  I have reviewed the triage vital signs and the nursing notes.  Pertinent labs & imaging results that were available during my care of the patient were reviewed by me and considered in my medical decision making (see chart for details).    Patient with back pain.  No neurological deficits and normal neuro exam.  Patient can walk but states is painful.  No loss of bowel or bladder control.  No concern for cauda equina.  No fever, night sweats, weight loss, h/o cancer, IVDU.  RICE protocol and pain medicine indicated and discussed with patient.  We will give prescription for Robaxin as he states Flexeril has not worked well in the past.  Ibuprofen given in the department.  Return precautions were discussed with patient who states their understanding.  At the time of discharge patient  denied any unaddressed complaints or concerns.  Patient is agreeable for discharge home.   Final Clinical Impressions(s) / ED Diagnoses   Final diagnoses:  Acute bilateral low back pain without sciatica    ED Discharge Orders         Ordered    methocarbamol (ROBAXIN) 750 MG tablet  3 times daily PRN     09/13/17 0945           Cristina GongHammond, Edgar Corrigan W, [name] 09/13/17 [id number]    Eber HongMiller, Brian, MD 09/15/17 1018

## 2017-09-13 NOTE — Discharge Instructions (Addendum)
Please take Ibuprofen (Advil, motrin) and Tylenol (acetaminophen) to relieve your pain.  You may take up to 600 MG (3 pills) of normal strength ibuprofen every 8 hours as needed.  In between doses of ibuprofen you make take tylenol, up to 1,000 mg (two extra strength pills).  Do not take more than 3,000 mg tylenol in a 24 hour period.  Please check all medication labels as many medications such as pain and cold medications may contain tylenol.  Do not drink alcohol while taking these medications.  Do not take other NSAID'S while taking ibuprofen (such as aleve or naproxen).  Please take ibuprofen with food to decrease stomach upset.  You are being prescribed a medication which may make you sleepy. For 24 hours after one dose please do not drive, operate heavy machinery, care for a small child with out another adult present, or perform any activities that may cause harm to you or someone else if you were to fall asleep or be impaired.   The best way to get rid of muscle pain is by taking NSAIDS, using heat, massage therapy, and gentle stretching/range of motion exercises.  

## 2017-09-13 NOTE — ED Triage Notes (Signed)
Pt in c/o bil lower back pain onset last night after stretching, pt ambulatory, denies urinary symptoms, MAE, A&O x4

## 2017-10-22 ENCOUNTER — Encounter (HOSPITAL_COMMUNITY): Payer: Self-pay | Admitting: Emergency Medicine

## 2017-10-22 ENCOUNTER — Other Ambulatory Visit: Payer: Self-pay

## 2017-10-22 ENCOUNTER — Emergency Department (HOSPITAL_COMMUNITY)
Admission: EM | Admit: 2017-10-22 | Discharge: 2017-10-22 | Disposition: A | Discharge status: Home / Self Care | Payer: Medicaid Other | Attending: Emergency Medicine | Admitting: Emergency Medicine

## 2017-10-22 DIAGNOSIS — Z79899 Other long term (current) drug therapy: Secondary | ICD-10-CM | POA: Insufficient documentation

## 2017-10-22 DIAGNOSIS — M436 Torticollis: Secondary | ICD-10-CM | POA: Insufficient documentation

## 2017-10-22 DIAGNOSIS — F17210 Nicotine dependence, cigarettes, uncomplicated: Secondary | ICD-10-CM | POA: Insufficient documentation

## 2017-10-22 MED ORDER — CYCLOBENZAPRINE HCL 10 MG PO TABS
10.0000 mg | ORAL_TABLET | Freq: Once | ORAL | Status: AC
  Administered 2017-10-22: 10 mg via ORAL
  Filled 2017-10-22: qty 1

## 2017-10-22 MED ORDER — KETOROLAC TROMETHAMINE 30 MG/ML IJ SOLN
30.0000 mg | Freq: Once | INTRAMUSCULAR | Status: AC
  Administered 2017-10-22: 30 mg via INTRAMUSCULAR
  Filled 2017-10-22: qty 1

## 2017-10-22 MED ORDER — CYCLOBENZAPRINE HCL 10 MG PO TABS: 10.0000 mg | ORAL_TABLET | Freq: Two times a day (BID) | ORAL | 0 refills | Status: DC | PRN

## 2017-10-22 MED ORDER — NAPROXEN 500 MG PO TABS: 500.0000 mg | ORAL_TABLET | Freq: Two times a day (BID) | ORAL | 0 refills | Status: DC

## 2017-10-22 NOTE — ED Triage Notes (Signed)
Pt. Stated, I woke up yesterday morning with neck pain , its gotten worse.

## 2017-10-22 NOTE — Discharge Instructions (Addendum)
Take the medication as directed. Do not take the muscle relaxer if driving as it will make you sleepy. °

## 2017-10-22 NOTE — ED Provider Notes (Signed)
[facility] EMERGENCY DEPARTMENT Provider Note   CSN: [id number] Arrival date & time: 10/22/17  1423     History   Chief Complaint Chief Complaint  Patient presents with  . Neck Pain    HPI Phillip Mcconnell is a 37 y.o. male who presents to the ED with neck pain. Patient reports waking up with the pain yesterday morning and states it has gotten worse. Patient denies fever, chills or other problems.   HPI  History reviewed. No pertinent past medical history.  There are no active problems to display for this patient.   History reviewed. No pertinent surgical history.      Home Medications    Prior to Admission medications   Medication Sig Start Date End Date Taking? Authorizing Provider  acetaminophen (TYLENOL) 325 MG tablet Take 2 tablets (650 mg total) by mouth every 6 (six) hours as needed. 03/08/17   Couture, Cortni S, [name]  cyclobenzaprine (FLEXERIL) 10 MG tablet Take 1 tablet (10 mg total) by mouth 2 (two) times daily as needed for muscle spasms. 10/22/17   Janne Napoleon, NP  diphenhydramine-acetaminophen (TYLENOL PM) 25-500 MG TABS Take 2 tablets by mouth at bedtime as needed (for sleep).    [provider]  famotidine (PEPCID) 20 MG tablet Take 1 tablet (20 mg total) by mouth 2 (two) times daily. 04/02/17   [name], [name]  fluticasone (FLONASE) 50 MCG/ACT nasal spray Place 2 sprays into both nostrils daily. 03/08/17   Couture, Cortni S, [name]  guaiFENesin-dextromethorphan (ROBITUSSIN DM) 100-10 MG/5ML syrup Take 5 mLs by mouth every 4 (four) hours as needed for cough. 05/18/17   [name], [name]  ibuprofen (ADVIL,MOTRIN) 400 MG tablet Take 1 tablet (400 mg total) by mouth every 6 (six) hours as needed. 07/13/17   Couture, Cortni S, [name]  Lido-Capsaicin-Men-Methyl Sal 0.5-0.035-5-20 % PTCH Apply 1 patch topically daily. 02/09/17   [name], [name], [name]  loratadine (CLARITIN) 10 MG tablet Take 1 tablet (10 mg total) by mouth daily.  03/08/17   Couture, Cortni S, [name]  methocarbamol (ROBAXIN) 750 MG tablet Take 1-2 tablets (750-1,500 mg total) by mouth 3 (three) times daily as needed for muscle spasms. 09/13/17   Cristina Gong, [name]  naproxen (NAPROSYN) 500 MG tablet Take 1 tablet (500 mg total) by mouth 2 (two) times daily. 10/22/17   Janne Napoleon, NP  sodium chloride (OCEAN) 0.65 % SOLN nasal spray Place 1 spray into both nostrils as needed for congestion. 05/18/17   [name], [name]  sucralfate (CARAFATE) 1 g tablet Take 1 tablet (1 g total) by mouth 4 (four) times daily -  with meals and at bedtime. 04/02/17   [name], [name]    Family History No family history on file.  Social History Social History   Tobacco Use  . Smoking status: Current Every Day Smoker    Packs/day: 1.00    Types: Cigarettes  . Smokeless tobacco: Never Used  Substance Use Topics  . Alcohol use: Yes    Alcohol/week: 5.0 standard drinks    Types: 5 Cans of beer per week    Comment: occ  . Drug use: No     Allergies   Patient has no known allergies.   Review of Systems Review of Systems  Musculoskeletal: Positive for neck pain.  All other systems reviewed and are negative.    Physical Exam Updated Vital Signs BP (!) 154/104 (BP Location: Right Arm)  Pulse 66   Temp 97.8 F (36.6 C) (Oral)   Resp 16   Ht 5\' 4"  (1.626 m)   Wt 72.6 kg   SpO2 98%   BMI 27.46 kg/m   Physical Exam  Constitutional: He appears well-developed and well-nourished. No distress.  HENT:  Head: Normocephalic.  Eyes: EOM are normal.  Neck: Trachea normal. Neck supple. Muscular tenderness (right side) present. No spinous process tenderness present. No neck rigidity. Normal range of motion present.  Cardiovascular: Normal rate.  Pulmonary/Chest: Effort normal.  Musculoskeletal: Normal range of motion.  Neurological: He is alert.  Skin: Skin is warm and dry.  Nursing note and vitals reviewed.    ED Treatments / Results   Labs (all labs ordered are listed, but only abnormal results are displayed) Labs Reviewed - No data to display  Radiology No results found.  Procedures Procedures (including critical care time)  Medications Ordered in ED Medications  ketorolac (TORADOL) 30 MG/ML injection 30 mg (30 mg Intramuscular Given 10/22/17 1718)  cyclobenzaprine (FLEXERIL) tablet 10 mg (10 mg Oral Given 10/22/17 1718)   Pain improved significantly with treatment in the ED. Patient stable for d/c without meningeal signs. He will f/u with his PCP or return for worsening symptoms.   Initial Impression / Assessment and Plan / ED Course  I have reviewed the triage vital signs and the nursing notes.   Final Clinical Impressions(s) / ED Diagnoses   Final diagnoses:  Torticollis    ED Discharge Orders         Ordered    cyclobenzaprine (FLEXERIL) 10 MG tablet  2 times daily PRN     10/22/17 1820    naproxen (NAPROSYN) 500 MG tablet  2 times daily     10/22/17 [address]           Lynnell Fiumara, GreenfieldHope M, NP 10/23/17 [id number]    Alvira MondaySchlossman, Erin, MD 10/23/17 [id number]

## 2017-11-15 ENCOUNTER — Other Ambulatory Visit: Payer: Self-pay

## 2017-11-15 ENCOUNTER — Emergency Department (HOSPITAL_COMMUNITY)
Admission: EM | Admit: 2017-11-15 | Discharge: 2017-11-15 | Discharge status: Against Medical Advice | Payer: Medicaid Other

## 2017-11-15 NOTE — ED Notes (Signed)
Called for vitals, no answer 

## 2017-11-15 NOTE — ED Notes (Signed)
Pt not seen in lobby. Called for triage x4.

## 2017-12-03 ENCOUNTER — Emergency Department (HOSPITAL_COMMUNITY)
Admission: EM | Admit: 2017-12-03 | Discharge: 2017-12-03 | Disposition: A | Discharge status: Home / Self Care | Payer: Medicaid Other | Attending: Emergency Medicine | Admitting: Emergency Medicine

## 2017-12-03 ENCOUNTER — Encounter (HOSPITAL_COMMUNITY): Payer: Self-pay | Admitting: Emergency Medicine

## 2017-12-03 ENCOUNTER — Other Ambulatory Visit: Payer: Self-pay

## 2017-12-03 DIAGNOSIS — M545 Low back pain, unspecified: Secondary | ICD-10-CM

## 2017-12-03 DIAGNOSIS — G8929 Other chronic pain: Secondary | ICD-10-CM | POA: Insufficient documentation

## 2017-12-03 DIAGNOSIS — X509XXA Other and unspecified overexertion or strenuous movements or postures, initial encounter: Secondary | ICD-10-CM | POA: Insufficient documentation

## 2017-12-03 DIAGNOSIS — Y9289 Other specified places as the place of occurrence of the external cause: Secondary | ICD-10-CM | POA: Insufficient documentation

## 2017-12-03 DIAGNOSIS — F17210 Nicotine dependence, cigarettes, uncomplicated: Secondary | ICD-10-CM | POA: Insufficient documentation

## 2017-12-03 DIAGNOSIS — Y9389 Activity, other specified: Secondary | ICD-10-CM | POA: Insufficient documentation

## 2017-12-03 DIAGNOSIS — Y999 Unspecified external cause status: Secondary | ICD-10-CM | POA: Insufficient documentation

## 2017-12-03 DIAGNOSIS — Z79899 Other long term (current) drug therapy: Secondary | ICD-10-CM | POA: Insufficient documentation

## 2017-12-03 HISTORY — DX: Other chronic pain: G89.29

## 2017-12-03 HISTORY — DX: Dorsalgia, unspecified: M54.9

## 2017-12-03 MED ORDER — LIDOCAINE 5 % EX PTCH
1.0000 | MEDICATED_PATCH | CUTANEOUS | Status: DC
  Administered 2017-12-03: 1 via TRANSDERMAL
  Filled 2017-12-03: qty 1

## 2017-12-03 NOTE — ED Notes (Signed)
Pt c/o back pain after helping someone move.

## 2017-12-03 NOTE — Discharge Instructions (Addendum)
Follow-up with a primary care provider, referral given.  Recommend a course of physical therapy due to numerous visits to the emergency room for back pain. Lidoderm patch applied to your back today while in the emergency room, similar patches are available over-the-counter with lidocaine and/or capsaicin for pain relief. Take Aleve as needed as directed.  You may add Tylenol to this for additional pain relief if needed. Apply warm compresses to low back for 20 minutes at a time.

## 2017-12-03 NOTE — ED Provider Notes (Signed)
[facility] EMERGENCY DEPARTMENT Provider Note   CSN: [id number] Arrival date & time: 12/03/17  1145     History   Chief Complaint Chief Complaint  Patient presents with  . Back Pain    HPI Phillip Mcconnell is a 37 y.o. male.  37 year old male presents with complaint of right low back pain, worse with movement, does not radiate.  Was seen very frequently in this emergency room with similar musculoskeletal complaints.  Patient states that he was helping his sister move last week and his back has been aching ever since.  Patient took OTC ibuprofen without much relief, states he had to naproxen leftover and that helped more however he has run out.  Patient denies IV drug use, fevers, history of immune compromise.  He denies abdominal pain, changes in bowel or bladder habits, groin numbness.  Denies traumatic injury.  No other complaints or concerns.     Past Medical History:  Diagnosis Date  . Chronic back pain     There are no active problems to display for this patient.   History reviewed. No pertinent surgical history.      Home Medications    Prior to Admission medications   Medication Sig Start Date End Date Taking? Authorizing Provider  acetaminophen (TYLENOL) 325 MG tablet Take 2 tablets (650 mg total) by mouth every 6 (six) hours as needed. 03/08/17   Couture, Cortni S, [name]  cyclobenzaprine (FLEXERIL) 10 MG tablet Take 1 tablet (10 mg total) by mouth 2 (two) times daily as needed for muscle spasms. 10/22/17   Janne Napoleon, NP  diphenhydramine-acetaminophen (TYLENOL PM) 25-500 MG TABS Take 2 tablets by mouth at bedtime as needed (for sleep).    [provider]  famotidine (PEPCID) 20 MG tablet Take 1 tablet (20 mg total) by mouth 2 (two) times daily. 04/02/17   [name], [name]  fluticasone (FLONASE) 50 MCG/ACT nasal spray Place 2 sprays into both nostrils daily. 03/08/17   Couture, Cortni S, [name]  guaiFENesin-dextromethorphan (ROBITUSSIN  DM) 100-10 MG/5ML syrup Take 5 mLs by mouth every 4 (four) hours as needed for cough. 05/18/17   [name], [name]  ibuprofen (ADVIL,MOTRIN) 400 MG tablet Take 1 tablet (400 mg total) by mouth every 6 (six) hours as needed. 07/13/17   Couture, Cortni S, [name]  Lido-Capsaicin-Men-Methyl Sal 0.5-0.035-5-20 % PTCH Apply 1 patch topically daily. 02/09/17   [name], [name], [name]  loratadine (CLARITIN) 10 MG tablet Take 1 tablet (10 mg total) by mouth daily. 03/08/17   Couture, Cortni S, [name]  methocarbamol (ROBAXIN) 750 MG tablet Take 1-2 tablets (750-1,500 mg total) by mouth 3 (three) times daily as needed for muscle spasms. 09/13/17   Cristina Gong, [name]  naproxen (NAPROSYN) 500 MG tablet Take 1 tablet (500 mg total) by mouth 2 (two) times daily. 10/22/17   Janne Napoleon, NP  sodium chloride (OCEAN) 0.65 % SOLN nasal spray Place 1 spray into both nostrils as needed for congestion. 05/18/17   [name], [name]  sucralfate (CARAFATE) 1 g tablet Take 1 tablet (1 g total) by mouth 4 (four) times daily -  with meals and at bedtime. 04/02/17   [name], [name]    Family History History reviewed. No pertinent family history.  Social History Social History   Tobacco Use  . Smoking status: Current Every Day Smoker    Packs/day: 1.00    Types: Cigarettes  . Smokeless tobacco: Never Used  Substance  Use Topics  . Alcohol use: Yes    Alcohol/week: 5.0 standard drinks    Types: 5 Cans of beer per week    Comment: occ  . Drug use: No     Allergies   Patient has no known allergies.   Review of Systems Review of Systems  Constitutional: Negative for chills and fever.  Gastrointestinal: Negative for abdominal distention, abdominal pain and constipation.  Genitourinary: Negative for difficulty urinating.  Musculoskeletal: Positive for back pain. Negative for arthralgias and gait problem.  Skin: Negative for rash and wound.  Allergic/Immunologic: Negative for immunocompromised  state.  Neurological: Negative for weakness and numbness.  Hematological: Negative for adenopathy. Does not bruise/bleed easily.  Psychiatric/Behavioral: Negative for confusion.  All other systems reviewed and are negative.    Physical Exam Updated Vital Signs BP (!) 136/99 (BP Location: Right Arm)   Pulse 83   Temp 98.2 F (36.8 C) (Oral)   Resp 18   Ht 5\' 4"  (1.626 m)   Wt 72.6 kg   SpO2 97%   BMI 27.46 kg/m   Physical Exam  Constitutional: He is oriented to person, place, and time. He appears well-developed and well-nourished. No distress.  HENT:  Head: Normocephalic and atraumatic.  Cardiovascular: Intact distal pulses.  Pulmonary/Chest: Effort normal.  Abdominal: Soft. There is no tenderness.  Musculoskeletal: Normal range of motion. He exhibits tenderness. He exhibits no deformity.       Thoracic back: He exhibits no tenderness and no bony tenderness.       Lumbar back: He exhibits tenderness. He exhibits normal range of motion, no bony tenderness, no swelling and no edema.       Back:  Left and right paraspinous tenderness to the low back, no midline or bony tenderness.  Straight leg raise negative.  Reflexes symmetric.  Leg strength symmetric.  Neurological: He is alert and oriented to person, place, and time. He displays normal reflexes. No sensory deficit. He exhibits normal muscle tone. Gait normal. GCS eye subscore is 4. GCS verbal subscore is 5. GCS motor subscore is 6.  Reflex Scores:      Patellar reflexes are 2+ on the right side and 2+ on the left side.      Achilles reflexes are 2+ on the right side and 2+ on the left side. Skin: Skin is warm and dry. No rash noted. He is not diaphoretic. No pallor.  Psychiatric: He has a normal mood and affect. His behavior is normal.  Nursing note and vitals reviewed.    ED Treatments / Results  Labs (all labs ordered are listed, but only abnormal results are displayed) Labs Reviewed - No data to  display  EKG None  Radiology No results found.  Procedures Procedures (including critical care time)  Medications Ordered in ED Medications  lidocaine (LIDODERM) 5 % 1 patch (1 patch Transdermal Patch Applied 12/03/17 1322)     Initial Impression / Assessment and Plan / ED Course  I have reviewed the triage vital signs and the nursing notes.  Pertinent labs & imaging results that were available during my care of the patient were reviewed by me and considered in my medical decision making (see chart for details).  Clinical Course as of Dec 04 1322  Sat Dec 03, 2017  [id number] 37 year old male with low back pain after helping his sister move a few days ago without traumatic injury noted.  Exam he has left and right paraspinous tenderness, no midline or bony tenderness, no red  flag symptoms.  Patient has numerous visits to the emergency room with similar complaints previously, generally prescribed/requests muscle relaxers.  Patient reports his pain improved with taking naproxen but states he ran out of the 2 he had leftover from a previous prescription.  Advised patient this medication is available over-the-counter as Aleve, advised he can take this as well as Tylenol if needed, also recommend OTC lidocaine patches and warm compresses.  Strongly encouraged follow-up with primary care provider.   [LM]    Clinical Course User Index [LM] Jeannie Fend, [name]   Final Clinical Impressions(s) / ED Diagnoses   Final diagnoses:  Chronic right-sided low back pain without sciatica    ED Discharge Orders    None       Jeannie Fend, [name] 12/03/17 1324    Tilden Fossa, MD 12/04/17 [phone]

## 2017-12-03 NOTE — ED Triage Notes (Signed)
Pt presents to ED for assessment of lower back pain since Tuesday when he helped his sister move.  Denies a "pop" or anything, but states the next morning he woke up and his lower mid back was hurting.  Worked through the week, states ibuprofen is not helping at all.  Patient denies loss of bowel or bladder control.

## 2017-12-16 ENCOUNTER — Encounter (HOSPITAL_COMMUNITY): Payer: Self-pay | Admitting: Emergency Medicine

## 2017-12-16 ENCOUNTER — Emergency Department (HOSPITAL_COMMUNITY)
Admission: EM | Admit: 2017-12-16 | Discharge: 2017-12-16 | Disposition: A | Discharge status: Home / Self Care | Payer: Medicaid Other | Attending: Emergency Medicine | Admitting: Emergency Medicine

## 2017-12-16 ENCOUNTER — Other Ambulatory Visit: Payer: Self-pay

## 2017-12-16 DIAGNOSIS — R030 Elevated blood-pressure reading, without diagnosis of hypertension: Secondary | ICD-10-CM | POA: Insufficient documentation

## 2017-12-16 DIAGNOSIS — F17210 Nicotine dependence, cigarettes, uncomplicated: Secondary | ICD-10-CM | POA: Insufficient documentation

## 2017-12-16 DIAGNOSIS — Z79899 Other long term (current) drug therapy: Secondary | ICD-10-CM | POA: Insufficient documentation

## 2017-12-16 DIAGNOSIS — M25511 Pain in right shoulder: Secondary | ICD-10-CM | POA: Insufficient documentation

## 2017-12-16 MED ORDER — NAPROXEN 500 MG PO TABS: 500.0000 mg | ORAL_TABLET | Freq: Two times a day (BID) | ORAL | 0 refills | Status: DC

## 2017-12-16 NOTE — ED Triage Notes (Signed)
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 Pt stated today it had gotten worse and harder to move up and down. Has good range of motion.

## 2017-12-16 NOTE — Discharge Instructions (Addendum)
You were evaluated today for right shoulder pain.  This is most likely an overuse injury.  Please take the naproxen I have prescribed you as prescribed.  You may also ice, rest, elevate the affected extremity.  He did have a mildly elevated blood pressure in the department.  I recommend follow-up with your PCP for reevaluation.  Return to the ED for new or worsening symptoms.

## 2017-12-16 NOTE — ED Provider Notes (Signed)
[facility] EMERGENCY DEPARTMENT Provider Note   CSN: [id number] Arrival date & time: 12/16/17  1850     History   Chief Complaint Chief Complaint  Patient presents with  . Shoulder Pain    HPI Phillip Mcconnell is a 37 y.o. male with past medical history significant for chronic low back pain who presents for evaluation of right shoulder pain.  Patient states that he has had right shoulder pain x2 days.  Denies injury or trauma to the area.  Patient states that he does do repetitive motions at work with pushing and pulling.  Patient states that he went to go in today and when he started working he was unable to perform the job because his shoulder was "throbbing."  Patient was told by his employer to present to the emergency department for evaluation before he can return to work.  Patient rates his pain a 5/10.  Patient states if he does not use his arm or his shoulder at the pain dissipates.  Patient states pain worsens when he moves it.  Denies fever, chills, nausea, vomiting, numbness or tingling in his extremities, decreased range of motion. Patient states he has had something similar previously except it was on his left shoulder.  Denies radiation of his pain.  Patient states he has not taken anything for this pain.  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  Denies redness, warmth or swelling to bilateral upper extremities.  Denies IV drug use, history of gout or anticoagulation.  History obtained from patient.  No interpreter was used.  HPI  Past Medical History:  Diagnosis Date  . Chronic back pain     There are no active problems to display for this patient.   History reviewed. No pertinent surgical history.      Home Medications    Prior to Admission medications   Medication Sig Start Date End Date Taking? Authorizing Provider  acetaminophen (TYLENOL) 325 MG tablet Take 2 tablets (650 mg total) by mouth every 6 (six) hours as  needed. 03/08/17   Couture, Cortni S, [name]  cyclobenzaprine (FLEXERIL) 10 MG tablet Take 1 tablet (10 mg total) by mouth 2 (two) times daily as needed for muscle spasms. 10/22/17   Janne Napoleon, NP  diphenhydramine-acetaminophen (TYLENOL PM) 25-500 MG TABS Take 2 tablets by mouth at bedtime as needed (for sleep).    [provider]  famotidine (PEPCID) 20 MG tablet Take 1 tablet (20 mg total) by mouth 2 (two) times daily. 04/02/17   [name], [name]  fluticasone (FLONASE) 50 MCG/ACT nasal spray Place 2 sprays into both nostrils daily. 03/08/17   Couture, Cortni S, [name]  guaiFENesin-dextromethorphan (ROBITUSSIN DM) 100-10 MG/5ML syrup Take 5 mLs by mouth every 4 (four) hours as needed for cough. 05/18/17   [name], [name]  ibuprofen (ADVIL,MOTRIN) 400 MG tablet Take 1 tablet (400 mg total) by mouth every 6 (six) hours as needed. 07/13/17   Couture, Cortni S, [name]  Lido-Capsaicin-Men-Methyl Sal 0.5-0.035-5-20 % PTCH Apply 1 patch topically daily. 02/09/17   [name], [name], [name]  loratadine (CLARITIN) 10 MG tablet Take 1 tablet (10 mg total) by mouth daily. 03/08/17   Couture, Cortni S, [name]  methocarbamol (ROBAXIN) 750 MG tablet Take 1-2 tablets (750-1,500 mg total) by mouth 3 (three) times daily as needed for muscle spasms. 09/13/17   Cristina Gong, [name]  naproxen (NAPROSYN) 500 MG tablet Take 1 tablet (500 mg  total) by mouth 2 (two) times daily. 12/16/17   Henderly, Britni A, [name]  sodium chloride (OCEAN) 0.65 % SOLN nasal spray Place 1 spray into both nostrils as needed for congestion. 05/18/17   Mathews RobinsonsMitchell, Jessica B, [name]  sucralfate (CARAFATE) 1 g tablet Take 1 tablet (1 g total) by mouth 4 (four) times daily -  with meals and at bedtime. 04/02/17   [name], [name], [name]    Family History History reviewed. No pertinent family history.  Social History Social History   Tobacco Use  . Smoking status: Current Every Day Smoker    Packs/day: 1.00    Types: Cigarettes  .  Smokeless tobacco: Never Used  Substance Use Topics  . Alcohol use: Yes    Alcohol/week: 5.0 standard drinks    Types: 5 Cans of beer per week    Comment: occ  . Drug use: No     Allergies   Patient has no known allergies.   Review of Systems Review of Systems  Constitutional: Negative.   Respiratory: Negative.   Cardiovascular: Negative.   Gastrointestinal: Negative.   Genitourinary: Negative.   Musculoskeletal: Negative for arthralgias, back pain, gait problem, joint swelling, myalgias, neck pain and neck stiffness.       Right shoulder pain.  Skin: Negative.   All other systems reviewed and are negative.    Physical Exam Updated Vital Signs BP (!) 142/96 (BP Location: Right Arm)   Pulse 94   Temp 98.7 F (37.1 C) (Oral)   Resp 16   Ht 5\' 4"  (1.626 m)   Wt 72.1 kg   SpO2 96%   BMI 27.29 kg/m   Physical Exam  Constitutional: He appears well-developed and well-nourished. No distress.  HENT:  Head: Atraumatic.  Mouth/Throat: Oropharynx is clear and moist.  Eyes: Pupils are equal, round, and reactive to light.  Neck: Normal range of motion and full passive range of motion without pain. Neck supple. No spinous process tenderness and no muscular tenderness present. No neck rigidity. No edema, no erythema and normal range of motion present.  No midline neck tenderness palpation.  Full range of motion neck without difficulty or pain.  No neck rigidity or stiffness.  Cardiovascular: Normal rate, regular rhythm, normal heart sounds and intact distal pulses. Exam reveals no gallop and no friction rub.  No murmur heard. Pulmonary/Chest: Effort normal and breath sounds normal. No stridor. No respiratory distress. He has no wheezes.  Abdominal: Soft. Bowel sounds are normal. He exhibits no distension. There is no tenderness.  Musculoskeletal: Normal range of motion.       Right shoulder: Normal.  Full range of motion bilateral upper extremities with flexion, extension,  abduction and adduction.  Negative Hawkins and empty can to right shoulder.  No tenderness palpation to right shoulder. No bony tenderness. No obvious effusion. No crepitus.  Neurological: He is alert. He has normal strength and normal reflexes. No sensory deficit.  5/5 strength to bilateral upper extremities.  Intact sensation to sharp and dull.  Skin: Skin is warm and dry. He is not diaphoretic.  No edema, erythema, ecchymosis or warmth.  No rashes or lesions.  Psychiatric: He has a normal mood and affect.  Nursing note and vitals reviewed.    ED Treatments / Results  Labs (all labs ordered are listed, but only abnormal results are displayed) Labs Reviewed - No data to display  EKG None  Radiology No results found.  Procedures Procedures (including critical care time)  Medications Ordered  in ED Medications - No data to display   Initial Impression / Assessment and Plan / ED Course  I have reviewed the triage vital signs and the nursing notes.  Pertinent labs & imaging results that were available during my care of the patient were reviewed by me and considered in my medical decision making (see chart for details).  37 year old male who appears otherwise well presents for evaluation of right shoulder pain.  Afebrile, nonseptic, non-ill-appearing.  No known trauma or injury.  Pain worse with repetitive motion.  No edema, erythema, ecchymosis or warmth.  Normal musculoskeletal exam.  Neurovascularly intact.  Negative Hawkins and empty can test.  Low suspicion for septic joint, fracture, dislocation, gouty joint, hemarthrosis, ligamentous tear, DVT.  Extremity compartments are soft.  Low suspicion for neck pathology causing patient's pain.  Patient with most likely overuse injury.  Discussed with patient use of anti-inflammatories as well as RICE protocol.  Patient voiced understanding is agreeable for follow-up.  Patient does have a mildly elevated blood pressure in the department.   Discussed with patient follow-up with PCP for reevaluation.  Denies headache, vision changes, chest pain or shortness of breath.  Patient is hemodynamically stable and appropriate for DC home at this time.  Discussed return precautions.  Patient voiced understanding and is agreeable for follow-up.    Final Clinical Impressions(s) / ED Diagnoses   Final diagnoses:  Acute pain of right shoulder    ED Discharge Orders         Ordered    naproxen (NAPROSYN) 500 MG tablet  2 times daily     12/16/17 1943           Henderly, Britni A, [name] 12/16/17 1947    Vanetta Mulders, MD 12/20/17 1754

## 2018-01-13 ENCOUNTER — Emergency Department (HOSPITAL_COMMUNITY)
Admission: EM | Admit: 2018-01-13 | Discharge: 2018-01-13 | Disposition: A | Discharge status: Home / Self Care | Payer: Self-pay | Attending: Emergency Medicine | Admitting: Emergency Medicine

## 2018-01-13 ENCOUNTER — Other Ambulatory Visit: Payer: Self-pay

## 2018-01-13 DIAGNOSIS — H9201 Otalgia, right ear: Secondary | ICD-10-CM | POA: Insufficient documentation

## 2018-01-13 DIAGNOSIS — Z79899 Other long term (current) drug therapy: Secondary | ICD-10-CM | POA: Insufficient documentation

## 2018-01-13 DIAGNOSIS — F17210 Nicotine dependence, cigarettes, uncomplicated: Secondary | ICD-10-CM | POA: Insufficient documentation

## 2018-01-13 MED ORDER — NAPROXEN 250 MG PO TABS
500.0000 mg | ORAL_TABLET | Freq: Once | ORAL | Status: AC
  Administered 2018-01-13: 500 mg via ORAL
  Filled 2018-01-13: qty 2

## 2018-01-13 MED ORDER — NAPROXEN 500 MG PO TABS: 500.0000 mg | ORAL_TABLET | Freq: Two times a day (BID) | ORAL | 0 refills | Status: DC

## 2018-01-13 MED ORDER — CIPROFLOXACIN-HYDROCORTISONE 0.2-1 % OT SUSP: 3.0000 [drp] | Freq: Two times a day (BID) | OTIC | 0 refills | Status: AC

## 2018-01-13 NOTE — ED Notes (Signed)
Patient verbalizes understanding of discharge instructions. Opportunity for questioning and answers were provided. Armband removed by staff, pt discharged from ED.  

## 2018-01-13 NOTE — ED Triage Notes (Signed)
Pt started having right shoulder pain and right ear pain about 3 days ago. Pain 7 out of 10. No chest pain.

## 2018-01-13 NOTE — Discharge Instructions (Signed)
Thank you for allowing me to care for you today in the Emergency Department.   Place 3 drops into the right ear 2 times daily for the next 7 days.  I have attached instructions on how to make sure the drops stay in your ear.  Take 2 tablets of naproxen 2 times daily.  Make sure to take this with food so that it does not upset your stomach.  Call the primary care clinic listed above to schedule follow-up appointment if your ear does not improve and for further evaluation of your shoulder.  Return to the emergency department if you develop changes in your vision, a high fever, if you become unable to move your neck, or other new, concerning symptoms.

## 2018-01-13 NOTE — ED Provider Notes (Signed)
[facility] EMERGENCY DEPARTMENT Provider Note   CSN: [id number] Arrival date & time: 01/13/18  1913     History   Chief Complaint Chief Complaint  Patient presents with  . Shoulder Pain  . Otalgia    HPI Phillip Mcconnell is a 37 y.o. male of chronic back and right shoulder pain who presents to the emergency department with a chief complaint of right ear pain.  No known aggravating or alleviating factors.  Pain is constant.  The patient endorses right otalgia that began 3 days ago.  He states that he has been having some drainage from the right ear that appeared to have some discoloration.  Otorrhea began yesterday along with tinnitus no.  He denies sinus pain or pressure, visual changes, fever, chills, neck pain or stiffness, headache, dizziness, lightheadedness.  He also reports that his chronic right shoulder pain has been flaring up due to the change in weather.  He is also out of his home naproxen.  He is requesting a refill.  The history is provided by the patient. No language interpreter was used.  Otalgia  Associated symptoms include ear discharge. Pertinent negatives include no sore throat, no abdominal pain, no neck pain and no rash.    Past Medical History:  Diagnosis Date  . Chronic back pain     There are no active problems to display for this patient.   No past surgical history on file.      Home Medications    Prior to Admission medications   Medication Sig Start Date End Date Taking? Authorizing Provider  acetaminophen (TYLENOL) 325 MG tablet Take 2 tablets (650 mg total) by mouth every 6 (six) hours as needed. 03/08/17   Couture, Cortni S, [name]  ciprofloxacin-hydrocortisone (CIPRO HC) OTIC suspension Place 3 drops into the right ear 2 (two) times daily for 7 days. 01/13/18 01/20/18  Rozalia Dino A, [name]  cyclobenzaprine (FLEXERIL) 10 MG tablet Take 1 tablet (10 mg total) by mouth 2 (two) times daily as needed for muscle spasms. 10/22/17    Janne Napoleon, NP  diphenhydramine-acetaminophen (TYLENOL PM) 25-500 MG TABS Take 2 tablets by mouth at bedtime as needed (for sleep).    [provider]  famotidine (PEPCID) 20 MG tablet Take 1 tablet (20 mg total) by mouth 2 (two) times daily. 04/02/17   [name], [name]  fluticasone (FLONASE) 50 MCG/ACT nasal spray Place 2 sprays into both nostrils daily. 03/08/17   Couture, Cortni S, [name]  guaiFENesin-dextromethorphan (ROBITUSSIN DM) 100-10 MG/5ML syrup Take 5 mLs by mouth every 4 (four) hours as needed for cough. 05/18/17   [name], [name]  ibuprofen (ADVIL,MOTRIN) 400 MG tablet Take 1 tablet (400 mg total) by mouth every 6 (six) hours as needed. 07/13/17   Couture, Cortni S, [name]  Lido-Capsaicin-Men-Methyl Sal 0.5-0.035-5-20 % PTCH Apply 1 patch topically daily. 02/09/17   [name], [name], [name]  loratadine (CLARITIN) 10 MG tablet Take 1 tablet (10 mg total) by mouth daily. 03/08/17   Couture, Cortni S, [name]  methocarbamol (ROBAXIN) 750 MG tablet Take 1-2 tablets (750-1,500 mg total) by mouth 3 (three) times daily as needed for muscle spasms. 09/13/17   Cristina Gong, [name]  naproxen (NAPROSYN) 500 MG tablet Take 1 tablet (500 mg total) by mouth 2 (two) times daily. 01/13/18   Terriyah Westra A, [name]  sodium chloride (OCEAN) 0.65 % SOLN nasal spray Place 1 spray into both nostrils as needed for congestion. 05/18/17  [name], [name]  sucralfate (CARAFATE) 1 g tablet Take 1 tablet (1 g total) by mouth 4 (four) times daily -  with meals and at bedtime. 04/02/17   [name], [name]    Family History No family history on file.  Social History Social History   Tobacco Use  . Smoking status: Current Every Day Smoker    Packs/day: 1.00    Types: Cigarettes  . Smokeless tobacco: Never Used  Substance Use Topics  . Alcohol use: Yes    Alcohol/week: 5.0 standard drinks    Types: 5 Cans of beer per week    Comment: occ  . Drug use: No     Allergies     Patient has no known allergies.   Review of Systems Review of Systems  Constitutional: Negative for activity change, chills and fever.  HENT: Positive for ear discharge and ear pain. Negative for congestion, drooling, sinus pressure, sinus pain and sore throat.   Eyes: Negative for visual disturbance.  Respiratory: Negative for shortness of breath.   Cardiovascular: Negative for chest pain.  Gastrointestinal: Negative for abdominal pain.  Musculoskeletal: Positive for arthralgias and myalgias. Negative for back pain, joint swelling, neck pain and neck stiffness.  Skin: Negative for rash.  Neurological: Negative for weakness and numbness.     Physical Exam Updated Vital Signs BP (!) 164/110   Pulse 61   Temp 98.3 F (36.8 C) (Oral)   Resp 14   Ht 5\' 4"  (1.626 m)   Wt 72.6 kg   SpO2 100%   BMI 27.46 kg/m   Physical Exam Vitals signs and nursing note reviewed.  Constitutional:      Appearance: He is well-developed.  HENT:     Head: Normocephalic.     Right Ear: Tympanic membrane and external ear normal. There is no impacted cerumen.     Left Ear: Tympanic membrane and external ear normal. There is no impacted cerumen.     Ears:     Comments: No obvious otorrhea in the bilateral canals.  No mastoid tenderness bilaterally.  No pain with movement of the auricle or tragus.    Nose: No congestion or rhinorrhea.     Mouth/Throat:     Mouth: Mucous membranes are dry.     Pharynx: Oropharynx is clear. No oropharyngeal exudate or posterior oropharyngeal erythema.  Eyes:     Extraocular Movements: Extraocular movements intact.     Conjunctiva/sclera: Conjunctivae normal.     Pupils: Pupils are equal, round, and reactive to light.  Neck:     Musculoskeletal: Normal range of motion and neck supple.  Cardiovascular:     Rate and Rhythm: Normal rate and regular rhythm.     Heart sounds: No murmur.  Pulmonary:     Effort: Pulmonary effort is normal.     Breath sounds: No  wheezing, rhonchi or rales.  Abdominal:     General: There is no distension.     Palpations: Abdomen is soft.     Tenderness: There is no abdominal tenderness.  Musculoskeletal:     Comments: Normal exam of the bilateral upper extremities.  He is neurovascularly intact.  Skin:    General: Skin is warm and dry.  Neurological:     Mental Status: He is alert.  Psychiatric:        Behavior: Behavior normal.      ED Treatments / Results  Labs (all labs ordered are listed, but only abnormal results are displayed) Labs Reviewed -  No data to display  EKG None  Radiology No results found.  Procedures Procedures (including critical care time)  Medications Ordered in ED Medications  naproxen (NAPROSYN) tablet 500 mg (500 mg Oral Given 01/13/18 2236)     Initial Impression / Assessment and Plan / ED Course  I have reviewed the triage vital signs and the nursing notes.  Pertinent labs & imaging results that were available during my care of the patient were reviewed by me and considered in my medical decision making (see chart for details).     37 year old male with a history of chronic right shoulder pain presenting with right otalgia and otorrhea and tinnitus.  He is also requesting a prescription for naproxen.  On my exam, no AOM.  TM is normal.  No mastoid tenderness.  I do not see any otorrhea or bloody discharge on my exam.  However, since the patient has endorsed discolored drainage for the last day, will cover the patient with ciprofloxacin drops.  Strict return precautions given.  He is hemodynamically stable and in no acute distress.  He has been given a referral to primary care for follow-up.  He is safe for discharge home with outpatient follow-up at this time.  Final Clinical Impressions(s) / ED Diagnoses   Final diagnoses:  Otalgia, right ear    ED Discharge Orders         Ordered    naproxen (NAPROSYN) 500 MG tablet  2 times daily     01/13/18 2235     ciprofloxacin-hydrocortisone (CIPRO HC) OTIC suspension  2 times daily     01/13/18 2235           Frederik PearMcDonald, Jaspal Pultz A, [name] 01/14/18 [id number]    Tilden Fossaees, Elizabeth, MD 01/15/18 [phone]

## 2018-01-18 ENCOUNTER — Encounter (HOSPITAL_COMMUNITY): Payer: Self-pay | Admitting: Emergency Medicine

## 2018-01-18 ENCOUNTER — Other Ambulatory Visit: Payer: Self-pay

## 2018-01-18 ENCOUNTER — Emergency Department (HOSPITAL_COMMUNITY)
Admission: EM | Admit: 2018-01-18 | Discharge: 2018-01-18 | Disposition: A | Discharge status: Home / Self Care | Payer: Self-pay | Attending: Emergency Medicine | Admitting: Emergency Medicine

## 2018-01-18 DIAGNOSIS — Z760 Encounter for issue of repeat prescription: Secondary | ICD-10-CM | POA: Insufficient documentation

## 2018-01-18 DIAGNOSIS — Z79899 Other long term (current) drug therapy: Secondary | ICD-10-CM | POA: Insufficient documentation

## 2018-01-18 DIAGNOSIS — F17210 Nicotine dependence, cigarettes, uncomplicated: Secondary | ICD-10-CM | POA: Insufficient documentation

## 2018-01-18 DIAGNOSIS — G8929 Other chronic pain: Secondary | ICD-10-CM | POA: Insufficient documentation

## 2018-01-18 DIAGNOSIS — M25511 Pain in right shoulder: Secondary | ICD-10-CM

## 2018-01-18 MED ORDER — CYCLOBENZAPRINE HCL 10 MG PO TABS: 10.0000 mg | ORAL_TABLET | Freq: Two times a day (BID) | ORAL | 0 refills | Status: DC | PRN

## 2018-01-18 NOTE — ED Notes (Signed)
Patient verbalizes understanding of discharge instructions. Opportunity for questioning and answers were provided. Armband removed by staff, pt discharged from ED ambulatory.   

## 2018-01-18 NOTE — Discharge Instructions (Signed)
You can establish care with a primary care provider with 1 of the offices below.  They can refill your medicine in the future.  Please return the emergency department if you develop any new or worsening symptoms.

## 2018-01-18 NOTE — ED Provider Notes (Signed)
[facility] EMERGENCY DEPARTMENT Provider Note   CSN: [id number] Arrival date & time: 01/18/18  1802     History   Chief Complaint Chief Complaint  Patient presents with  . Medication Refill    HPI Phillip Mcconnell is a 37 y.o. male with history of chronic right shoulder pain and upper back pain who presents for medication refill of Flexeril.  Patient reports he was here the other day and requested refill of Naprosyn, however he meant to request Flexeril.  He has history of right shoulder pain that he has had for years.  It is unchanged.  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  He denies any numbness or tingling.  His pain is typical of normal.  He does not currently have a PCP and states he works third shift and following up is difficult for him.  HPI  Past Medical History:  Diagnosis Date  . Chronic back pain     There are no active problems to display for this patient.   History reviewed. No pertinent surgical history.      Home Medications    Prior to Admission medications   Medication Sig Start Date End Date Taking? Authorizing Provider  acetaminophen (TYLENOL) 325 MG tablet Take 2 tablets (650 mg total) by mouth every 6 (six) hours as needed. 03/08/17   Couture, Cortni S, [name]  ciprofloxacin-hydrocortisone (CIPRO HC) OTIC suspension Place 3 drops into the right ear 2 (two) times daily for 7 days. 01/13/18 01/20/18  McDonald, Mia A, [name]  cyclobenzaprine (FLEXERIL) 10 MG tablet Take 1 tablet (10 mg total) by mouth 2 (two) times daily as needed for muscle spasms. 01/18/18   Aurel Nguyen, Waylan Boga, [name]  diphenhydramine-acetaminophen (TYLENOL PM) 25-500 MG TABS Take 2 tablets by mouth at bedtime as needed (for sleep).    [provider]  famotidine (PEPCID) 20 MG tablet Take 1 tablet (20 mg total) by mouth 2 (two) times daily. 04/02/17   [name], [name]  fluticasone (FLONASE) 50 MCG/ACT nasal spray Place 2 sprays into both nostrils  daily. 03/08/17   Couture, Cortni S, [name]  guaiFENesin-dextromethorphan (ROBITUSSIN DM) 100-10 MG/5ML syrup Take 5 mLs by mouth every 4 (four) hours as needed for cough. 05/18/17   [name], [name]  ibuprofen (ADVIL,MOTRIN) 400 MG tablet Take 1 tablet (400 mg total) by mouth every 6 (six) hours as needed. 07/13/17   Couture, Cortni S, [name]  Lido-Capsaicin-Men-Methyl Sal 0.5-0.035-5-20 % PTCH Apply 1 patch topically daily. 02/09/17   [name], [name], [name]  loratadine (CLARITIN) 10 MG tablet Take 1 tablet (10 mg total) by mouth daily. 03/08/17   Couture, Cortni S, [name]  methocarbamol (ROBAXIN) 750 MG tablet Take 1-2 tablets (750-1,500 mg total) by mouth 3 (three) times daily as needed for muscle spasms. 09/13/17   Cristina Gong, [name]  naproxen (NAPROSYN) 500 MG tablet Take 1 tablet (500 mg total) by mouth 2 (two) times daily. 01/13/18   McDonald, Mia A, [name]  sodium chloride (OCEAN) 0.65 % SOLN nasal spray Place 1 spray into both nostrils as needed for congestion. 05/18/17   [name], [name]  sucralfate (CARAFATE) 1 g tablet Take 1 tablet (1 g total) by mouth 4 (four) times daily -  with meals and at bedtime. 04/02/17   [name], [name]    Family History History reviewed. No pertinent family history.  Social History Social History   Tobacco Use  . Smoking status: Current Every Day  Smoker    Packs/day: 1.00    Types: Cigarettes  . Smokeless tobacco: Never Used  Substance Use Topics  . Alcohol use: Yes    Alcohol/week: 5.0 standard drinks    Types: 5 Cans of beer per week    Comment: occ  . Drug use: No     Allergies   Patient has no known allergies.   Review of Systems Review of Systems  Constitutional: Negative for fever.  Musculoskeletal: Positive for arthralgias.  Neurological: Negative for numbness.     Physical Exam Updated Vital Signs BP (!) 144/98 (BP Location: Right Arm)   Pulse 77   Temp 97.9 F (36.6 C) (Oral)   Resp 17   Ht 5\' 5"  (1.651 m)    Wt 74.4 kg   SpO2 100%   BMI 27.29 kg/m   Physical Exam Vitals signs and nursing note reviewed.  Constitutional:      General: He is not in acute distress.    Appearance: He is well-developed. He is not diaphoretic.  HENT:     Head: Normocephalic and atraumatic.     Mouth/Throat:     Pharynx: No oropharyngeal exudate.  Eyes:     General: No scleral icterus.       Right eye: No discharge.        Left eye: No discharge.     Conjunctiva/sclera: Conjunctivae normal.     Pupils: Pupils are equal, round, and reactive to light.  Neck:     Musculoskeletal: Normal range of motion and neck supple.     Thyroid: No thyromegaly.  Cardiovascular:     Rate and Rhythm: Normal rate and regular rhythm.     Heart sounds: Normal heart sounds. No murmur. No friction rub. No gallop.   Pulmonary:     Effort: Pulmonary effort is normal. No respiratory distress.     Breath sounds: Normal breath sounds. No stridor. No wheezing or rales.  Abdominal:     General: Bowel sounds are normal. There is no distension.     Palpations: Abdomen is soft.     Tenderness: There is no abdominal tenderness. There is no guarding or rebound.  Musculoskeletal:     Comments: Mild tenderness over the right upper trapezius 5/5 strength of bilateral upper extremities with equal lateral grip strength, normal sensation  Lymphadenopathy:     Cervical: No cervical adenopathy.  Skin:    General: Skin is warm and dry.     Coloration: Skin is not pale.     Findings: No rash.  Neurological:     Mental Status: He is alert.     Coordination: Coordination normal.      ED Treatments / Results  Labs (all labs ordered are listed, but only abnormal results are displayed) Labs Reviewed - No data to display  EKG None  Radiology No results found.  Procedures Procedures (including critical care time)  Medications Ordered in ED Medications - No data to display   Initial Impression / Assessment and Plan / ED Course    I have reviewed the triage vital signs and the nursing notes.  Pertinent labs & imaging results that were available during my care of the patient were reviewed by me and considered in my medical decision making (see chart for details).     Patient was given refill of Flexeril 10 mg.  He is advised to follow-up and establish care with a PCP for further refills.  There are no changes in his chronic problem.  He is neurovascularly intact.  Patient is well-appearing and I feel is safe for discharge.  Final Clinical Impressions(s) / ED Diagnoses   Final diagnoses:  Medication refill  Chronic right shoulder pain    ED Discharge Orders         Ordered    cyclobenzaprine (FLEXERIL) 10 MG tablet  2 times daily PRN     01/18/18 1841           Emi Holes, [name] 01/18/18 1842    Mancel Bale, MD 01/19/18 [phone]

## 2018-01-18 NOTE — ED Triage Notes (Signed)
Pt reports being seen a couple of days ago for R shoulder pain. Pt reports he told MD he was out of a wrong prescription. Pt reports he is here for a prescription for flexeril.

## 2018-02-17 ENCOUNTER — Encounter (HOSPITAL_COMMUNITY): Payer: Self-pay | Admitting: Student

## 2018-02-17 ENCOUNTER — Emergency Department (HOSPITAL_COMMUNITY): Payer: Self-pay

## 2018-02-17 ENCOUNTER — Emergency Department (HOSPITAL_COMMUNITY)
Admission: EM | Admit: 2018-02-17 | Discharge: 2018-02-17 | Disposition: A | Discharge status: Home / Self Care | Payer: Self-pay | Attending: Emergency Medicine | Admitting: Emergency Medicine

## 2018-02-17 DIAGNOSIS — R05 Cough: Secondary | ICD-10-CM | POA: Insufficient documentation

## 2018-02-17 DIAGNOSIS — J111 Influenza due to unidentified influenza virus with other respiratory manifestations: Secondary | ICD-10-CM | POA: Insufficient documentation

## 2018-02-17 DIAGNOSIS — Z79899 Other long term (current) drug therapy: Secondary | ICD-10-CM | POA: Insufficient documentation

## 2018-02-17 DIAGNOSIS — R69 Illness, unspecified: Secondary | ICD-10-CM

## 2018-02-17 DIAGNOSIS — F17210 Nicotine dependence, cigarettes, uncomplicated: Secondary | ICD-10-CM | POA: Insufficient documentation

## 2018-02-17 LAB — INFLUENZA PANEL BY PCR (TYPE A & B)
INFLAPCR: POSITIVE — AB
INFLBPCR: NEGATIVE

## 2018-02-17 MED ORDER — NAPROXEN 500 MG PO TABS: 500.0000 mg | ORAL_TABLET | Freq: Two times a day (BID) | ORAL | 0 refills | Status: DC

## 2018-02-17 MED ORDER — ACETAMINOPHEN 500 MG PO TABS
500.0000 mg | ORAL_TABLET | Freq: Once | ORAL | Status: AC
  Administered 2018-02-17: 500 mg via ORAL
  Filled 2018-02-17: qty 1

## 2018-02-17 MED ORDER — BENZONATATE 100 MG PO CAPS: 100.0000 mg | ORAL_CAPSULE | Freq: Three times a day (TID) | ORAL | 0 refills | Status: DC | PRN

## 2018-02-17 MED ORDER — FLUTICASONE PROPIONATE 50 MCG/ACT NA SUSP: 1.0000 | Freq: Every day | NASAL | 0 refills | Status: DC

## 2018-02-17 MED ORDER — OSELTAMIVIR PHOSPHATE 75 MG PO CAPS: 75.0000 mg | ORAL_CAPSULE | Freq: Two times a day (BID) | ORAL | 0 refills | Status: DC

## 2018-02-17 NOTE — Discharge Instructions (Addendum)
You were seen in the emergency today for upper respiratory symptoms. Your chest xray was normal. We suspect you have the flu- if positive we will call you  -Flonase to be used 1 spray in each nostril daily.  This medication is used to treat your congestion.  -Tessalon can be taken once every 8 hours as needed.  This medication is used to treat your cough.  - Naproxen is a nonsteroidal anti-inflammatory medication that will help with pain and swelling. Be sure to take this medication as prescribed with food, 1 pill every 12 hours,  It should be taken with food, as it can cause stomach upset, and more seriously, stomach bleeding. Do not take other nonsteroidal anti-inflammatory medications with this such as Advil, Motrin, Aleve, Mobic, Goodie Powder, or Motrin.    - Tamiflu- medicine to treat flu, take twice per day for 5 days. We will call you in flu positive- if positive can take, if not do not fill prescription.    You make take Tylenol per over the counter dosing with these medications.   We have prescribed you new medication(s) today. Discuss the medications prescribed today with your pharmacist as they can have adverse effects and interactions with your other medicines including over the counter and prescribed medications. Seek medical evaluation if you start to experience new or abnormal symptoms after taking one of these medicines, seek care immediately if you start to experience difficulty breathing, feeling of your throat closing, facial swelling, or rash as these could be indications of a more serious allergic reaction   You will need to follow-up with your primary care provider in 1 week if your symptoms have not improved.  If you do not have a primary care provider one is provided in your discharge instructions.  Return to the emergency department for any new or worsening symptoms including but not limited to persistent fever for 5 days, difficulty breathing, chest pain, rashes, passing  out, or any other concerns.

## 2018-02-17 NOTE — ED Notes (Signed)
Called pt x3, no answer.  

## 2018-02-17 NOTE — ED Triage Notes (Signed)
Pt here with c/o cough and fever and gen body aches

## 2018-02-17 NOTE — ED Provider Notes (Signed)
[facility]   CSN: [id number] Arrival date & time: 02/17/18  1730     History   Chief Complaint Flu like symptoms  HPI Phillip Mcconnell is a 38 y.o. male with a history of tobacco abuse and chronic back pain who presents to the emergency department with flulike symptoms over the past 48 hours.  Patient reports congestion, bilateral ear pain, cough productive of mucus sputum, generalized body aches, subjective fever, and chills.  Symptoms are constant.  He states that he tried taking TheraFlu without relief.  No other specific alleviating or aggravating factors.  Denies sore throat, vomiting, abdominal pain, diarrhea, chest pain, dyspnea, or dysuria.  HPI  Past Medical History:  Diagnosis Date  . Chronic back pain     There are no active problems to display for this patient.   No past surgical history on file.      Home Medications    Prior to Admission medications   Medication Sig Start Date End Date Taking? Authorizing Provider  acetaminophen (TYLENOL) 325 MG tablet Take 2 tablets (650 mg total) by mouth every 6 (six) hours as needed. 03/08/17   Couture, Cortni S, [name]  cyclobenzaprine (FLEXERIL) 10 MG tablet Take 1 tablet (10 mg total) by mouth 2 (two) times daily as needed for muscle spasms. 01/18/18   Law, Waylan BogaAlexandra M, [name]  diphenhydramine-acetaminophen (TYLENOL PM) 25-500 MG TABS Take 2 tablets by mouth at bedtime as needed (for sleep).    [provider]  famotidine (PEPCID) 20 MG tablet Take 1 tablet (20 mg total) by mouth 2 (two) times daily. 04/02/17   [name], [name], [name]  fluticasone (FLONASE) 50 MCG/ACT nasal spray Place 2 sprays into both nostrils daily. 03/08/17   Couture, Cortni S, [name]  guaiFENesin-dextromethorphan (ROBITUSSIN DM) 100-10 MG/5ML syrup Take 5 mLs by mouth every 4 (four) hours as needed for cough. 05/18/17   Georgiana ShoreMitchell, Jessica B, [name]  ibuprofen (ADVIL,MOTRIN) 400 MG tablet Take 1 tablet  (400 mg total) by mouth every 6 (six) hours as needed. 07/13/17   Couture, Cortni S, [name]  Lido-Capsaicin-Men-Methyl Sal 0.5-0.035-5-20 % PTCH Apply 1 patch topically daily. 02/09/17   [name], Elmer SowMichael M, [name]  loratadine (CLARITIN) 10 MG tablet Take 1 tablet (10 mg total) by mouth daily. 03/08/17   Couture, Cortni S, [name]  methocarbamol (ROBAXIN) 750 MG tablet Take 1-2 tablets (750-1,500 mg total) by mouth 3 (three) times daily as needed for muscle spasms. 09/13/17   Cristina GongHammond, Elizabeth W, [name]  naproxen (NAPROSYN) 500 MG tablet Take 1 tablet (500 mg total) by mouth 2 (two) times daily. 01/13/18   McDonald, Mia A, [name]  sodium chloride (OCEAN) 0.65 % SOLN nasal spray Place 1 spray into both nostrils as needed for congestion. 05/18/17   Mathews RobinsonsMitchell, Jessica B, [name]  sucralfate (CARAFATE) 1 g tablet Take 1 tablet (1 g total) by mouth 4 (four) times daily -  with meals and at bedtime. 04/02/17   [name], [name], [name]    Family History No family history on file.  Social History Social History   Tobacco Use  . Smoking status: Current Every Day Smoker    Packs/day: 1.00    Types: Cigarettes  . Smokeless tobacco: Never Used  Substance Use Topics  . Alcohol use: Yes    Alcohol/week: 5.0 standard drinks    Types: 5 Cans of beer per week    Comment: occ  . Drug use: No     Allergies  Patient has no known allergies.   Review of Systems Review of Systems  Constitutional: Positive for chills and fever.  HENT: Positive for congestion and ear pain. Negative for sore throat.   Respiratory: Positive for cough. Negative for shortness of breath.   Cardiovascular: Negative for chest pain.  Gastrointestinal: Negative for abdominal pain, constipation, diarrhea, nausea and rectal pain.  Genitourinary: Negative for dysuria.  Musculoskeletal: Positive for myalgias.   Physical Exam Updated Vital Signs BP (!) 150/101 (BP Location: Right Arm)   Pulse 82   Temp (!) 101.5 F (38.6 C) (Oral)   Resp (!) 21    SpO2 100%   Physical Exam Vitals signs and nursing note reviewed.  Constitutional:      General: He is not in acute distress.    Appearance: He is well-developed. He is not toxic-appearing.  HENT:     Head: Normocephalic and atraumatic.     Right Ear: Tympanic membrane, ear canal and external ear normal. Tympanic membrane is not perforated, erythematous, retracted or bulging.     Left Ear: Tympanic membrane, ear canal and external ear normal. Tympanic membrane is not perforated, erythematous, retracted or bulging.     Nose: Mucosal edema and congestion present.     Right Sinus: No maxillary sinus tenderness or frontal sinus tenderness.     Left Sinus: No maxillary sinus tenderness or frontal sinus tenderness.     Mouth/Throat:     Pharynx: Uvula midline. No oropharyngeal exudate or posterior oropharyngeal erythema.     Comments: Posterior oropharynx is symmetric appearing. Patient tolerating own secretions without difficulty. No trismus. No drooling. No hot potato voice. No swelling beneath the tongue, submandibular compartment is soft. Eyes:     General:        Right eye: No discharge.        Left eye: No discharge.     Conjunctiva/sclera: Conjunctivae normal.  Neck:     Musculoskeletal: Normal range of motion and neck supple. No edema, neck rigidity or crepitus.  Cardiovascular:     Rate and Rhythm: Normal rate and regular rhythm.  Pulmonary:     Effort: Pulmonary effort is normal. No respiratory distress.     Breath sounds: Normal breath sounds. No wheezing, rhonchi or rales.  Abdominal:     General: There is no distension.     Palpations: Abdomen is soft.     Tenderness: There is no abdominal tenderness.  Lymphadenopathy:     Cervical: No cervical adenopathy.  Skin:    General: Skin is warm and dry.     Findings: No rash.  Neurological:     Mental Status: He is alert.     Comments: Clear speech.   Psychiatric:        Behavior: Behavior normal.      ED Treatments /  Results  Labs (all labs ordered are listed, but only abnormal results are displayed) Labs Reviewed  INFLUENZA PANEL BY PCR (TYPE A & B)    EKG None  Radiology Dg Chest 2 View  Result Date: 02/17/2018 CLINICAL DATA:  Pt here with c/o cough and fever and gen body aches x 3 days, smoker. EXAM: CHEST - 2 VIEW COMPARISON:  None. FINDINGS: Midline trachea.  Normal heart size and mediastinal contours. Sharp costophrenic angles.  No pneumothorax.  Clear lungs. IMPRESSION: Normal chest. Electronically Signed   By: Jeronimo GreavesKyle  Talbot M.D.   On: 02/17/2018 19:10    Procedures Procedures (including critical care time)  Medications Ordered in ED  Medications  acetaminophen (TYLENOL) tablet 500 mg (has no administration in time range)     Initial Impression / Assessment and Plan / ED Course  I have reviewed the triage vital signs and the nursing notes.  Pertinent labs & imaging results that were available during my care of the patient were reviewed by me and considered in my medical decision making (see chart for details).   Patient presents to the ED w/ flu like sxs. Nontoxic appearing. Febrile upon arrival. BP elevated- doubt HTN emergency. Lungs CTA, CXR per triage without infiltrate- doubt pneumonia. No wheezing or evidence of respiratory distress. No sinus tenderness, sxs < 7 days, doubt bacterial sinusitis. Centor 1 for fever, no sore throat, doubt strep. No evidence of AOM on exam. No meningismus. Influenza test by nursing staff pending at time of discharge. Discussed risks/benefits of tamiflu- patient would prefer to have this prescription- will provide this and call him with flu results. Additional supportive care provided including flonase, tessalon, and naproxen. Fever improved following anti-pyretics. I discussed results, treatment plan, need for follow-up, and return precautions with the patient. Provided opportunity for questions, patient confirmed understanding and is in agreement with plan.     Vitals:   02/17/18 1816 02/17/18 2231  BP: (!) 150/101 (!) 134/96  Pulse: 82 78  Resp: (!) 21 16  Temp: (!) 101.5 F (38.6 C) 98.9 F (37.2 C)  SpO2: 100% 98%    Final Clinical Impressions(s) / ED Diagnoses   Final diagnoses:  Influenza-like illness    ED Discharge Orders         Ordered    oseltamivir (TAMIFLU) 75 MG capsule  Every 12 hours     02/17/18 2228    fluticasone (FLONASE) 50 MCG/ACT nasal spray  Daily     02/17/18 2228    naproxen (NAPROSYN) 500 MG tablet  2 times daily     02/17/18 2228    benzonatate (TESSALON) 100 MG capsule  3 times daily PRN     02/17/18 2228           Jenness Stemler, Pleas Koch, [name] 02/17/18 2235    Linwood Dibbles, MD 02/18/18 0002

## 2018-03-10 ENCOUNTER — Emergency Department (HOSPITAL_COMMUNITY)
Admission: EM | Admit: 2018-03-10 | Discharge: 2018-03-10 | Disposition: A | Discharge status: Home / Self Care | Payer: Medicaid Other | Attending: Emergency Medicine | Admitting: Emergency Medicine

## 2018-03-10 ENCOUNTER — Encounter (HOSPITAL_COMMUNITY): Payer: Self-pay | Admitting: Emergency Medicine

## 2018-03-10 DIAGNOSIS — L249 Irritant contact dermatitis, unspecified cause: Secondary | ICD-10-CM

## 2018-03-10 DIAGNOSIS — L03811 Cellulitis of head [any part, except face]: Secondary | ICD-10-CM

## 2018-03-10 DIAGNOSIS — L309 Dermatitis, unspecified: Secondary | ICD-10-CM | POA: Insufficient documentation

## 2018-03-10 DIAGNOSIS — L299 Pruritus, unspecified: Secondary | ICD-10-CM | POA: Insufficient documentation

## 2018-03-10 DIAGNOSIS — F17210 Nicotine dependence, cigarettes, uncomplicated: Secondary | ICD-10-CM | POA: Insufficient documentation

## 2018-03-10 DIAGNOSIS — Z79899 Other long term (current) drug therapy: Secondary | ICD-10-CM | POA: Insufficient documentation

## 2018-03-10 MED ORDER — TRIAMCINOLONE ACETONIDE 0.1 % EX CREA: 1.0000 "application " | TOPICAL_CREAM | Freq: Two times a day (BID) | CUTANEOUS | 0 refills | Status: DC

## 2018-03-10 MED ORDER — CEPHALEXIN 500 MG PO CAPS: 500.0000 mg | ORAL_CAPSULE | Freq: Four times a day (QID) | ORAL | 0 refills | Status: DC

## 2018-03-10 NOTE — ED Provider Notes (Signed)
[facility] EMERGENCY DEPARTMENT Provider Note   CSN: [id number] Arrival date & time: 03/10/18  8366     History   Chief Complaint Chief Complaint  Patient presents with  . Dermatitis    HPI Phillip Mcconnell is a 38 y.o. male.  Patient reports bilateral ear "swelling".  Reports that this started about 2 or 3 weeks ago.  Reports that it started after he got a new earbuds for his ears that were made out of metal.  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  Reports after wearing the ear buds for a week his ears became very itchy and irritated.  He stopped using the ear buds and was told by someone to put Neosporin on his ears to help with the irritation.  He has been putting Neosporin on his ears multiple times a day for the last couple of weeks.  Reports that his skin is swelling and he is having drainage from his ears.  Denies any fever, chills, nausea, vomiting or other URI symptoms.     Past Medical History:  Diagnosis Date  . Chronic back pain     There are no active problems to display for this patient.   History reviewed. No pertinent surgical history.      Home Medications    Prior to Admission medications   Medication Sig Start Date End Date Taking? Authorizing Provider  acetaminophen (TYLENOL) 325 MG tablet Take 2 tablets (650 mg total) by mouth every 6 (six) hours as needed. 03/08/17   Couture, Cortni S, [name]  benzonatate (TESSALON) 100 MG capsule Take 1 capsule (100 mg total) by mouth 3 (three) times daily as needed for cough. 02/17/18   [name], [name], [name]  cephALEXin (KEFLEX) 500 MG capsule Take 1 capsule (500 mg total) by mouth 4 (four) times daily. 03/10/18   Arlyn Dunning, [name]  cyclobenzaprine (FLEXERIL) 10 MG tablet Take 1 tablet (10 mg total) by mouth 2 (two) times daily as needed for muscle spasms. 01/18/18   Law, Waylan Boga, [name]  diphenhydramine-acetaminophen (TYLENOL PM) 25-500 MG TABS Take 2 tablets by mouth at bedtime as  needed (for sleep).    [provider]  famotidine (PEPCID) 20 MG tablet Take 1 tablet (20 mg total) by mouth 2 (two) times daily. 04/02/17   [name], [name]  fluticasone (FLONASE) 50 MCG/ACT nasal spray Place 1 spray into both nostrils daily. 02/17/18   [name], [name], [name]  guaiFENesin-dextromethorphan (ROBITUSSIN DM) 100-10 MG/5ML syrup Take 5 mLs by mouth every 4 (four) hours as needed for cough. 05/18/17   [name], [name]  ibuprofen (ADVIL,MOTRIN) 400 MG tablet Take 1 tablet (400 mg total) by mouth every 6 (six) hours as needed. 07/13/17   Couture, Cortni S, [name]  Lido-Capsaicin-Men-Methyl Sal 0.5-0.035-5-20 % PTCH Apply 1 patch topically daily. 02/09/17   [name], [name], [name]  loratadine (CLARITIN) 10 MG tablet Take 1 tablet (10 mg total) by mouth daily. 03/08/17   Couture, Cortni S, [name]  methocarbamol (ROBAXIN) 750 MG tablet Take 1-2 tablets (750-1,500 mg total) by mouth 3 (three) times daily as needed for muscle spasms. 09/13/17   Cristina Gong, [name]  naproxen (NAPROSYN) 500 MG tablet Take 1 tablet (500 mg total) by mouth 2 (two) times daily. 02/17/18   [name], Pleas Koch, [name]  oseltamivir (TAMIFLU) 75 MG capsule Take 1 capsule (75 mg total) by mouth every 12 (twelve) hours. 02/17/18   [name], Pleas Koch, [name]  sodium chloride (OCEAN) 0.65 % SOLN nasal spray Place 1 spray into both nostrils as needed for congestion. 05/18/17   [name], [name]  sucralfate (CARAFATE) 1 g tablet Take 1 tablet (1 g total) by mouth 4 (four) times daily -  with meals and at bedtime. 04/02/17   [name], [name]  triamcinolone cream (KENALOG) 0.1 % Apply 1 application topically 2 (two) times daily. 03/10/18   [name]    Family History History reviewed. No pertinent family history.  Social History Social History   Tobacco Use  . Smoking status: Current Every Day Smoker    Packs/day: 1.00    Types: Cigarettes  . Smokeless tobacco: Never Used   Substance Use Topics  . Alcohol use: Yes    Alcohol/week: 5.0 standard drinks    Types: 5 Cans of beer per week    Comment: occ  . Drug use: No     Allergies   Patient has no known allergies.   Review of Systems Review of Systems  HENT: Positive for ear discharge and ear pain. Negative for congestion, dental problem, drooling, facial swelling, hearing loss, nosebleeds, postnasal drip, rhinorrhea, sinus pain, sneezing and sore throat.   Eyes: Negative for redness and itching.  Respiratory: Negative for cough and shortness of breath.   Cardiovascular: Negative for chest pain.  Gastrointestinal: Negative for nausea and vomiting.  Musculoskeletal: Negative for arthralgias and myalgias.  Skin: Positive for rash. Negative for color change, pallor and wound.  Neurological: Negative for dizziness, light-headedness and headaches.     Physical Exam Updated Vital Signs BP (!) 153/109 (BP Location: Right Arm)   Pulse 72   Temp 98.3 F (36.8 C) (Oral)   Resp 16   SpO2 96%   Physical Exam Vitals signs and nursing note reviewed.  Constitutional:      General: He is not in acute distress.    Appearance: Normal appearance. He is not ill-appearing, toxic-appearing or diaphoretic.  HENT:     Head: Normocephalic and atraumatic.     Right Ear: Tympanic membrane normal.     Left Ear: Tympanic membrane normal.     Ears:     Comments: Bilateral TMs are normal.  The entire auricle on both sides is scaling has some mild swelling.  No significant erythema.  There is clear/yellow mild drainage from bilateral ear canals.  The ear canals themselves appear not swollen or erythematous.    Mouth/Throat:     Mouth: Mucous membranes are moist.  Eyes:     Conjunctiva/sclera: Conjunctivae normal.     Pupils: Pupils are equal, round, and reactive to light.  Cardiovascular:     Rate and Rhythm: Normal rate and regular rhythm.  Pulmonary:     Effort: Pulmonary effort is normal.  Skin:    General:  Skin is dry.  Neurological:     Mental Status: He is alert.  Psychiatric:        Mood and Affect: Mood normal.      ED Treatments / Results  Labs (all labs ordered are listed, but only abnormal results are displayed) Labs Reviewed - No data to display  EKG None  Radiology No results found.  Procedures Procedures (including critical care time)  Medications Ordered in ED Medications - No data to display   Initial Impression / Assessment and Plan / ED Course  I have reviewed the triage vital signs and the nursing notes.  Pertinent labs & imaging results that were available  during my care of the patient were reviewed by me and considered in my medical decision making (see chart for details).  Clinical Course as of Mar 10 922  Fri Mar 10, 2018  0917 I believe that this probably initially started with a contact dermatitis from the ear buds which was complicated by using the Neosporin which he is probably also allergic to.  I cannot rule out any secondary skin infection of the bilateral auricles.  I am going to treat with topical steroid cream and Keflex.  He is advised to stop using Neosporin and stop using his earplugs and to return if things do not get better or if they worsen.   [KM]    Clinical Course User Index [KM] Arlyn DunningMcLean, Phyllicia Dudek A, [name]    Based on review of vitals, medical screening exam, lab work and/or imaging, there does not appear to be an acute, emergent etiology for the patient's symptoms. Counseled pt on good return precautions and encouraged both PCP and ED follow-up as needed.  Prior to discharge, I also discussed incidental imaging findings with patient in detail and advised appropriate, recommended follow-up in detail.  Clinical Impression: 1. Dermatitis   2. Irritant contact dermatitis, unspecified trigger   3. Cellulitis of head except face     Disposition: Discharge   This note was prepared with assistance of Dragon voice recognition software.  Occasional wrong-word or sound-a-like substitutions may have occurred due to the inherent limitations of voice recognition software.   Final Clinical Impressions(s) / ED Diagnoses   Final diagnoses:  Dermatitis  Irritant contact dermatitis, unspecified trigger  Cellulitis of head except face    ED Discharge Orders         Ordered    cephALEXin (KEFLEX) 500 MG capsule  4 times daily     03/10/18 0912    triamcinolone cream (KENALOG) 0.1 %  2 times daily     03/10/18 0912           Arlyn DunningMcLean, Thoams Siefert A, [name] 03/10/18 [id number]    Arby BarrettePfeiffer, Marcy, MD 03/13/18 1043

## 2018-03-10 NOTE — ED Triage Notes (Signed)
Pt bought ear buds off [employer] and both ears "broke out". States skin peeling and having drainage from ears. Been taking benadryl- last dose last night. Been using neosporin as well.

## 2018-03-10 NOTE — Discharge Instructions (Signed)
Stop using your new airbuds and stop using neosporin. Thank you for allowing me to care for you today. Please return to the emergency department if you have new or worsening symptoms. Take your medications as instructed.

## 2018-03-26 ENCOUNTER — Other Ambulatory Visit: Payer: Self-pay

## 2018-03-26 ENCOUNTER — Emergency Department (HOSPITAL_COMMUNITY)
Admission: EM | Admit: 2018-03-26 | Discharge: 2018-03-26 | Disposition: A | Discharge status: Home / Self Care | Payer: Self-pay | Attending: Emergency Medicine | Admitting: Emergency Medicine

## 2018-03-26 ENCOUNTER — Encounter (HOSPITAL_COMMUNITY): Payer: Self-pay

## 2018-03-26 DIAGNOSIS — Y990 Civilian activity done for income or pay: Secondary | ICD-10-CM | POA: Insufficient documentation

## 2018-03-26 DIAGNOSIS — S46912A Strain of unspecified muscle, fascia and tendon at shoulder and upper arm level, left arm, initial encounter: Secondary | ICD-10-CM | POA: Insufficient documentation

## 2018-03-26 DIAGNOSIS — X509XXA Other and unspecified overexertion or strenuous movements or postures, initial encounter: Secondary | ICD-10-CM | POA: Insufficient documentation

## 2018-03-26 DIAGNOSIS — Y9289 Other specified places as the place of occurrence of the external cause: Secondary | ICD-10-CM | POA: Insufficient documentation

## 2018-03-26 DIAGNOSIS — Y9389 Activity, other specified: Secondary | ICD-10-CM | POA: Insufficient documentation

## 2018-03-26 DIAGNOSIS — R21 Rash and other nonspecific skin eruption: Secondary | ICD-10-CM | POA: Insufficient documentation

## 2018-03-26 DIAGNOSIS — Z79899 Other long term (current) drug therapy: Secondary | ICD-10-CM | POA: Insufficient documentation

## 2018-03-26 DIAGNOSIS — F17210 Nicotine dependence, cigarettes, uncomplicated: Secondary | ICD-10-CM | POA: Insufficient documentation

## 2018-03-26 DIAGNOSIS — L309 Dermatitis, unspecified: Secondary | ICD-10-CM | POA: Insufficient documentation

## 2018-03-26 MED ORDER — IBUPROFEN 800 MG PO TABS: 800.0000 mg | ORAL_TABLET | Freq: Three times a day (TID) | ORAL | 0 refills | Status: DC

## 2018-03-26 MED ORDER — CLOTRIMAZOLE 1 % EX CREA: TOPICAL_CREAM | CUTANEOUS | 0 refills | Status: DC

## 2018-03-26 MED ORDER — METHOCARBAMOL 500 MG PO TABS: 1000.0000 mg | ORAL_TABLET | Freq: Three times a day (TID) | ORAL | 0 refills | Status: DC | PRN

## 2018-03-26 NOTE — ED Notes (Signed)
Patient verbalized understanding of discharge instructions and prescription medications and denies any further needs or questions at this time. VS stable. Patient ambulatory with steady gait.  

## 2018-03-26 NOTE — ED Triage Notes (Signed)
Pt seen for ear pain several weeks ago.  Painful when laying on right ear, intermittant drainage, reports unable to stick qtip in ear d/t swelling.  C/o left shoulder pain radiating to back, onset 2 days ago.  Thinks may have injured shoulder at work using a machine.

## 2018-03-26 NOTE — Discharge Instructions (Signed)
1.  Apply Chlortrimazole ointment to your right ear twice a day.  This will treat for a fungal infection.  If your rash significantly improves then this is likely a fungal infection you should take care to make sure the ear stays clean and dry.  If it gets worse you need a recheck. 2.  Take ibuprofen and Robaxin for muscle strain. 3.  Schedule follow-up with a family doctor.  If you do not have a family doctor use referral number in your discharge instructions to find one.

## 2018-03-26 NOTE — ED Provider Notes (Signed)
[facility] EMERGENCY DEPARTMENT Provider Note   CSN: [id number] Arrival date & time: 03/26/18  1401    History   Chief Complaint Chief Complaint  Patient presents with  . Shoulder Pain  . Otalgia    HPI Phillip Mcconnell is a 38 y.o. male.     HPI Patient has 2 chief complaints. 1.  Shoulder pain: Patient reports he has pain in his left shoulder and particularly on the medial aspect of the scapula.  He reports he uses a jacket at work and there is pretty heavy impact.  He reports he twisted it a few days ago and it seems a lot worse.  No numbness no tingling. 2.  Ear rash: Patient reports he had a rash in his right ear before that was treated with a cream and antibiotic.  He reports it got better for a while but then it came back again and now it is getting uncomfortable and itchy and swollen.  He initially was using some earbuds that were thought to have precipitated contact dermatitis.  He reports he is not putting anything in his ears anymore. Past Medical History:  Diagnosis Date  . Chronic back pain     There are no active problems to display for this patient.   History reviewed. No pertinent surgical history.      Home Medications    Prior to Admission medications   Medication Sig Start Date End Date Taking? Authorizing Provider  acetaminophen (TYLENOL) 325 MG tablet Take 2 tablets (650 mg total) by mouth every 6 (six) hours as needed. 03/08/17   Couture, Cortni S, [name]  benzonatate (TESSALON) 100 MG capsule Take 1 capsule (100 mg total) by mouth 3 (three) times daily as needed for cough. 02/17/18   [name], [name], [name]  cephALEXin (KEFLEX) 500 MG capsule Take 1 capsule (500 mg total) by mouth 4 (four) times daily. 03/10/18   Arlyn Dunning, [name]  clotrimazole (LOTRIMIN) 1 % cream Apply to affected area 2 times daily right ear for 10 days 03/26/18   Arby Barrette, MD  cyclobenzaprine (FLEXERIL) 10 MG tablet Take 1 tablet (10 mg total) by  mouth 2 (two) times daily as needed for muscle spasms. 01/18/18   Law, Waylan Boga, [name]  diphenhydramine-acetaminophen (TYLENOL PM) 25-500 MG TABS Take 2 tablets by mouth at bedtime as needed (for sleep).    [provider]  famotidine (PEPCID) 20 MG tablet Take 1 tablet (20 mg total) by mouth 2 (two) times daily. 04/02/17   [name], [name]  fluticasone (FLONASE) 50 MCG/ACT nasal spray Place 1 spray into both nostrils daily. 02/17/18   [name], [name], [name]  guaiFENesin-dextromethorphan (ROBITUSSIN DM) 100-10 MG/5ML syrup Take 5 mLs by mouth every 4 (four) hours as needed for cough. 05/18/17   [name], [name]  ibuprofen (ADVIL,MOTRIN) 400 MG tablet Take 1 tablet (400 mg total) by mouth every 6 (six) hours as needed. 07/13/17   Couture, Cortni S, [name]  ibuprofen (ADVIL,MOTRIN) 800 MG tablet Take 1 tablet (800 mg total) by mouth 3 (three) times daily. 03/26/18   Arby Barrette, MD  Lido-Capsaicin-Men-Methyl Sal 0.5-0.035-5-20 % PTCH Apply 1 patch topically daily. 02/09/17   [name], [name], [name]  loratadine (CLARITIN) 10 MG tablet Take 1 tablet (10 mg total) by mouth daily. 03/08/17   Couture, Cortni S, [name]  methocarbamol (ROBAXIN) 500 MG tablet Take 2 tablets (1,000 mg total) by mouth every 8 (eight) hours as needed for muscle spasms.  03/26/18   Arby Barrette, MD  methocarbamol (ROBAXIN) 750 MG tablet Take 1-2 tablets (750-1,500 mg total) by mouth 3 (three) times daily as needed for muscle spasms. 09/13/17   Cristina Gong, [name]  naproxen (NAPROSYN) 500 MG tablet Take 1 tablet (500 mg total) by mouth 2 (two) times daily. 02/17/18   [name], Pleas Koch, [name]  oseltamivir (TAMIFLU) 75 MG capsule Take 1 capsule (75 mg total) by mouth every 12 (twelve) hours. 02/17/18   [name], [name], [name]  sodium chloride (OCEAN) 0.65 % SOLN nasal spray Place 1 spray into both nostrils as needed for congestion. 05/18/17   [name], [name]  sucralfate (CARAFATE) 1 g tablet  Take 1 tablet (1 g total) by mouth 4 (four) times daily -  with meals and at bedtime. 04/02/17   [name], [name]  triamcinolone cream (KENALOG) 0.1 % Apply 1 application topically 2 (two) times daily. 03/10/18   [name]    Family History History reviewed. No pertinent family history.  Social History Social History   Tobacco Use  . Smoking status: Current Every Day Smoker    Packs/day: 1.00    Types: Cigarettes  . Smokeless tobacco: Never Used  Substance Use Topics  . Alcohol use: Yes    Alcohol/week: 5.0 standard drinks    Types: 5 Cans of beer per week    Comment: occ  . Drug use: No     Allergies   Patient has no known allergies.   Review of Systems Review of Systems Constitutional: No fever no chills Respiratory: No chest pain no shortness of breath  Physical Exam Updated Vital Signs BP (!) 125/92   Pulse 83   Temp 98.1 F (36.7 C) (Oral)   Resp 16   SpO2 100%   Physical Exam Constitutional:      Appearance: He is well-developed.  HENT:     Head: Normocephalic and atraumatic.     Ears:     Comments: Bilateral TMs normal.  See attached image for pinna with some    Mouth/Throat:     Mouth: Mucous membranes are moist.     Pharynx: Oropharynx is clear.  Eyes:     Pupils: Pupils are equal, round, and reactive to light.  Neck:     Musculoskeletal: Neck supple.  Cardiovascular:     Rate and Rhythm: Normal rate and regular rhythm.     Heart sounds: Normal heart sounds.  Pulmonary:     Effort: Pulmonary effort is normal.     Breath sounds: Normal breath sounds.  Abdominal:     General: Bowel sounds are normal. There is no distension.     Palpations: Abdomen is soft.     Tenderness: There is no abdominal tenderness.  Musculoskeletal: Normal range of motion.     Comments: Patient has normal range of motion.  He is able to raise his arms and take his hoodie on and off without difficulty.  He does have reproducible pain along the scapula medial  border on the left.  Neurovascularly intact.  Skin:    General: Skin is warm and dry.  Neurological:     Mental Status: He is alert and oriented to person, place, and time.     GCS: GCS eye subscore is 4. GCS verbal subscore is 5. GCS motor subscore is 6.     Coordination: Coordination normal.  Psychiatric:        Mood and Affect: Mood normal.  ED Treatments / Results  Labs (all labs ordered are listed, but only abnormal results are displayed) Labs Reviewed - No data to display  EKG None  Radiology No results found.  Procedures Procedures (including critical care time)  Medications Ordered in ED Medications - No data to display   Initial Impression / Assessment and Plan / ED Course  I have reviewed the triage vital signs and the nursing notes.  Pertinent labs & imaging results that were available during my care of the patient were reviewed by me and considered in my medical decision making (see chart for details).        Patient has mechanism of injury consistent with shoulder strain.  He is neurovascularly intact.  He has good range of motion.  Will treat with ibuprofen and Robaxin.  Patient has a recrudescent dermatitis of the left pinna.  He was treated with a topical steroid and Keflex.  He reports he got better but then came back when medication stopped.  I have some suspicion for fungal etiology.  Will trial a course of Chlortrimazole.  No erythema or signs of cellulitis.  Eardrum is normal.  Ear canal patent.  Return precautions reviewed.  Final Clinical Impressions(s) / ED Diagnoses   Final diagnoses:  Strain of left shoulder, initial encounter  Dermatitis    ED Discharge Orders         Ordered    ibuprofen (ADVIL,MOTRIN) 800 MG tablet  3 times daily     03/26/18 1546    methocarbamol (ROBAXIN) 500 MG tablet  Every 8 hours PRN     03/26/18 1546    clotrimazole (LOTRIMIN) 1 % cream     03/26/18 1546           Arby Barrette, MD 03/26/18  1600

## 2018-03-28 ENCOUNTER — Encounter (HOSPITAL_COMMUNITY): Payer: Self-pay | Admitting: Internal Medicine

## 2018-03-28 ENCOUNTER — Emergency Department (HOSPITAL_COMMUNITY)
Admission: EM | Admit: 2018-03-28 | Discharge: 2018-03-28 | Disposition: A | Discharge status: Home / Self Care | Payer: Self-pay | Attending: Emergency Medicine | Admitting: Emergency Medicine

## 2018-03-28 DIAGNOSIS — F17210 Nicotine dependence, cigarettes, uncomplicated: Secondary | ICD-10-CM | POA: Insufficient documentation

## 2018-03-28 DIAGNOSIS — Z79899 Other long term (current) drug therapy: Secondary | ICD-10-CM | POA: Insufficient documentation

## 2018-03-28 DIAGNOSIS — G8929 Other chronic pain: Secondary | ICD-10-CM

## 2018-03-28 DIAGNOSIS — M25512 Pain in left shoulder: Secondary | ICD-10-CM | POA: Insufficient documentation

## 2018-03-28 NOTE — Discharge Instructions (Addendum)
Please read attached information. If you experience any new or worsening signs or symptoms please return to the emergency room for evaluation. Please follow-up with your primary care provider or specialist as discussed.  °

## 2018-03-28 NOTE — ED Notes (Signed)
Patient verbalizes understanding of discharge instructions. Opportunity for questioning and answers were provided. Armband removed by staff, pt discharged from ED.  

## 2018-03-28 NOTE — ED Provider Notes (Signed)
[facility] EMERGENCY DEPARTMENT Provider Note   CSN: [id number] Arrival date & time: 03/28/18  4098   History   Chief Complaint Chief Complaint  Patient presents with  . Shoulder Pain    HPI Phillip Mcconnell is a 38 y.o. male.     HPI   38 year old male presents today with chronic left shoulder pain.  Patient notes he works at [employer] and does heavy lifting.  Patient notes this is similar to previous shoulder pain.  No fever.  No neurological deficits.  Past Medical History:  Diagnosis Date  . Chronic back pain     There are no active problems to display for this patient.   History reviewed. No pertinent surgical history.      Home Medications    Prior to Admission medications   Medication Sig Start Date End Date Taking? Authorizing Provider  acetaminophen (TYLENOL) 325 MG tablet Take 2 tablets (650 mg total) by mouth every 6 (six) hours as needed. 03/08/17   Couture, Cortni S, [name]  benzonatate (TESSALON) 100 MG capsule Take 1 capsule (100 mg total) by mouth 3 (three) times daily as needed for cough. 02/17/18   [name], [name], [name]  cephALEXin (KEFLEX) 500 MG capsule Take 1 capsule (500 mg total) by mouth 4 (four) times daily. 03/10/18   Arlyn Dunning, [name]  clotrimazole (LOTRIMIN) 1 % cream Apply to affected area 2 times daily right ear for 10 days 03/26/18   Arby Barrette, MD  cyclobenzaprine (FLEXERIL) 10 MG tablet Take 1 tablet (10 mg total) by mouth 2 (two) times daily as needed for muscle spasms. 01/18/18   Law, Waylan Boga, [name]  diphenhydramine-acetaminophen (TYLENOL PM) 25-500 MG TABS Take 2 tablets by mouth at bedtime as needed (for sleep).    [provider]  famotidine (PEPCID) 20 MG tablet Take 1 tablet (20 mg total) by mouth 2 (two) times daily. 04/02/17   [name], [name]  fluticasone (FLONASE) 50 MCG/ACT nasal spray Place 1 spray into both nostrils daily. 02/17/18   [name], [name], [name]    guaiFENesin-dextromethorphan (ROBITUSSIN DM) 100-10 MG/5ML syrup Take 5 mLs by mouth every 4 (four) hours as needed for cough. 05/18/17   [name], [name]  ibuprofen (ADVIL,MOTRIN) 400 MG tablet Take 1 tablet (400 mg total) by mouth every 6 (six) hours as needed. 07/13/17   Couture, Cortni S, [name]  ibuprofen (ADVIL,MOTRIN) 800 MG tablet Take 1 tablet (800 mg total) by mouth 3 (three) times daily. 03/26/18   Arby Barrette, MD  Lido-Capsaicin-Men-Methyl Sal 0.5-0.035-5-20 % PTCH Apply 1 patch topically daily. 02/09/17   [name], [name], [name]  loratadine (CLARITIN) 10 MG tablet Take 1 tablet (10 mg total) by mouth daily. 03/08/17   Couture, Cortni S, [name]  methocarbamol (ROBAXIN) 500 MG tablet Take 2 tablets (1,000 mg total) by mouth every 8 (eight) hours as needed for muscle spasms. 03/26/18   Arby Barrette, MD  methocarbamol (ROBAXIN) 750 MG tablet Take 1-2 tablets (750-1,500 mg total) by mouth 3 (three) times daily as needed for muscle spasms. 09/13/17   Cristina Gong, [name]  naproxen (NAPROSYN) 500 MG tablet Take 1 tablet (500 mg total) by mouth 2 (two) times daily. 02/17/18   [name], Pleas Koch, [name]  oseltamivir (TAMIFLU) 75 MG capsule Take 1 capsule (75 mg total) by mouth every 12 (twelve) hours. 02/17/18   [name], [name], [name]  sodium chloride (OCEAN) 0.65 % SOLN nasal spray Place 1 spray into both  nostrils as needed for congestion. 05/18/17   [name], [name]  sucralfate (CARAFATE) 1 g tablet Take 1 tablet (1 g total) by mouth 4 (four) times daily -  with meals and at bedtime. 04/02/17   [name], [name]  triamcinolone cream (KENALOG) 0.1 % Apply 1 application topically 2 (two) times daily. 03/10/18   Arlyn Dunning, [name]    Family History No family history on file.  Social History Social History   Tobacco Use  . Smoking status: Current Every Day Smoker    Packs/day: 1.00    Types: Cigarettes  . Smokeless tobacco: Never Used  Substance Use Topics  .  Alcohol use: Yes    Alcohol/week: 5.0 standard drinks    Types: 5 Cans of beer per week    Comment: occ  . Drug use: No     Allergies   Patient has no known allergies.   Review of Systems Review of Systems  All other systems reviewed and are negative.    Physical Exam Updated Vital Signs BP (!) 146/101 (BP Location: Right Arm)   Pulse 78   Temp 98.3 F (36.8 C) (Oral)   Resp 16   SpO2 96%   Physical Exam Vitals signs and nursing note reviewed.  Constitutional:      Appearance: He is well-developed.  HENT:     Head: Normocephalic and atraumatic.  Eyes:     General: No scleral icterus.       Right eye: No discharge.        Left eye: No discharge.     Conjunctiva/sclera: Conjunctivae normal.     Pupils: Pupils are equal, round, and reactive to light.  Neck:     Musculoskeletal: Normal range of motion.     Vascular: No JVD.     Trachea: No tracheal deviation.  Pulmonary:     Effort: Pulmonary effort is normal.     Breath sounds: No stridor.  Musculoskeletal:     Comments: Tenderness to palpation of the left shoulder, limited range of motion secondary discomfort  Neurological:     Mental Status: He is alert and oriented to person, place, and time.     Coordination: Coordination normal.  Psychiatric:        Behavior: Behavior normal.        Thought Content: Thought content normal.        Judgment: Judgment normal.      ED Treatments / Results  Labs (all labs ordered are listed, but only abnormal results are displayed) Labs Reviewed - No data to display  EKG None  Radiology No results found.  Procedures Procedures (including critical care time)  Medications Ordered in ED Medications - No data to display   Initial Impression / Assessment and Plan / ED Course  I have reviewed the triage vital signs and the nursing notes.  Pertinent labs & imaging results that were available during my care of the patient were reviewed by me and considered in my  medical decision making (see chart for details).        38 year old male presents today with chronic shoulder pain.  Patient has no acute changes no signs of infection.  This is patient's 10th visit in the last 6 months I had an extensive conversation with him about using the emergency room for work notes.  Patient is encouraged to follow-up as an outpatient with orthopedics.  Informed patient that we no longer provide work notes for his chronic pain issues.  Final Clinical Impressions(s) / ED Diagnoses   Final diagnoses:  Chronic left shoulder pain    ED Discharge Orders    None       Eyvonne Mechanic, [name] 03/28/18 1318    Rolan Bucco, MD 03/28/18 1337

## 2018-03-28 NOTE — ED Triage Notes (Signed)
Pt here for evaluation of left shoulder pain that "won't go away." States pain is 7/10 and describes it as throbbing. Reports that he "won't let it heal because he has to keep going to work." VSS at this time.

## 2018-07-22 ENCOUNTER — Other Ambulatory Visit: Payer: Self-pay

## 2018-07-22 ENCOUNTER — Emergency Department (HOSPITAL_COMMUNITY)
Admission: EM | Admit: 2018-07-22 | Discharge: 2018-07-23 | Disposition: A | Discharge status: Home / Self Care | Payer: Self-pay | Attending: Emergency Medicine | Admitting: Emergency Medicine

## 2018-07-22 ENCOUNTER — Encounter (HOSPITAL_COMMUNITY): Payer: Self-pay | Admitting: Emergency Medicine

## 2018-07-22 DIAGNOSIS — I10 Essential (primary) hypertension: Secondary | ICD-10-CM | POA: Insufficient documentation

## 2018-07-22 DIAGNOSIS — R438 Other disturbances of smell and taste: Secondary | ICD-10-CM | POA: Insufficient documentation

## 2018-07-22 DIAGNOSIS — R432 Parageusia: Secondary | ICD-10-CM

## 2018-07-22 DIAGNOSIS — F17210 Nicotine dependence, cigarettes, uncomplicated: Secondary | ICD-10-CM | POA: Insufficient documentation

## 2018-07-22 NOTE — ED Triage Notes (Signed)
Pt reports losing sense of taste since yesterday. Pt denies sick contact, fever, cough, SHOB, N/V, pain or recent travel. Pt denies any other complaints

## 2018-07-23 NOTE — Discharge Instructions (Addendum)
A test for coronavirus has been obtained today.  It should result in the next 2 to 3 days.  If your test comes back positive you will need to quarantine yourself at home.  If it comes back negative however you develop shortness of breath, fever or other concerns I would still recommend that you quarantine yourself.  Your blood pressure was elevated today while in the emergency room.  Please get this rechecked in the next 1 to 2 weeks.

## 2018-07-23 NOTE — ED Provider Notes (Signed)
[facility]ENC[facility] Provider Note   CSN: [id number] Arrival date & time: 07/22/18  2234    History   Chief Complaint Chief Complaint  Patient presents with   loss of sense of taste    HPI Phillip Mcconnell is a 38 y.o. male with a past medical history of chronic back pain who presents today for evaluation of decrease since of taste.  He reports that he was at work today and he was eating a sub-when he realized he was unable to taste the onions and the rest of the sub-.  He states that his friends were making him nervous that he may have coronavirus so he is here to get checked out.  He reports that he was able to drink a Gatorade after with normal taste.  He has not lost his sense of smell.  He denies any slurred speech, confusion, facial droop, weakness, headache, or other sensory changes.  He feels like his sense of taste is back to normal.  No known coronavirus contacts.     HPI  Past Medical History:  Diagnosis Date   Chronic back pain     There are no active problems to display for this patient.   History reviewed. No pertinent surgical history.      Home Medications    Prior to Admission medications   Medication Sig Start Date End Date Taking? Authorizing Provider  acetaminophen (TYLENOL) 325 MG tablet Take 2 tablets (650 mg total) by mouth every 6 (six) hours as needed. 03/08/17   Couture, Cortni S, [name]  benzonatate (TESSALON) 100 MG capsule Take 1 capsule (100 mg total) by mouth 3 (three) times daily as needed for cough. 02/17/18   [name], [name], [name]  cephALEXin (KEFLEX) 500 MG capsule Take 1 capsule (500 mg total) by mouth 4 (four) times daily. 03/10/18   Arlyn DunningMcLean, Kelly A, [name]  clotrimazole (LOTRIMIN) 1 % cream Apply to affected area 2 times daily right ear for 10 days 03/26/18   Arby BarrettePfeiffer, Marcy, MD  cyclobenzaprine (FLEXERIL) 10 MG tablet Take 1 tablet (10 mg total) by mouth 2 (two) times daily as needed for muscle spasms.  01/18/18   Law, Waylan BogaAlexandra M, [name]  diphenhydramine-acetaminophen (TYLENOL PM) 25-500 MG TABS Take 2 tablets by mouth at bedtime as needed (for sleep).    [provider]  famotidine (PEPCID) 20 MG tablet Take 1 tablet (20 mg total) by mouth 2 (two) times daily. 04/02/17   [name], [name], [name]  fluticasone (FLONASE) 50 MCG/ACT nasal spray Place 1 spray into both nostrils daily. 02/17/18   [name], [name], [name]  guaiFENesin-dextromethorphan (ROBITUSSIN DM) 100-10 MG/5ML syrup Take 5 mLs by mouth every 4 (four) hours as needed for cough. 05/18/17   Georgiana ShoreMitchell, Jessica B, [name]  ibuprofen (ADVIL,MOTRIN) 400 MG tablet Take 1 tablet (400 mg total) by mouth every 6 (six) hours as needed. 07/13/17   Couture, Cortni S, [name]  ibuprofen (ADVIL,MOTRIN) 800 MG tablet Take 1 tablet (800 mg total) by mouth 3 (three) times daily. 03/26/18   Arby BarrettePfeiffer, Marcy, MD  Lido-Capsaicin-Men-Methyl Sal 0.5-0.035-5-20 % PTCH Apply 1 patch topically daily. 02/09/17   [name], Elmer SowMichael M, [name]  loratadine (CLARITIN) 10 MG tablet Take 1 tablet (10 mg total) by mouth daily. 03/08/17   Couture, Cortni S, [name]  methocarbamol (ROBAXIN) 500 MG tablet Take 2 tablets (1,000 mg total) by mouth every 8 (eight) hours as needed for muscle spasms. 03/26/18   Arby BarrettePfeiffer, Marcy, MD  methocarbamol (ROBAXIN)  750 MG tablet Take 1-2 tablets (750-1,500 mg total) by mouth 3 (three) times daily as needed for muscle spasms. 09/13/17   Lorin Glass, [name]  naproxen (NAPROSYN) 500 MG tablet Take 1 tablet (500 mg total) by mouth 2 (two) times daily. 02/17/18   [name], Glynda Jaeger, [name]  oseltamivir (TAMIFLU) 75 MG capsule Take 1 capsule (75 mg total) by mouth every 12 (twelve) hours. 02/17/18   [name], [name], [name]  sodium chloride (OCEAN) 0.65 % SOLN nasal spray Place 1 spray into both nostrils as needed for congestion. 05/18/17   Avie Echevaria B, [name]  sucralfate (CARAFATE) 1 g tablet Take 1 tablet (1 g total) by mouth 4 (four) times daily  -  with meals and at bedtime. 04/02/17   Larene Pickett, [name]  triamcinolone cream (KENALOG) 0.1 % Apply 1 application topically 2 (two) times daily. 03/10/18   Kristine Royal    Family History History reviewed. No pertinent family history.  Social History Social History   Tobacco Use   Smoking status: Current Every Day Smoker    Packs/day: 1.00    Types: Cigarettes   Smokeless tobacco: Never Used  Substance Use Topics   Alcohol use: Yes    Alcohol/week: 5.0 standard drinks    Types: 5 Cans of beer per week    Comment: occ   Drug use: No     Allergies   Patient has no known allergies.   Review of Systems Review of Systems  Constitutional: Negative for chills and fever.  Respiratory: Negative for cough and shortness of breath.   Neurological: Negative for dizziness, seizures, syncope, facial asymmetry, speech difficulty, weakness, numbness and headaches.       Transient loss of sense of taste  All other systems reviewed and are negative.    Physical Exam Updated Vital Signs BP (!) 159/87 (BP Location: Right Arm)    Pulse 70    Temp 99 F (37.2 C) (Oral)    Resp 16    SpO2 100%   Physical Exam Vitals signs and nursing note reviewed.  Constitutional:      General: He is not in acute distress. HENT:     Head: Normocephalic and atraumatic.     Nose: Nose normal.     Mouth/Throat:     Mouth: Mucous membranes are moist.  Neck:     Musculoskeletal: Normal range of motion. No neck rigidity.  Cardiovascular:     Rate and Rhythm: Normal rate.  Pulmonary:     Effort: Pulmonary effort is normal. No respiratory distress.     Breath sounds: Normal breath sounds.  Abdominal:     General: Abdomen is flat.  Neurological:     General: No focal deficit present.     Mental Status: He is alert and oriented to person, place, and time.     Motor: No weakness.     Comments: Patient is awake and alert.  There is no facial droop.  Facial movements are symmetrical without  obvious weakness.  There is no slurred speech.  Full EOM bilaterally.  5/5 strength in bilateral upper and lower extremities.   Psychiatric:        Mood and Affect: Mood normal.        Behavior: Behavior normal.      ED Treatments / Results  Labs (all labs ordered are listed, but only abnormal results are displayed) Labs Reviewed  NOVEL CORONAVIRUS, NAA (HOSPITAL ORDER, SEND-OUT TO REF LAB)  EKG None  Radiology No results found.  Procedures Procedures (including critical care time)  Medications Ordered in ED Medications - No data to display   Initial Impression / Assessment and Plan / ED Course  I have reviewed the triage vital signs and the nursing notes.  Pertinent labs & imaging results that were available during my care of the patient were reviewed by me and considered in my medical decision making (see chart for details).       Patient presents today for evaluation of loss of sense of taste.  He reports that he was unable to taste the onions on his sandwich that he was eating earlier today however he was then able to taste a Gatorade that he drank and feels like his sense of taste is back to normal.  He denies any known covid contacts.  COVID testing was sent.  Recommended he quarantine at home until result.  No evidence of neurologic deficits at this time.   Return precautions were discussed with patient who states their understanding.  At the time of discharge patient denied any unaddressed complaints or concerns.  Patient is agreeable for discharge home.   Final Clinical Impressions(s) / ED Diagnoses   Final diagnoses:  Decreased sense of taste  Hypertension, unspecified type    ED Discharge Orders    None       Norman ClayHammond, Rosaelena Kemnitz W, [name] 07/23/18 [id number]    Zadie RhineWickline, Donald, MD 07/23/18 [phone]

## 2018-07-31 ENCOUNTER — Other Ambulatory Visit: Payer: Self-pay

## 2018-07-31 ENCOUNTER — Encounter (HOSPITAL_COMMUNITY): Payer: Self-pay | Admitting: Emergency Medicine

## 2018-07-31 ENCOUNTER — Emergency Department (HOSPITAL_COMMUNITY)
Admission: EM | Admit: 2018-07-31 | Discharge: 2018-07-31 | Disposition: A | Discharge status: Home / Self Care | Payer: Medicaid Other | Attending: Emergency Medicine | Admitting: Emergency Medicine

## 2018-07-31 DIAGNOSIS — Z0000 Encounter for general adult medical examination without abnormal findings: Secondary | ICD-10-CM

## 2018-07-31 NOTE — ED Provider Notes (Signed)
[facility]   CSN: [id number] Arrival date & time: 07/31/18  1007    History   Chief Complaint No chief complaint on file.   HPI Phillip Mcconnell is a 38 y.o. male who presents to the ED today for work clearance. Pt was seen on 06/20 after experiencing an episode of loss of taste earlier that day while eating a sub sandwich. While pt was in the ED his taste came back; he was concerned that he could have coronavirus. No known contact with covid 19 positive patient. Pt was told to remain at home for 10 days and could only return to work if his test results were negative; it appears patient was never tested for coronavirus. He has remained at home for the past 10 days without any symptoms; he has been monitoring his temperature and denies fever of any kind. No chills, cough, shortness of breath, loss of taste/smell, diarrhea, or any other symptoms. Pt tried to go back to work today but given his work note stated he can only return after he receives test results he was told he cannot return to work.        Past Medical History:  Diagnosis Date  . Chronic back pain     There are no active problems to display for this patient.   History reviewed. No pertinent surgical history.      Home Medications    Prior to Admission medications   Medication Sig Start Date End Date Taking? Authorizing Provider  acetaminophen (TYLENOL) 325 MG tablet Take 2 tablets (650 mg total) by mouth every 6 (six) hours as needed. 03/08/17   Couture, Cortni S, [name]  benzonatate (TESSALON) 100 MG capsule Take 1 capsule (100 mg total) by mouth 3 (three) times daily as needed for cough. 02/17/18   [name], [name], [name]  cephALEXin (KEFLEX) 500 MG capsule Take 1 capsule (500 mg total) by mouth 4 (four) times daily. 03/10/18   Arlyn DunningMcLean, Kelly A, [name]  clotrimazole (LOTRIMIN) 1 % cream Apply to affected area 2 times daily right ear for 10 days 03/26/18   Arby BarrettePfeiffer,  Marcy, MD  cyclobenzaprine (FLEXERIL) 10 MG tablet Take 1 tablet (10 mg total) by mouth 2 (two) times daily as needed for muscle spasms. 01/18/18   Law, Waylan BogaAlexandra M, [name]  diphenhydramine-acetaminophen (TYLENOL PM) 25-500 MG TABS Take 2 tablets by mouth at bedtime as needed (for sleep).    [provider]  famotidine (PEPCID) 20 MG tablet Take 1 tablet (20 mg total) by mouth 2 (two) times daily. 04/02/17   [name], [name], [name]  fluticasone (FLONASE) 50 MCG/ACT nasal spray Place 1 spray into both nostrils daily. 02/17/18   [name], [name], [name]  guaiFENesin-dextromethorphan (ROBITUSSIN DM) 100-10 MG/5ML syrup Take 5 mLs by mouth every 4 (four) hours as needed for cough. 05/18/17   Georgiana ShoreMitchell, Jessica B, [name]  ibuprofen (ADVIL,MOTRIN) 400 MG tablet Take 1 tablet (400 mg total) by mouth every 6 (six) hours as needed. 07/13/17   Couture, Cortni S, [name]  ibuprofen (ADVIL,MOTRIN) 800 MG tablet Take 1 tablet (800 mg total) by mouth 3 (three) times daily. 03/26/18   Arby BarrettePfeiffer, Marcy, MD  Lido-Capsaicin-Men-Methyl Sal 0.5-0.035-5-20 % PTCH Apply 1 patch topically daily. 02/09/17   [name], Elmer SowMichael M, [name]  loratadine (CLARITIN) 10 MG tablet Take 1 tablet (10 mg total) by mouth daily. 03/08/17   Couture, Cortni S, [name]  methocarbamol (ROBAXIN) 500 MG tablet Take 2 tablets (1,000 mg total)  by mouth every 8 (eight) hours as needed for muscle spasms. 03/26/18   Arby BarrettePfeiffer, Marcy, MD  methocarbamol (ROBAXIN) 750 MG tablet Take 1-2 tablets (750-1,500 mg total) by mouth 3 (three) times daily as needed for muscle spasms. 09/13/17   Cristina GongHammond, Elizabeth W, [name]  naproxen (NAPROSYN) 500 MG tablet Take 1 tablet (500 mg total) by mouth 2 (two) times daily. 02/17/18   [name], Pleas KochSamantha R, [name]  oseltamivir (TAMIFLU) 75 MG capsule Take 1 capsule (75 mg total) by mouth every 12 (twelve) hours. 02/17/18   [name], [name], [name]  sodium chloride (OCEAN) 0.65 % SOLN nasal spray Place 1 spray into both nostrils as needed for  congestion. 05/18/17   Mathews RobinsonsMitchell, Jessica B, [name]  sucralfate (CARAFATE) 1 g tablet Take 1 tablet (1 g total) by mouth 4 (four) times daily -  with meals and at bedtime. 04/02/17   [name], [name], [name]  triamcinolone cream (KENALOG) 0.1 % Apply 1 application topically 2 (two) times daily. 03/10/18   Jeral PinchMcLean, Kelly A, [name]    Family History History reviewed. No pertinent family history.  Social History Social History   Tobacco Use  . Smoking status: Current Every Day Smoker    Packs/day: 1.00    Types: Cigarettes  . Smokeless tobacco: Never Used  Substance Use Topics  . Alcohol use: Yes    Alcohol/week: 5.0 standard drinks    Types: 5 Cans of beer per week    Comment: occ  . Drug use: No     Allergies   Patient has no known allergies.   Review of Systems Review of Systems  Constitutional: Negative for chills and fever.  Respiratory: Negative for cough and shortness of breath.   Gastrointestinal: Negative for diarrhea.     Physical Exam Updated Vital Signs BP (!) 162/114 (BP Location: Right Arm)   Pulse 99   Temp 98.5 F (36.9 C) (Oral)   Resp 16   SpO2 98%   Physical Exam Vitals signs and nursing note reviewed.  Constitutional:      Appearance: He is not ill-appearing.  HENT:     Head: Normocephalic and atraumatic.  Eyes:     Conjunctiva/sclera: Conjunctivae normal.  Cardiovascular:     Rate and Rhythm: Normal rate and regular rhythm.  Pulmonary:     Effort: Pulmonary effort is normal.     Breath sounds: Normal breath sounds.  Skin:    General: Skin is warm and dry.     Coloration: Skin is not jaundiced.  Neurological:     Mental Status: He is alert.      ED Treatments / Results  Labs (all labs ordered are listed, but only abnormal results are displayed) Labs Reviewed - No data to display  EKG None  Radiology No results found.  Procedures Procedures (including critical care time)  Medications Ordered in ED Medications - No data to display    Initial Impression / Assessment and Plan / ED Course  I have reviewed the triage vital signs and the nursing notes.  Pertinent labs & imaging results that were available during my care of the patient were reviewed by me and considered in my medical decision making (see chart for details).    PT is a 38 year old male presenting to the ED today for work clearance; he was seen 06/20 after an episode of loss of taste; per chart review a covid 19 send out test was ordered but was never done; pt was told to remain at  home for 10 days to self quarantine but he may return to work sooner if his test results come back negative. Pt has been monitoring symptoms at home; has had 0 symptoms since being seen in the ED previously. He reports his loss of taste resolved while in the ED last time. Pt would like to be cleared to go back to work. He is afebrile in the ED today and not complaining of any symptoms; given he has been self quarantining at home feel he is safe to go back to work tonight for 3rd shift. 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 Pt stable for discharge at this time.         Final Clinical Impressions(s) / ED Diagnoses   Final diagnoses:  Encounter for health maintenance examination in adult    ED Discharge Orders    None       Eustaquio Maize, [name] 07/31/18 1637    Charlesetta Shanks, MD 08/01/18 0800

## 2018-07-31 NOTE — Discharge Instructions (Addendum)
You were seen in the ED today to be cleared to go back to work; it appears that a covid 19 test was not sent 10 days ago; given you have had no symptoms in over 10 days time and have been self quarantining at home it is okay for you to go back.

## 2018-07-31 NOTE — ED Triage Notes (Signed)
Pt states his employer sent him for a Covid test.  Pt states has no known symptoms.  Denies any sick contacts or travel. Pt states he feels perfectly fine.

## 2018-07-31 NOTE — ED Notes (Signed)
Discharge instructions discussed with Pt. Pt verbalized understanding. Pt stable and ambulatory.    

## 2018-08-11 ENCOUNTER — Other Ambulatory Visit: Payer: Self-pay

## 2018-08-11 ENCOUNTER — Emergency Department (HOSPITAL_COMMUNITY)
Admission: EM | Admit: 2018-08-11 | Discharge: 2018-08-11 | Disposition: A | Discharge status: Home / Self Care | Payer: Medicaid Other | Attending: Emergency Medicine | Admitting: Emergency Medicine

## 2018-08-11 ENCOUNTER — Encounter (HOSPITAL_COMMUNITY): Payer: Self-pay

## 2018-08-11 DIAGNOSIS — L0291 Cutaneous abscess, unspecified: Secondary | ICD-10-CM

## 2018-08-11 DIAGNOSIS — F17210 Nicotine dependence, cigarettes, uncomplicated: Secondary | ICD-10-CM | POA: Insufficient documentation

## 2018-08-11 DIAGNOSIS — Z79899 Other long term (current) drug therapy: Secondary | ICD-10-CM | POA: Insufficient documentation

## 2018-08-11 DIAGNOSIS — L0201 Cutaneous abscess of face: Secondary | ICD-10-CM | POA: Insufficient documentation

## 2018-08-11 MED ORDER — DOXYCYCLINE HYCLATE 100 MG PO CAPS: 100.0000 mg | ORAL_CAPSULE | Freq: Two times a day (BID) | ORAL | 0 refills | Status: AC

## 2018-08-11 MED ORDER — LIDOCAINE HCL 2 % IJ SOLN
10.0000 mL | Freq: Once | INTRAMUSCULAR | Status: DC
  Filled 2018-08-11: qty 20

## 2018-08-11 NOTE — ED Provider Notes (Signed)
[facility] Provider Note   CSN: [id number] Arrival date & time: 08/11/18  1603    History   Chief Complaint Chief Complaint  Patient presents with  . Abscess    HPI Phillip Mcconnell is a 38 y.o. male.     HPI   Patient is a 38 year old male who presents the emergency department today for evaluation of a possible abscess.  He states that several days ago he had a small bump to the left side of his face.  He states he popped this with his fingers and had a little bit of pus come out.  He later developed increased redness, swelling and pain to the left side of his face.  States he has had an abscess in the same area several years ago.  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  He has had no fevers or other systemic symptoms.  Past Medical History:  Diagnosis Date  . Chronic back pain     There are no active problems to display for this patient.   History reviewed. No pertinent surgical history.      Home Medications    Prior to Admission medications   Medication Sig Start Date End Date Taking? Authorizing Provider  acetaminophen (TYLENOL) 325 MG tablet Take 2 tablets (650 mg total) by mouth every 6 (six) hours as needed. 03/08/17   Veida Spira S, [name]  benzonatate (TESSALON) 100 MG capsule Take 1 capsule (100 mg total) by mouth 3 (three) times daily as needed for cough. 02/17/18   [name], [name], [name]  cephALEXin (KEFLEX) 500 MG capsule Take 1 capsule (500 mg total) by mouth 4 (four) times daily. 03/10/18   Alveria Apley, [name]  clotrimazole (LOTRIMIN) 1 % cream Apply to affected area 2 times daily right ear for 10 days 03/26/18   Charlesetta Shanks, MD  cyclobenzaprine (FLEXERIL) 10 MG tablet Take 1 tablet (10 mg total) by mouth 2 (two) times daily as needed for muscle spasms. 01/18/18   Law, Bea Graff, [name]  diphenhydramine-acetaminophen  (TYLENOL PM) 25-500 MG TABS Take 2 tablets by mouth at bedtime as needed (for sleep).    [provider]  famotidine (PEPCID) 20 MG tablet Take 1 tablet (20 mg total) by mouth 2 (two) times daily. 04/02/17   Larene Pickett, [name]  fluticasone (FLONASE) 50 MCG/ACT nasal spray Place 1 spray into both nostrils daily. 02/17/18   [name], [name], [name]  guaiFENesin-dextromethorphan (ROBITUSSIN DM) 100-10 MG/5ML syrup Take 5 mLs by mouth every 4 (four) hours as needed for cough. 05/18/17   Emeline General, [name]  ibuprofen (ADVIL,MOTRIN) 400 MG tablet Take 1 tablet (400 mg total) by mouth every 6 (six) hours as needed. 07/13/17   Aleiah Mohammed S, [name]  ibuprofen (ADVIL,MOTRIN) 800 MG tablet Take 1 tablet (800 mg total) by mouth 3 (three) times daily. 03/26/18   Charlesetta Shanks, MD  Lido-Capsaicin-Men-Methyl Sal 0.5-0.035-5-20 % PTCH Apply 1 patch topically daily. 02/09/17   [name], Barth Kirks, [name]  loratadine (CLARITIN) 10 MG tablet Take 1 tablet (10 mg total) by mouth daily. 03/08/17   Kesean Serviss S, [name]  methocarbamol (ROBAXIN) 500 MG tablet Take 2 tablets (1,000 mg total) by mouth every 8 (eight) hours as needed for muscle spasms. 03/26/18   Charlesetta Shanks, MD  methocarbamol (ROBAXIN) 750 MG tablet Take 1-2 tablets (750-1,500 mg total)  by mouth 3 (three) times daily as needed for muscle spasms. 09/13/17   Cristina GongHammond, Elizabeth W, [name]  naproxen (NAPROSYN) 500 MG tablet Take 1 tablet (500 mg total) by mouth 2 (two) times daily. 02/17/18   [name], Pleas KochSamantha R, [name]  oseltamivir (TAMIFLU) 75 MG capsule Take 1 capsule (75 mg total) by mouth every 12 (twelve) hours. 02/17/18   [name], [name], [name]  sodium chloride (OCEAN) 0.65 % SOLN nasal spray Place 1 spray into both nostrils as needed for congestion. 05/18/17   Mathews RobinsonsMitchell, Jessica B, [name]  sucralfate (CARAFATE) 1 g tablet Take 1 tablet (1 g total) by mouth 4 (four) times daily -  with meals and at bedtime. 04/02/17   [name], [name], [name]   triamcinolone cream (KENALOG) 0.1 % Apply 1 application topically 2 (two) times daily. 03/10/18   Arlyn DunningMcLean, Kelly A, [name]    Family History No family history on file.  Social History Social History   Tobacco Use  . Smoking status: Current Every Day Smoker    Packs/day: 1.00    Types: Cigarettes  . Smokeless tobacco: Never Used  Substance Use Topics  . Alcohol use: Yes    Alcohol/week: 5.0 standard drinks    Types: 5 Cans of beer per week    Comment: occ  . Drug use: No     Allergies   Patient has no known allergies.   Review of Systems Review of Systems  Constitutional: Negative for fever.  Skin: Positive for color change.       abscess     Physical Exam Updated Vital Signs BP (!) 145/92 (BP Location: Right Arm)   Pulse (!) 101   Temp 98.5 F (36.9 C) (Oral)   Resp 16   SpO2 99%   Physical Exam Constitutional:      General: He is not in acute distress.    Appearance: He is well-developed.  HENT:     Head:     Comments: 1.5 x 1.5 cm area of fluctuance just anterior and inferior to the lobule of the left ear.  This area is mildly tender and erythematous.  No significant surrounding erythema or induration.  No swelling to the auricle or pinna. Eyes:     Conjunctiva/sclera: Conjunctivae normal.  Cardiovascular:     Rate and Rhythm: Normal rate and regular rhythm.  Pulmonary:     Effort: Pulmonary effort is normal.     Breath sounds: Normal breath sounds.  Skin:    General: Skin is warm and dry.  Neurological:     Mental Status: He is alert and oriented to person, place, and time.      ED Treatments / Results  Labs (all labs ordered are listed, but only abnormal results are displayed) Labs Reviewed - No data to display  EKG None  Radiology No results found.  Procedures .Marland Kitchen.Incision and Drainage  Date/Time: 08/11/2018 6:12 PM Performed by: Karrie Meresouture, Audery Wassenaar S, [name] Authorized by: Karrie Meresouture, Tanyah Debruyne S, [name]   Consent:    Consent obtained:  Verbal    Consent given by:  Patient   Risks discussed:  Bleeding, incomplete drainage, pain and damage to other organs   Alternatives discussed:  No treatment Universal protocol:    Procedure explained and questions answered to patient or proxy's satisfaction: yes     Relevant documents present and verified: yes     Test results available and properly labeled: yes     Imaging studies available: yes     Required blood products,  implants, devices, and special equipment available: yes     Site/side marked: yes     Immediately prior to procedure a time out was called: yes     Patient identity confirmed:  Verbally with patient Location:    Type:  Abscess Pre-procedure details:    Skin preparation:  Betadine Anesthesia (see MAR for exact dosages):    Anesthesia method:  Local infiltration   Local anesthetic:  Lidocaine 2% WITH epi Procedure type:    Complexity:  Simple Procedure details:    Incision types:  Single straight   Incision depth:  Subcutaneous   Scalpel blade:  11   Wound management:  Probed and deloculated   Drainage:  Purulent   Drainage amount:  Moderate   Wound treatment:  Wound left open   Packing materials:  None Post-procedure details:    Patient tolerance of procedure:  Tolerated well, no immediate complications   (including critical care time)  Medications Ordered in ED Medications  lidocaine (XYLOCAINE) 2 % (with pres) injection 200 mg (has no administration in time range)     Initial Impression / Assessment and Plan / ED Course  I have reviewed the triage vital signs and the nursing notes.  Pertinent labs & imaging results that were available during my care of the patient were reviewed by me and considered in my medical decision making (see chart for details).   Final Clinical Impressions(s) / ED Diagnoses   Final diagnoses:  Abscess   Patient presenting with abscess just anterior to the left ear present for several days.   1.5 x 1.5 cm area of fluctuance  just anterior and inferior to the lobule of the left ear.  This area is mildly tender and erythematous.  No significant surrounding erythema or induration.  No swelling to the auricle or pinna. Encouraged home warm soaks and flushing.  No significant signs of cellulitis to the surrounding skin.  Due to the proximity of the abscess to the patient's ear, will DC with antibiotics to prevent possible spread of infection.  Have advised on wound care.  Strict return precautions were discussed.  Patient voices understanding of the plan and reasons to return.  All questions answered.  Patient stable for discharge.  ED Discharge Orders    None       Karrie MeresCouture, Chadd Tollison S, [name] 08/11/18 1812    Virgina NorfolkCuratolo, Adam, DO 08/12/18 0036

## 2018-08-11 NOTE — Discharge Instructions (Addendum)
You were given a prescription for antibiotics. Please take the antibiotic prescription fully.   Please follow up with your primary care provider within 5-7 days for re-evaluation of your symptoms. If you do not have a primary care provider, information for a healthcare clinic has been provided for you to make arrangements for follow up care.  Please return to the emergency room immediately if you experience any new or worsening symptoms or any symptoms that indicate worsening infection such as fevers, increased redness/swelling/pain, warmth, or drainage from the affected area.    

## 2018-08-11 NOTE — ED Triage Notes (Signed)
Pt has abscess to left side of his face near his ear for the past 3 days. Pt taking ibuprofen and warm compresses. No drainage noted.

## 2018-09-23 ENCOUNTER — Emergency Department (HOSPITAL_COMMUNITY)
Admission: EM | Admit: 2018-09-23 | Discharge: 2018-09-23 | Disposition: A | Discharge status: Home / Self Care | Payer: Medicaid Other | Attending: Emergency Medicine | Admitting: Emergency Medicine

## 2018-09-23 ENCOUNTER — Encounter (HOSPITAL_COMMUNITY): Payer: Self-pay | Admitting: Emergency Medicine

## 2018-09-23 ENCOUNTER — Other Ambulatory Visit: Payer: Self-pay

## 2018-09-23 DIAGNOSIS — Z79899 Other long term (current) drug therapy: Secondary | ICD-10-CM | POA: Insufficient documentation

## 2018-09-23 DIAGNOSIS — M546 Pain in thoracic spine: Secondary | ICD-10-CM | POA: Insufficient documentation

## 2018-09-23 DIAGNOSIS — G8929 Other chronic pain: Secondary | ICD-10-CM | POA: Insufficient documentation

## 2018-09-23 DIAGNOSIS — F17210 Nicotine dependence, cigarettes, uncomplicated: Secondary | ICD-10-CM | POA: Insufficient documentation

## 2018-09-23 MED ORDER — PREDNISONE 10 MG (21) PO TBPK: ORAL_TABLET | Freq: Every day | ORAL | 0 refills | Status: DC

## 2018-09-23 NOTE — Discharge Instructions (Addendum)
You may alternate taking Tylenol and Ibuprofen as needed for pain control. You may take 400-600 mg of ibuprofen every 6 hours and [phone] mg of Tylenol every 6 hours. Do not exceed 4000 mg of Tylenol daily as this can lead to liver damage. Also, make sure to take Ibuprofen with meals as it can cause an upset stomach. Do not take other NSAIDs while taking Ibuprofen such as (Aleve, Naprosyn, Aspirin, Celebrex, etc) and do not take more than the prescribed dose as this can lead to ulcers and bleeding in your GI tract. You may use warm and cold compresses to help with your symptoms.  Take prednisone as directed   Please follow up with your primary doctor within the next 7-10 days for re-evaluation and further treatment of your symptoms.   Please return to the ER sooner if you have any new or worsening symptoms.

## 2018-09-23 NOTE — ED Provider Notes (Signed)
[facility] Provider Note   CSN: [id number] Arrival date & time: 09/23/18  1706     History   Chief Complaint Chief Complaint  Patient presents with  . Back Pain    HPI Ronalee Beltsheodore D Mccleese is a 38 y.o. male.     HPI   Patient is a 38 year old male with a history of chronic back pain who presents to the emergency department today for evaluation of acute exacerbation of his chronic right mid/lower back pain.  States that he is moving into a new home and lifted a heavy TV stand a few days ago and since then his pain is been worse.  It is constant in nature.  It does not radiate.  It has worsened since onset.  It is not relieved by Tylenol and ibuprofen.  States that he has muscle relaxers at home but has not been able to take them due to having to work long hours and it making him drowsy.  He denies any fevers.  No lower extremity weakness/numbness.  No loss control of bowel or bladder function.  No urinary retention.  No history of IV drug use or cancer.  No abdominal pain, nausea, vomiting, diarrhea, urinary complaints.   Past Medical History:  Diagnosis Date  . Chronic back pain     There are no active problems to display for this patient.   History reviewed. No pertinent surgical history.      Home Medications    Prior to Admission medications   Medication Sig Start Date End Date Taking? Authorizing Provider  acetaminophen (TYLENOL) 325 MG tablet Take 2 tablets (650 mg total) by mouth every 6 (six) hours as needed. 03/08/17   Jola Critzer S, [name]  benzonatate (TESSALON) 100 MG capsule Take 1 capsule (100 mg total) by mouth 3 (three) times daily as needed for cough. 02/17/18   [name], [name], [name]  cephALEXin (KEFLEX) 500 MG capsule Take 1 capsule (500 mg total) by mouth 4 (four) times daily. 03/10/18   Arlyn DunningMcLean, Kelly A, [name]  clotrimazole (LOTRIMIN) 1 % cream Apply to affected area 2 times daily right ear for 10 days 03/26/18    Arby BarrettePfeiffer, Marcy, MD  cyclobenzaprine (FLEXERIL) 10 MG tablet Take 1 tablet (10 mg total) by mouth 2 (two) times daily as needed for muscle spasms. 01/18/18   Law, Waylan BogaAlexandra M, [name]  diphenhydramine-acetaminophen (TYLENOL PM) 25-500 MG TABS Take 2 tablets by mouth at bedtime as needed (for sleep).    [provider]  famotidine (PEPCID) 20 MG tablet Take 1 tablet (20 mg total) by mouth 2 (two) times daily. 04/02/17   [name], [name], [name]  fluticasone (FLONASE) 50 MCG/ACT nasal spray Place 1 spray into both nostrils daily. 02/17/18   [name], [name], [name]  guaiFENesin-dextromethorphan (ROBITUSSIN DM) 100-10 MG/5ML syrup Take 5 mLs by mouth every 4 (four) hours as needed for cough. 05/18/17   Georgiana ShoreMitchell, Jessica B, [name]  ibuprofen (ADVIL,MOTRIN) 400 MG tablet Take 1 tablet (400 mg total) by mouth every 6 (six) hours as needed. 07/13/17   Mechell Girgis S, [name]  ibuprofen (ADVIL,MOTRIN) 800 MG tablet Take 1 tablet (800 mg total) by mouth 3 (three) times daily. 03/26/18   Arby BarrettePfeiffer, Marcy, MD  Lido-Capsaicin-Men-Methyl Sal 0.5-0.035-5-20 % PTCH Apply 1 patch topically daily. 02/09/17   [name], Elmer SowMichael M, [name]  loratadine (CLARITIN) 10 MG tablet Take 1 tablet (10 mg total) by mouth daily. 03/08/17   Dylin Breeden S, [name]  methocarbamol (ROBAXIN) 500  MG tablet Take 2 tablets (1,000 mg total) by mouth every 8 (eight) hours as needed for muscle spasms. 03/26/18   Arby BarrettePfeiffer, Marcy, MD  methocarbamol (ROBAXIN) 750 MG tablet Take 1-2 tablets (750-1,500 mg total) by mouth 3 (three) times daily as needed for muscle spasms. 09/13/17   Cristina GongHammond, Elizabeth W, [name]  naproxen (NAPROSYN) 500 MG tablet Take 1 tablet (500 mg total) by mouth 2 (two) times daily. 02/17/18   [name], Pleas KochSamantha R, [name]  oseltamivir (TAMIFLU) 75 MG capsule Take 1 capsule (75 mg total) by mouth every 12 (twelve) hours. 02/17/18   [name], [name], [name]  predniSONE (STERAPRED UNI-PAK 21 TAB) 10 MG (21) TBPK tablet Take by mouth daily.  Take 6 tabs by mouth daily  for 2 days, then 5 tabs for 2 days, then 4 tabs for 2 days, then 3 tabs for 2 days, 2 tabs for 2 days, then 1 tab by mouth daily for 2 days 09/23/18   Avian Greenawalt S, [name]  sodium chloride (OCEAN) 0.65 % SOLN nasal spray Place 1 spray into both nostrils as needed for congestion. 05/18/17   Mathews RobinsonsMitchell, Jessica B, [name]  sucralfate (CARAFATE) 1 g tablet Take 1 tablet (1 g total) by mouth 4 (four) times daily -  with meals and at bedtime. 04/02/17   [name], [name], [name]  triamcinolone cream (KENALOG) 0.1 % Apply 1 application topically 2 (two) times daily. 03/10/18   Arlyn DunningMcLean, Kelly A, [name]    Family History No family history on file.  Social History Social History   Tobacco Use  . Smoking status: Current Every Day Smoker    Packs/day: 1.00    Types: Cigarettes  . Smokeless tobacco: Never Used  Substance Use Topics  . Alcohol use: Yes    Alcohol/week: 5.0 standard drinks    Types: 5 Cans of beer per week    Comment: occ  . Drug use: No     Allergies   Patient has no known allergies.   Review of Systems Review of Systems  Constitutional: Negative for fever.  Respiratory: Negative for cough and shortness of breath.   Cardiovascular: Negative for chest pain.  Gastrointestinal: Negative for abdominal pain, constipation, diarrhea, nausea and vomiting.  Genitourinary: Negative for dysuria, flank pain and hematuria.  Musculoskeletal: Positive for back pain.  All other systems reviewed and are negative.    Physical Exam Updated Vital Signs BP (!) 168/99 (BP Location: Right Arm)   Pulse 85   Temp 98.9 F (37.2 C) (Oral)   Resp 16   SpO2 98%   Physical Exam Vitals signs and nursing note reviewed.  Constitutional:      Appearance: He is well-developed.  HENT:     Head: Normocephalic and atraumatic.  Eyes:     Conjunctiva/sclera: Conjunctivae normal.  Neck:     Musculoskeletal: Neck supple.  Cardiovascular:     Rate and Rhythm: Normal rate and  regular rhythm.     Heart sounds: Normal heart sounds. No murmur.  Pulmonary:     Effort: Pulmonary effort is normal. No respiratory distress.     Breath sounds: Normal breath sounds. No wheezing, rhonchi or rales.  Abdominal:     General: Bowel sounds are normal.     Palpations: Abdomen is soft.     Tenderness: There is no abdominal tenderness. There is no right CVA tenderness or left CVA tenderness.  Musculoskeletal:     Comments: No midline ttp to the thoracic or lumbar spine. TTP to the right  thoracic paraspinous muscles the reproduces pain. 5/5 strength to ble. Normal sensation throughout.   Skin:    General: Skin is warm and dry.  Neurological:     Mental Status: He is alert.      ED Treatments / Results  Labs (all labs ordered are listed, but only abnormal results are displayed) Labs Reviewed - No data to display  EKG None  Radiology No results found.  Procedures Procedures (including critical care time)  Medications Ordered in ED Medications - No data to display   Initial Impression / Assessment and Plan / ED Course  I have reviewed the triage vital signs and the nursing notes.  Pertinent labs & imaging results that were available during my care of the patient were reviewed by me and considered in my medical decision making (see chart for details).     Final Clinical Impressions(s) / ED Diagnoses   Final diagnoses:  Chronic right-sided thoracic back pain   Patient with back pain.  No neurological deficits and normal neuro exam.  Patient can walk but states is painful.  No loss of bowel or bladder control.  No concern for cauda equina.  No fever, night sweats, weight loss, h/o cancer, IVDU.  RICE protocol and pain medicine indicated and discussed with patient.  Will give steroid taper.  States he has muscle relaxers at home that he can use.  He also has ibuprofen.  Advised to continue those.  Offered Lidoderm patches and he declined.  Will give work note.  Will  give PCP follow-up.  Advised on return precautions.  He voiced understanding and is in agreement plan.  All questions answered.  Patient stable for discharge.   ED Discharge Orders         Ordered    predniSONE (STERAPRED UNI-PAK 21 TAB) 10 MG (21) TBPK tablet  Daily     09/23/18 1738           Rodney Booze, [name] 09/23/18 1746    Tegeler, Gwenyth Allegra, MD 09/23/18 [phone]

## 2018-09-23 NOTE — ED Triage Notes (Signed)
Pt c/o lower back pain "for years", but has worsened in the last few days while moving.

## 2018-09-23 NOTE — ED Notes (Signed)
Patient Alert and oriented to baseline. Stable and ambulatory to baseline. Patient verbalized understanding of the discharge instructions.  Patient belongings were taken by the patient.   

## 2018-11-14 ENCOUNTER — Emergency Department (HOSPITAL_COMMUNITY): Payer: Medicaid Other

## 2018-11-14 ENCOUNTER — Encounter (HOSPITAL_COMMUNITY): Payer: Self-pay

## 2018-11-14 ENCOUNTER — Emergency Department (HOSPITAL_COMMUNITY)
Admission: EM | Admit: 2018-11-14 | Discharge: 2018-11-15 | Disposition: A | Discharge status: Home / Self Care | Payer: Medicaid Other | Attending: Emergency Medicine | Admitting: Emergency Medicine

## 2018-11-14 ENCOUNTER — Other Ambulatory Visit: Payer: Self-pay

## 2018-11-14 DIAGNOSIS — F17210 Nicotine dependence, cigarettes, uncomplicated: Secondary | ICD-10-CM | POA: Insufficient documentation

## 2018-11-14 DIAGNOSIS — R059 Cough, unspecified: Secondary | ICD-10-CM

## 2018-11-14 DIAGNOSIS — R062 Wheezing: Secondary | ICD-10-CM | POA: Insufficient documentation

## 2018-11-14 DIAGNOSIS — Z20828 Contact with and (suspected) exposure to other viral communicable diseases: Secondary | ICD-10-CM | POA: Insufficient documentation

## 2018-11-14 DIAGNOSIS — Z79899 Other long term (current) drug therapy: Secondary | ICD-10-CM | POA: Insufficient documentation

## 2018-11-14 DIAGNOSIS — R05 Cough: Secondary | ICD-10-CM | POA: Insufficient documentation

## 2018-11-14 MED ORDER — ALBUTEROL SULFATE HFA 108 (90 BASE) MCG/ACT IN AERS
2.0000 | INHALATION_SPRAY | RESPIRATORY_TRACT | Status: DC | PRN
  Administered 2018-11-15: 01:00:00 2 via RESPIRATORY_TRACT
  Filled 2018-11-14: qty 6.7

## 2018-11-14 NOTE — ED Notes (Signed)
CURRENTLY USING PHONE 

## 2018-11-14 NOTE — ED Notes (Signed)
Pt states chest pain only occurs before and after he coughs

## 2018-11-15 NOTE — Discharge Instructions (Addendum)
Follow-up with your family doctor next week if any problems.  Use the albuterol inhaler to help with wheezing and persistent cough.

## 2018-11-15 NOTE — ED Provider Notes (Signed)
[facility] DEPT Provider Note   CSN: [id number] Arrival date & time: 11/14/18  1910     History   Chief Complaint Chief Complaint  Patient presents with  . Cough    HPI Phillip Mcconnell is a 38 y.o. male.     Patient complains of mild cough for a few days no fever no shortness of breath.  Patient does smoke  The history is provided by the patient. No language interpreter was used.  Cough Cough characteristics:  Productive Sputum characteristics:  Nondescript Severity:  Mild Onset quality:  Sudden Timing:  Constant Progression:  Unchanged Chronicity:  New Smoker: no   Context: not animal exposure   Relieved by:  Nothing Worsened by:  Nothing Associated symptoms: no chest pain, no eye discharge, no headaches and no rash     Past Medical History:  Diagnosis Date  . Chronic back pain     There are no active problems to display for this patient.   History reviewed. No pertinent surgical history.      Home Medications    Prior to Admission medications   Medication Sig Start Date End Date Taking? Authorizing Provider  acetaminophen (TYLENOL) 325 MG tablet Take 2 tablets (650 mg total) by mouth every 6 (six) hours as needed. 03/08/17   Couture, Cortni S, [name]  benzonatate (TESSALON) 100 MG capsule Take 1 capsule (100 mg total) by mouth 3 (three) times daily as needed for cough. 02/17/18   [name], [name], [name]  cephALEXin (KEFLEX) 500 MG capsule Take 1 capsule (500 mg total) by mouth 4 (four) times daily. 03/10/18   Alveria Apley, [name]  clotrimazole (LOTRIMIN) 1 % cream Apply to affected area 2 times daily right ear for 10 days 03/26/18   Charlesetta Shanks, MD  cyclobenzaprine (FLEXERIL) 10 MG tablet Take 1 tablet (10 mg total) by mouth 2 (two) times daily as needed for muscle spasms. 01/18/18   Law, Bea Graff, [name]  diphenhydramine-acetaminophen (TYLENOL PM) 25-500 MG TABS Take 2 tablets by mouth at bedtime as needed (for  sleep).    [provider]  famotidine (PEPCID) 20 MG tablet Take 1 tablet (20 mg total) by mouth 2 (two) times daily. 04/02/17   Larene Pickett, [name]  fluticasone (FLONASE) 50 MCG/ACT nasal spray Place 1 spray into both nostrils daily. 02/17/18   [name], [name], [name]  guaiFENesin-dextromethorphan (ROBITUSSIN DM) 100-10 MG/5ML syrup Take 5 mLs by mouth every 4 (four) hours as needed for cough. 05/18/17   Emeline General, [name]  ibuprofen (ADVIL,MOTRIN) 400 MG tablet Take 1 tablet (400 mg total) by mouth every 6 (six) hours as needed. 07/13/17   Couture, Cortni S, [name]  ibuprofen (ADVIL,MOTRIN) 800 MG tablet Take 1 tablet (800 mg total) by mouth 3 (three) times daily. 03/26/18   Charlesetta Shanks, MD  Lido-Capsaicin-Men-Methyl Sal 0.5-0.035-5-20 % PTCH Apply 1 patch topically daily. 02/09/17   [name], Barth Kirks, [name]  loratadine (CLARITIN) 10 MG tablet Take 1 tablet (10 mg total) by mouth daily. 03/08/17   Couture, Cortni S, [name]  methocarbamol (ROBAXIN) 500 MG tablet Take 2 tablets (1,000 mg total) by mouth every 8 (eight) hours as needed for muscle spasms. 03/26/18   Charlesetta Shanks, MD  methocarbamol (ROBAXIN) 750 MG tablet Take 1-2 tablets (750-1,500 mg total) by mouth 3 (three) times daily as needed for muscle spasms. 09/13/17   Lorin Glass, [name]  naproxen (NAPROSYN) 500 MG tablet Take 1 tablet (500 mg total) by mouth  2 (two) times daily. 02/17/18   [name], Pleas KochSamantha R, [name]  oseltamivir (TAMIFLU) 75 MG capsule Take 1 capsule (75 mg total) by mouth every 12 (twelve) hours. 02/17/18   [name], [name], [name]  predniSONE (STERAPRED UNI-PAK 21 TAB) 10 MG (21) TBPK tablet Take by mouth daily. Take 6 tabs by mouth daily  for 2 days, then 5 tabs for 2 days, then 4 tabs for 2 days, then 3 tabs for 2 days, 2 tabs for 2 days, then 1 tab by mouth daily for 2 days 09/23/18   Couture, Cortni S, [name]  sodium chloride (OCEAN) 0.65 % SOLN nasal spray Place 1 spray into both nostrils as needed  for congestion. 05/18/17   Mathews RobinsonsMitchell, Jessica B, [name]  sucralfate (CARAFATE) 1 g tablet Take 1 tablet (1 g total) by mouth 4 (four) times daily -  with meals and at bedtime. 04/02/17   [name], [name], [name]  triamcinolone cream (KENALOG) 0.1 % Apply 1 application topically 2 (two) times daily. 03/10/18   Jeral PinchMcLean, Kelly A, [name]    Family History History reviewed. No pertinent family history.  Social History Social History   Tobacco Use  . Smoking status: Current Every Day Smoker    Packs/day: 1.00    Types: Cigarettes  . Smokeless tobacco: Never Used  Substance Use Topics  . Alcohol use: Yes    Alcohol/week: 5.0 standard drinks    Types: 5 Cans of beer per week    Comment: occ  . Drug use: No     Allergies   Patient has no known allergies.   Review of Systems Review of Systems  Constitutional: Negative for appetite change and fatigue.  HENT: Negative for congestion, ear discharge and sinus pressure.   Eyes: Negative for discharge.  Respiratory: Positive for cough.   Cardiovascular: Negative for chest pain.  Gastrointestinal: Negative for abdominal pain and diarrhea.  Genitourinary: Negative for frequency and hematuria.  Musculoskeletal: Negative for back pain.  Skin: Negative for rash.  Neurological: Negative for seizures and headaches.  Psychiatric/Behavioral: Negative for hallucinations.     Physical Exam Updated Vital Signs BP (!) 183/95 (BP Location: Left Arm)   Pulse 97   Temp 98.7 F (37.1 C) (Oral)   Resp (!) 22   Ht 5\' 5"  (1.651 m)   Wt 72.6 kg   SpO2 99%   BMI 26.63 kg/m   Physical Exam Vitals signs and nursing note reviewed.  Constitutional:      Appearance: He is well-developed.  HENT:     Head: Normocephalic.     Nose: Nose normal.  Eyes:     General: No scleral icterus.    Conjunctiva/sclera: Conjunctivae normal.  Neck:     Musculoskeletal: Neck supple.     Thyroid: No thyromegaly.  Cardiovascular:     Rate and Rhythm: Normal rate and  regular rhythm.     Heart sounds: No murmur. No friction rub. No gallop.   Pulmonary:     Breath sounds: No stridor. Wheezing present. No rales.  Chest:     Chest wall: No tenderness.  Abdominal:     General: There is no distension.     Tenderness: There is no abdominal tenderness. There is no rebound.  Musculoskeletal: Normal range of motion.  Lymphadenopathy:     Cervical: No cervical adenopathy.  Skin:    Findings: No erythema or rash.  Neurological:     Mental Status: He is alert and oriented to person, place, and time.  Motor: No abnormal muscle tone.     Coordination: Coordination normal.  Psychiatric:        Behavior: Behavior normal.      ED Treatments / Results  Labs (all labs ordered are listed, but only abnormal results are displayed) Labs Reviewed  NOVEL CORONAVIRUS, NAA (HOSP ORDER, SEND-OUT TO REF LAB; TAT 18-24 HRS)    EKG None  Radiology Dg Chest 2 View  Result Date: 11/14/2018 CLINICAL DATA:  Cough EXAM: CHEST - 2 VIEW COMPARISON:  February 17, 2018 FINDINGS: Lungs are clear. Heart size and pulmonary vascularity are normal. No adenopathy. No bone lesions. IMPRESSION: No edema or consolidation. Electronically Signed   By: Bretta Bang III M.D.   On: 11/14/2018 20:26    Procedures Procedures (including critical care time)  Medications Ordered in ED Medications  albuterol (VENTOLIN HFA) 108 (90 Base) MCG/ACT inhaler 2 puff (has no administration in time range)     Initial Impression / Assessment and Plan / ED Course  I have reviewed the triage vital signs and the nursing notes.  Pertinent labs & imaging results that were available during my care of the patient were reviewed by me and considered in my medical decision making (see chart for details).    Patient with bronchitis.  Patient also has bronchospasm patient given albuterol and COVID test is a send out.  He will follow-up as needed      Final Clinical Impressions(s) / ED  Diagnoses   Final diagnoses:  Cough    ED Discharge Orders    None       Bethann Berkshire, MD 11/15/18 0009

## 2018-11-16 LAB — NOVEL CORONAVIRUS, NAA (HOSP ORDER, SEND-OUT TO REF LAB; TAT 18-24 HRS): SARS-CoV-2, NAA: NOT DETECTED

## 2018-11-17 ENCOUNTER — Telehealth: Payer: Medicaid Other | Admitting: Physician Assistant

## 2018-11-17 DIAGNOSIS — J40 Bronchitis, not specified as acute or chronic: Secondary | ICD-10-CM

## 2018-11-17 MED ORDER — ALBUTEROL SULFATE HFA 108 (90 BASE) MCG/ACT IN AERS: 2.0000 | INHALATION_SPRAY | Freq: Four times a day (QID) | RESPIRATORY_TRACT | 0 refills | Status: DC | PRN

## 2018-11-17 MED ORDER — PREDNISONE 20 MG PO TABS: 40.0000 mg | ORAL_TABLET | Freq: Every day | ORAL | 0 refills | Status: DC

## 2018-11-17 MED ORDER — BENZONATATE 100 MG PO CAPS: 100.0000 mg | ORAL_CAPSULE | Freq: Two times a day (BID) | ORAL | 0 refills | Status: DC | PRN

## 2018-11-17 NOTE — Progress Notes (Signed)
We are sorry you are not feeling well.  Here is how we plan to help!  Based on what you have shared with me, it looks like you may have a viral upper respiratory infection.  Upper respiratory infections are caused by a large number of viruses; however, rhinovirus is the most common cause.   Symptoms vary from person to person, with common symptoms including sore throat, cough, fatigue or lack of energy and feeling of general discomfort.  A low-grade fever of up to 100.4 may present, but is often uncommon.  Symptoms vary however, and are closely related to a person's age or underlying illnesses.  The most common symptoms associated with an upper respiratory infection are nasal discharge or congestion, cough, sneezing, headache and pressure in the ears and face.  These symptoms usually persist for about 3 to 10 days, but can last up to 2 weeks.  It is important to know that upper respiratory infections do not cause serious illness or complications in most cases.    Upper respiratory infections can be transmitted from person to person, with the most common method of transmission being a person's hands.  The virus is able to live on the skin and can infect other persons for up to 2 hours after direct contact.  Also, these can be transmitted when someone coughs or sneezes; thus, it is important to cover the mouth to reduce this risk.  To keep the spread of the illness at bay, good hand hygiene is very important.  This is an infection that is most likely caused by a virus. There are no specific treatments other than to help you with the symptoms until the infection runs its course.  We are sorry you are not feeling well.  Here is how we plan to help!   For nasal congestion, you may use an oral decongestants such as Mucinex D or if you have glaucoma or high blood pressure use plain Mucinex.  Saline nasal spray or nasal drops can help and can safely be used as often as needed for congestion.  For your congestion,  I have prescribed Prednisone, albuterol, and tessalon.  If you do not have a history of heart disease, hypertension, diabetes or thyroid disease, prostate/bladder issues or glaucoma, you may also use Sudafed to treat nasal congestion.  It is highly recommended that you consult with a pharmacist or your primary care physician to ensure this medication is safe for you to take.     If you have a cough, you may use cough suppressants such as Delsym and Robitussin.  If you have glaucoma or high blood pressure, you can also use Coricidin HBP.     If you have a sore or scratchy throat, use a saltwater gargle-  to  teaspoon of salt dissolved in a 4-ounce to 8-ounce glass of warm water.  Gargle the solution for approximately 15-30 seconds and then spit.  It is important not to swallow the solution.  You can also use throat lozenges/cough drops and Chloraseptic spray to help with throat pain or discomfort.  Warm or cold liquids can also be helpful in relieving throat pain.  For headache, pain or general discomfort, you can use Ibuprofen or Tylenol as directed.   Some authorities believe that zinc sprays or the use of Echinacea may shorten the course of your symptoms.   HOME CARE . Only take medications as instructed by your medical team. . Be sure to drink plenty of fluids. Water is fine as  well as fruit juices, sodas and electrolyte beverages. You may want to stay away from caffeine or alcohol. If you are nauseated, try taking small sips of liquids. How do you know if you are getting enough fluid? Your urine should be a pale yellow or almost colorless. . Get rest. . Taking a steamy shower or using a humidifier may help nasal congestion and ease sore throat pain. You can place a towel over your head and breathe in the steam from hot water coming from a faucet. . Using a saline nasal spray works much the same way. . Cough drops, hard candies and sore throat lozenges may ease your cough. . Avoid close  contacts especially the very young and the elderly . Cover your mouth if you cough or sneeze . Always remember to wash your hands.   GET HELP RIGHT AWAY IF: . You develop worsening fever. . If your symptoms do not improve within 10 days . You develop yellow or green discharge from your nose over 3 days. . You have coughing fits . You develop a severe head ache or visual changes. . You develop shortness of breath, difficulty breathing or start having chest pain . Your symptoms persist after you have completed your treatment plan  MAKE SURE YOU   Understand these instructions.  Will watch your condition.  Will get help right away if you are not doing well or get worse.  Your e-visit answers were reviewed by a board certified advanced clinical practitioner to complete your personal care plan. Depending upon the condition, your plan could have included both over the counter or prescription medications. Please review your pharmacy choice. If there is a problem, you may call our nursing hot line at and have the prescription routed to another pharmacy. Your safety is important to [place]. If you have drug allergies check your prescription carefully.   You can use MyChart to ask questions about today's visit, request a non-urgent call back, or ask for a work or school excuse for 24 hours related to this e-Visit. If it has been greater than 24 hours you will need to follow up with your provider, or enter a new e-Visit to address those concerns. You will get an e-mail in the next two days asking about your experience.  I hope that your e-visit has been valuable and will speed your recovery. Thank you for using e-visits.  Greater than 5 minutes, yet less than 10 minutes of time have been spent researching, coordinating, and implementing care for this patient today

## 2019-04-09 ENCOUNTER — Encounter (HOSPITAL_COMMUNITY): Payer: Self-pay | Admitting: Emergency Medicine

## 2019-04-09 ENCOUNTER — Emergency Department (HOSPITAL_COMMUNITY)
Admission: EM | Admit: 2019-04-09 | Discharge: 2019-04-09 | Disposition: A | Discharge status: Home / Self Care | Payer: Managed Care, Other (non HMO) | Attending: Emergency Medicine | Admitting: Emergency Medicine

## 2019-04-09 ENCOUNTER — Other Ambulatory Visit: Payer: Self-pay

## 2019-04-09 DIAGNOSIS — M62830 Muscle spasm of back: Secondary | ICD-10-CM | POA: Diagnosis not present

## 2019-04-09 DIAGNOSIS — F17210 Nicotine dependence, cigarettes, uncomplicated: Secondary | ICD-10-CM | POA: Diagnosis not present

## 2019-04-09 DIAGNOSIS — M545 Low back pain: Secondary | ICD-10-CM | POA: Diagnosis present

## 2019-04-09 MED ORDER — METHOCARBAMOL 750 MG PO TABS: 750.0000 mg | ORAL_TABLET | Freq: Every evening | ORAL | 0 refills | Status: AC | PRN

## 2019-04-09 NOTE — Discharge Instructions (Addendum)

## 2019-04-09 NOTE — ED Provider Notes (Signed)
[facility] EMERGENCY DEPARTMENT Provider Note   CSN: [id number] Arrival date & time: 04/09/19  1010     History No chief complaint on file.   Phillip Mcconnell is a 39 y.o. male.  HPI   Pt is a 39 y/o male with a h/o chronic back pain presenting to the ED for eval of back pain.  He states that he has had back pain with muscle spasms for the last several days.  It is located to his lower back.  Denies any injury or trauma but does state that he slept in a recliner a few days ago and thinks that this may have caused his symptoms.  Symptoms are unrelieved with ibuprofen at home. Pt denies any numbness/tingling/weakness to the BLE. Denies saddle anesthesia. Denies loss of control of bowels or bladder. No urinary retention. No fevers. Denies a h/o IVDU. Denies a h/o CA or recent unintended weight loss.  Denies any abdominal pain, nausea, vomiting, diarrhea, chest pain, shortness of breath.  Has had similar symptoms in the past and symptoms are improved with a muscle relaxer.   Past Medical History:  Diagnosis Date  . Chronic back pain     There are no problems to display for this patient.   No past surgical history on file.     No family history on file.  Social History   Tobacco Use  . Smoking status: Current Every Day Smoker    Packs/day: 1.00    Types: Cigarettes  . Smokeless tobacco: Never Used  Substance Use Topics  . Alcohol use: Yes    Alcohol/week: 5.0 standard drinks    Types: 5 Cans of beer per week    Comment: occ  . Drug use: No    Home Medications Prior to Admission medications   Medication Sig Start Date End Date Taking? Authorizing Provider  acetaminophen (TYLENOL) 325 MG tablet Take 2 tablets (650 mg total) by mouth every 6 (six) hours as needed. 03/08/17   Malana Eberwein S, [name]  albuterol (VENTOLIN HFA) 108 (90 Base) MCG/ACT inhaler Inhale 2 puffs into the lungs every 6 (six) hours as needed for wheezing or shortness of breath.  11/17/18   Hedges, Tinnie Gens, [name]  benzonatate (TESSALON) 100 MG capsule Take 1 capsule (100 mg total) by mouth 3 (three) times daily as needed for cough. 02/17/18   [name], [name], [name]  benzonatate (TESSALON) 100 MG capsule Take 1 capsule (100 mg total) by mouth 2 (two) times daily as needed for cough. 11/17/18   Hedges, Tinnie Gens, [name]  cephALEXin (KEFLEX) 500 MG capsule Take 1 capsule (500 mg total) by mouth 4 (four) times daily. 03/10/18   Arlyn Dunning, [name]  clotrimazole (LOTRIMIN) 1 % cream Apply to affected area 2 times daily right ear for 10 days 03/26/18   Arby Barrette, MD  cyclobenzaprine (FLEXERIL) 10 MG tablet Take 1 tablet (10 mg total) by mouth 2 (two) times daily as needed for muscle spasms. 01/18/18   Law, Waylan Boga, [name]  diphenhydramine-acetaminophen (TYLENOL PM) 25-500 MG TABS Take 2 tablets by mouth at bedtime as needed (for sleep).    [provider]  famotidine (PEPCID) 20 MG tablet Take 1 tablet (20 mg total) by mouth 2 (two) times daily. 04/02/17   [name], [name]  fluticasone (FLONASE) 50 MCG/ACT nasal spray Place 1 spray into both nostrils daily. 02/17/18   [name], [name], [name]  guaiFENesin-dextromethorphan (ROBITUSSIN DM) 100-10 MG/5ML syrup Take 5 mLs by mouth every  4 (four) hours as needed for cough. 05/18/17   Emeline General, [name]  ibuprofen (ADVIL,MOTRIN) 400 MG tablet Take 1 tablet (400 mg total) by mouth every 6 (six) hours as needed. 07/13/17   Leibish Mcgregor S, [name]  ibuprofen (ADVIL,MOTRIN) 800 MG tablet Take 1 tablet (800 mg total) by mouth 3 (three) times daily. 03/26/18   Charlesetta Shanks, MD  Lido-Capsaicin-Men-Methyl Sal 0.5-0.035-5-20 % PTCH Apply 1 patch topically daily. 02/09/17   [name], Barth Kirks, [name]  loratadine (CLARITIN) 10 MG tablet Take 1 tablet (10 mg total) by mouth daily. 03/08/17   Brina Umeda S, [name]  methocarbamol (ROBAXIN) 750 MG tablet Take 1 tablet (750 mg total) by mouth at bedtime as needed for up to 5 days  for muscle spasms. 04/09/19 04/14/19  Amarion Portell S, [name]  naproxen (NAPROSYN) 500 MG tablet Take 1 tablet (500 mg total) by mouth 2 (two) times daily. 02/17/18   [name], Glynda Jaeger, [name]  oseltamivir (TAMIFLU) 75 MG capsule Take 1 capsule (75 mg total) by mouth every 12 (twelve) hours. 02/17/18   [name], Aldona Bar R, [name]  predniSONE (DELTASONE) 20 MG tablet Take 2 tablets (40 mg total) by mouth daily with breakfast. 11/17/18   Hedges, Dellis Filbert, [name]  sodium chloride (OCEAN) 0.65 % SOLN nasal spray Place 1 spray into both nostrils as needed for congestion. 05/18/17   Avie Echevaria B, [name]  sucralfate (CARAFATE) 1 g tablet Take 1 tablet (1 g total) by mouth 4 (four) times daily -  with meals and at bedtime. 04/02/17   Larene Pickett, [name]  triamcinolone cream (KENALOG) 0.1 % Apply 1 application topically 2 (two) times daily. 03/10/18   Alveria Apley, [name]    Allergies    Patient has no known allergies.  Review of Systems   Review of Systems  Constitutional: Negative for fever.  Respiratory: Negative for shortness of breath.   Cardiovascular: Negative for chest pain.  Gastrointestinal: Negative for abdominal pain, constipation, diarrhea, nausea and vomiting.  Genitourinary:       No loss of control of bowel or bladder function  Musculoskeletal: Positive for back pain.  Neurological: Negative for weakness and numbness.    Physical Exam Updated Vital Signs BP (!) 162/110 (BP Location: Left Arm)   Pulse 90   Temp 98.7 F (37.1 C) (Oral)   Resp 16   SpO2 98%   Physical Exam Vitals and nursing note reviewed.  Constitutional:      Appearance: He is well-developed.  HENT:     Head: Normocephalic and atraumatic.  Eyes:     Conjunctiva/sclera: Conjunctivae normal.  Cardiovascular:     Rate and Rhythm: Normal rate and regular rhythm.     Heart sounds: No murmur.  Pulmonary:     Effort: Pulmonary effort is normal. No respiratory distress.     Breath sounds: Normal breath  sounds.  Abdominal:     Palpations: Abdomen is soft.     Tenderness: There is no abdominal tenderness.  Musculoskeletal:     Cervical back: Neck supple.     Comments: No midline TTP. TTP to the bilat lumbar paraspinous muscles with muscle spasm present bilaterally.   Skin:    General: Skin is warm and dry.  Neurological:     Mental Status: He is alert.     ED Results / Procedures / Treatments   Labs (all labs ordered are listed, but only abnormal results are displayed) Labs Reviewed - No data to display  EKG None  Radiology No results found.  Procedures Procedures (including critical care time)  Medications Ordered in ED Medications - No data to display  ED Course  I have reviewed the triage vital signs and the nursing notes.  Pertinent labs & imaging results that were available during my care of the patient were reviewed by me and considered in my medical decision making (see chart for details).    MDM Rules/Calculators/A&P                      Patient with back pain.  No neurological deficits and normal neuro exam.  Patient can walk but states is painful.  No loss of bowel or bladder control.  No concern for cauda equina.  No fever, night sweats, weight loss, h/o cancer, IVDU.  RICE protocol and pain medicine indicated and discussed with patient.    Final Clinical Impression(s) / ED Diagnoses Final diagnoses:  Back muscle spasm    Rx / DC Orders ED Discharge Orders         Ordered    methocarbamol (ROBAXIN) 750 MG tablet  At bedtime PRN     04/09/19 1031           Brinn Westby S, [name] 04/09/19 1032    Cathren Laine, MD 04/09/19 1400

## 2019-04-09 NOTE — ED Notes (Signed)
Patient verbalizes understanding of discharge instructions. Opportunity for questioning and answers were provided. Armband removed by staff. Patient discharged from ED.  

## 2019-04-09 NOTE — ED Triage Notes (Signed)
PT reports back spasms X 2 days; Constant spasms with movement.  He describes it as "throbbing, throbbing, and tighten up" Feels better to stand up(he is currently standing) He reports sleeping in his recliners on the first night  He thinks he may have "pulled a muscle) He took Ibuprofen that helped with the pain and not spasms. Free ROM Denies impact on bowels and bladder

## 2019-05-18 ENCOUNTER — Other Ambulatory Visit: Payer: Self-pay

## 2019-05-18 ENCOUNTER — Telehealth: Payer: Self-pay

## 2019-05-18 NOTE — Telephone Encounter (Signed)

## 2019-05-18 NOTE — Patient Instructions (Signed)
Thank you for choosing Primary Care at [facility] to be your medical home!    Phillip Mcconnell was seen by De Hollingshead, DO today.   Phillip Mcconnell's primary care provider is Marcy Siren, DO.   For the best care possible, you should try to see Marcy Siren, DO whenever you come to the clinic.   We look forward to seeing you again soon!  If you have any questions about your visit today, please call us at [phone] or feel free to reach your primary care provider via MyChart.

## 2019-05-21 ENCOUNTER — Encounter: Payer: Self-pay | Admitting: Internal Medicine

## 2019-05-21 ENCOUNTER — Ambulatory Visit (INDEPENDENT_AMBULATORY_CARE_PROVIDER_SITE_OTHER): Payer: Managed Care, Other (non HMO) | Admitting: Internal Medicine

## 2019-05-21 ENCOUNTER — Other Ambulatory Visit: Payer: Self-pay

## 2019-05-21 VITALS — BP 161/104 | HR 77 | Temp 97.3°F | Resp 17 | Ht 66.0 in | Wt 171.0 lb

## 2019-05-21 DIAGNOSIS — Z7689 Persons encountering health services in other specified circumstances: Secondary | ICD-10-CM | POA: Diagnosis not present

## 2019-05-21 DIAGNOSIS — R030 Elevated blood-pressure reading, without diagnosis of hypertension: Secondary | ICD-10-CM

## 2019-05-21 DIAGNOSIS — Z114 Encounter for screening for human immunodeficiency virus [HIV]: Secondary | ICD-10-CM

## 2019-05-21 DIAGNOSIS — M545 Low back pain: Secondary | ICD-10-CM

## 2019-05-21 DIAGNOSIS — Z13228 Encounter for screening for other metabolic disorders: Secondary | ICD-10-CM

## 2019-05-21 DIAGNOSIS — G8929 Other chronic pain: Secondary | ICD-10-CM

## 2019-05-21 MED ORDER — CYCLOBENZAPRINE HCL 10 MG PO TABS: 10.0000 mg | ORAL_TABLET | Freq: Three times a day (TID) | ORAL | 2 refills | Status: DC | PRN

## 2019-05-21 NOTE — Progress Notes (Signed)
  Subjective:    Phillip Mcconnell - 39 y.o. male MRN [id number]  Date of birth: [birth year]  HPI  Phillip Mcconnell is to establish care. Patient has no significant PMH.  Back Pain: Lower back pain started after lifting something really heavy. He went to ER on 3/8 and treated for back muscle spasm. Was taken out of work due to lifting restrictions. Pain has improved. He was given Robaxin but it upset his stomach. He has been doing stretching at home and limiting heavy lifting. Ready to return to work.    ROS per HPI     Health Maintenance:  Health Maintenance Due  Topic Date Due  . HIV Screening  Never done  . COVID-19 Vaccine (1) Never done  . TETANUS/TDAP  Never done     Past Medical History: There are no problems to display for this patient.     Social History   reports that he has been smoking cigarettes. He has been smoking about 1.00 pack per day. He has never used smokeless tobacco. He reports current alcohol use of about 5.0 standard drinks of alcohol per week. He reports that he does not use drugs.   Family History  family history is not on file.   Medications: reviewed and updated   Objective:   Physical Exam BP (!) 161/104   Pulse 77   Temp (!) 97.3 F (36.3 C) (Temporal)   Resp 17   Ht 5\' 6"  (1.676 m)   Wt 171 lb (77.6 kg)   SpO2 98%   BMI 27.60 kg/m  Physical Exam  Constitutional: He is oriented to person, place, and time and well-developed, well-nourished, and in no distress. No distress.  Cardiovascular: Normal rate.  Pulmonary/Chest: Effort normal. No respiratory distress.  Musculoskeletal:        General: Normal range of motion.  Neurological: He is alert and oriented to person, place, and time.  Skin: Skin is warm and dry. He is not diaphoretic.  Psychiatric: Affect and judgment normal.        Assessment & Plan:    1. Encounter to establish care  2. Screening for HIV (human immunodeficiency virus) - HIV antibody (with reflex)  3.  Screening for metabolic disorder - CBC with Differential - Comprehensive metabolic panel  4. Elevated blood pressure reading without diagnosis of hypertension Patient with elevated BP reading in office today. Reviewed prior readings and seems to be consistently hypertensive. Patient believes is related to nervousness over being at doctor's office. He is also a cigarette smoker. Discussed that these things can elevate BP but not typically to this significance. Return within one month for repeat BP. If still elevated, will plan to initiate therapy.   5. Chronic bilateral low back pain without sciatica Return to work note given. Patient requests trial of another muscle relaxer. Counseled on potential sedating side effects.  - cyclobenzaprine (FLEXERIL) 10 MG tablet; Take 1 tablet (10 mg total) by mouth 3 (three) times daily as needed for muscle spasms.  Dispense: 30 tablet; Refill: 2    , D.O. 05/21/2019, 1:50 PM Primary Care at [facility]

## 2019-05-22 ENCOUNTER — Other Ambulatory Visit: Payer: Self-pay | Admitting: Internal Medicine

## 2019-05-22 DIAGNOSIS — R7989 Other specified abnormal findings of blood chemistry: Secondary | ICD-10-CM

## 2019-05-22 LAB — COMPREHENSIVE METABOLIC PANEL
ALT: 33 IU/L (ref 0–44)
AST: 53 IU/L — ABNORMAL HIGH (ref 0–40)
Albumin/Globulin Ratio: 1.9 (ref 1.2–2.2)
Albumin: 4.5 g/dL (ref 4.0–5.0)
Alkaline Phosphatase: 61 IU/L (ref 39–117)
BUN/Creatinine Ratio: 8 — ABNORMAL LOW (ref 9–20)
BUN: 12 mg/dL (ref 6–20)
Bilirubin Total: 0.3 mg/dL (ref 0.0–1.2)
CO2: 28 mmol/L (ref 20–29)
Calcium: 9.4 mg/dL (ref 8.7–10.2)
Chloride: 103 mmol/L (ref 96–106)
Creatinine, Ser: 1.42 mg/dL — ABNORMAL HIGH (ref 0.76–1.27)
GFR calc Af Amer: 71 mL/min/{1.73_m2} (ref >59)
GFR calc non Af Amer: 62 mL/min/{1.73_m2} (ref >59)
Globulin, Total: 2.4 g/dL (ref 1.5–4.5)
Glucose: 91 mg/dL (ref 65–99)
Potassium: 4.8 mmol/L (ref 3.5–5.2)
Sodium: 142 mmol/L (ref 134–144)
Total Protein: 6.9 g/dL (ref 6.0–8.5)

## 2019-05-22 LAB — CBC WITH DIFFERENTIAL/PLATELET
Basophils Absolute: 0.1 10*3/uL (ref 0.0–0.2)
Basos: 1 %
EOS (ABSOLUTE): 0.5 10*3/uL — ABNORMAL HIGH (ref 0.0–0.4)
Eos: 6 %
Hematocrit: 45.6 % (ref 37.5–51.0)
Hemoglobin: 14.3 g/dL (ref 13.0–17.7)
Immature Grans (Abs): 0 10*3/uL (ref 0.0–0.1)
Immature Granulocytes: 0 %
Lymphocytes Absolute: 3.1 10*3/uL (ref 0.7–3.1)
Lymphs: 34 %
MCH: 24.4 pg — ABNORMAL LOW (ref 26.6–33.0)
MCHC: 31.4 g/dL — ABNORMAL LOW (ref 31.5–35.7)
MCV: 78 fL — ABNORMAL LOW (ref 79–97)
Monocytes Absolute: 1.4 10*3/uL — ABNORMAL HIGH (ref 0.1–0.9)
Monocytes: 15 %
Neutrophils Absolute: 4 10*3/uL (ref 1.4–7.0)
Neutrophils: 44 %
Platelets: 298 10*3/uL (ref 150–450)
RBC: 5.87 x10E6/uL — ABNORMAL HIGH (ref 4.14–5.80)
RDW: 14 % (ref 11.6–15.4)
WBC: 9.2 10*3/uL (ref 3.4–10.8)

## 2019-05-22 LAB — HIV ANTIBODY (ROUTINE TESTING W REFLEX): HIV Screen 4th Generation wRfx: NONREACTIVE

## 2019-05-24 NOTE — Progress Notes (Signed)
Patient notified of results & recommendations when he dropped off paperwork. Expressed understanding.

## 2019-05-28 ENCOUNTER — Telehealth: Payer: Self-pay | Admitting: Internal Medicine

## 2019-05-28 NOTE — Telephone Encounter (Signed)
Patient scheduled phone visit on 05/29/2019 to discuss paperwork with provider.

## 2019-05-28 NOTE — Telephone Encounter (Signed)
Patient came in about paper work

## 2019-05-29 ENCOUNTER — Telehealth (INDEPENDENT_AMBULATORY_CARE_PROVIDER_SITE_OTHER): Payer: Managed Care, Other (non HMO) | Admitting: Internal Medicine

## 2019-05-29 ENCOUNTER — Encounter: Payer: Self-pay | Admitting: Internal Medicine

## 2019-05-29 DIAGNOSIS — Z0289 Encounter for other administrative examinations: Secondary | ICD-10-CM | POA: Diagnosis not present

## 2019-05-29 DIAGNOSIS — M545 Low back pain, unspecified: Secondary | ICD-10-CM

## 2019-05-29 DIAGNOSIS — G8929 Other chronic pain: Secondary | ICD-10-CM | POA: Diagnosis not present

## 2019-05-29 NOTE — Progress Notes (Signed)
Virtual Visit via Telephone Note  I connected with Phillip Mcconnell, on 05/29/2019 at 2:52 PM by telephone due to the COVID-19 pandemic and verified that I am speaking with the correct person using two identifiers.   Consent: I discussed the limitations, risks, security and privacy concerns of performing an evaluation and management service by telephone and the availability of in person appointments. I also discussed with the patient that there may be a patient responsible charge related to this service. The patient expressed understanding and agreed to proceed.   Location of Patient: Home   Location of Provider: Clinic    Persons participating in Telemedicine visit: [name] [facility] Dr. Earlene Plater      History of Present Illness: Visit conducted today to complete paperwork for Phillip Mcconnell for time away from work due to low back pain. His job would not allow him to return to work with lifting restrictions as originally given by ED provider on 3/8. His back pain is well controlled and at previous visit we had him returning 4/21 but paperwork was required to be completed prior to return.    Past Medical History:  Diagnosis Date  . Chronic back pain    No Known Allergies  Current Outpatient Medications on File Prior to Visit  Medication Sig Dispense Refill  . cyclobenzaprine (FLEXERIL) 10 MG tablet Take 1 tablet (10 mg total) by mouth 3 (three) times daily as needed for muscle spasms. 30 tablet 2   No current facility-administered medications on file prior to visit.    Observations/Objective: NAD. Speaking clearly.  Work of breathing normal.  Alert and oriented. Mood appropriate.   Assessment and Plan: 1. Encounter for completion of form with patient FMLA paperwork completed. Will be picked up by patient later today.   2. Chronic bilateral low back pain without sciatica Resolved.    Follow Up Instructions: For routine medical care   I discussed  the assessment and treatment plan with the patient. The patient was provided an opportunity to ask questions and all were answered. The patient agreed with the plan and demonstrated an understanding of the instructions.   The patient was advised to call back or seek an in-person evaluation if the symptoms worsen or if the condition fails to improve as anticipated.     I provided 12 minutes total of non-face-to-face time during this encounter including median intraservice time, reviewing previous notes, investigations, ordering medications, medical decision making, coordinating care and patient verbalized understanding at the end of the visit.    Marcy Siren, D.O. Primary Care at [facility]  05/29/2019, 2:52 PM

## 2019-06-15 ENCOUNTER — Telehealth: Payer: Self-pay

## 2019-06-15 NOTE — Telephone Encounter (Signed)
Called patient to do their pre-visit COVID screening.  Call answered by significant other. Unable to do prescreening.

## 2019-06-18 ENCOUNTER — Ambulatory Visit (INDEPENDENT_AMBULATORY_CARE_PROVIDER_SITE_OTHER): Payer: Managed Care, Other (non HMO) | Admitting: Internal Medicine

## 2019-06-18 ENCOUNTER — Encounter: Payer: Self-pay | Admitting: Internal Medicine

## 2019-06-18 VITALS — BP 154/101 | HR 69 | Temp 97.3°F | Resp 17 | Ht 66.0 in | Wt 168.0 lb

## 2019-06-18 DIAGNOSIS — I10 Essential (primary) hypertension: Secondary | ICD-10-CM | POA: Insufficient documentation

## 2019-06-18 MED ORDER — AMLODIPINE BESYLATE 5 MG PO TABS: 5.0000 mg | ORAL_TABLET | Freq: Every day | ORAL | 3 refills | Status: DC

## 2019-06-18 NOTE — Progress Notes (Signed)
  Subjective:    Phillip Mcconnell - 39 y.o. male MRN [id number]  Date of birth: [birth year]  HPI  Phillip Mcconnell is here for follow up of BP. Was elevated at last office visit and on prior readings without h/o HTN. Does have a strong family h/o HTN.   BP Readings from Last 3 Encounters:  06/18/19 (!) 154/101  05/21/19 (!) 161/104  04/09/19 (!) 162/110       Health Maintenance Due  Topic Date Due  . COVID-19 Vaccine (1) Never done  . TETANUS/TDAP  Never done    -  reports that he has been smoking cigarettes. He has been smoking about 1.00 pack per day. He has never used smokeless tobacco. - Review of Systems: Per HPI. - Past Medical History: There are no problems to display for this patient.  - Medications: reviewed and updated   Objective:   Physical Exam Temp (!) 97.3 F (36.3 C) (Temporal)   Resp 17   Wt 168 lb (76.2 kg)   BMI 27.12 kg/m  Physical Exam         Assessment & Plan:   1. Essential hypertension Reviewed prior BP trend and today's value; all elevated. Will diagnose with HTN and start medical management. Rx for Amlodipine sent. Discussed potential side effects. Encouraged to buy cuff and monitor at home if possible for him. Emphasized that reduction in tobacco use will likely improve BP. Return in one month to f/u on BP. May need increase Amlodipine at that visit if still uncontrolled.  Counseled on blood pressure goal of less than 130/80, low-sodium, DASH diet, medication compliance, 150 minutes of moderate intensity exercise per week. Discussed medication compliance, adverse effects. - amLODipine (NORVASC) 5 MG tablet; Take 1 tablet (5 mg total) by mouth daily.  Dispense: 90 tablet; Refill: 3     Marcy Siren, D.O. 06/18/2019, 9:54 AM Primary Care at [facility]

## 2019-06-18 NOTE — Patient Instructions (Signed)

## 2019-07-13 ENCOUNTER — Telehealth: Payer: Self-pay

## 2019-07-13 NOTE — Telephone Encounter (Signed)
Called patient to do their pre-visit COVID screening.  Call went to voicemail. Unable to do prescreening.  

## 2019-07-16 ENCOUNTER — Ambulatory Visit: Payer: Managed Care, Other (non HMO) | Admitting: Internal Medicine

## 2019-09-28 ENCOUNTER — Ambulatory Visit
Admission: EM | Admit: 2019-09-28 | Discharge: 2019-09-28 | Disposition: A | Discharge status: Home / Self Care | Payer: Managed Care, Other (non HMO) | Attending: Emergency Medicine | Admitting: Emergency Medicine

## 2019-09-28 DIAGNOSIS — M546 Pain in thoracic spine: Secondary | ICD-10-CM

## 2019-09-28 DIAGNOSIS — M62838 Other muscle spasm: Secondary | ICD-10-CM | POA: Diagnosis not present

## 2019-09-28 DIAGNOSIS — G8929 Other chronic pain: Secondary | ICD-10-CM

## 2019-09-28 MED ORDER — CYCLOBENZAPRINE HCL 10 MG PO TABS: 10.0000 mg | ORAL_TABLET | Freq: Three times a day (TID) | ORAL | 0 refills | Status: DC | PRN

## 2019-09-28 MED ORDER — NAPROXEN 500 MG PO TABS: 500.0000 mg | ORAL_TABLET | Freq: Two times a day (BID) | ORAL | 0 refills | Status: DC

## 2019-09-28 NOTE — ED Provider Notes (Signed)
[name]    CSN: [id number] Arrival date & time: 09/28/19  1142      History   Chief Complaint Chief Complaint  Patient presents with  . Spasms    back     HPI Phillip Mcconnell is a 39 y.o. male history of thoracic muscle spasms presenting for right thoracic muscle spasm.  States has been for the last 3 days.  Denies trauma or inciting event.  No neck pain, upper extremity numbness or weakness, chest pain, difficulty breathing.  Did not take his cyclobenzaprine as he "did not want".  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  Needs work note so he can rest today.    Past Medical History:  Diagnosis Date  . Chronic back pain     Patient Active Problem List   Diagnosis Date Noted  . Essential hypertension 06/18/2019    History reviewed. No pertinent surgical history.     Home Medications    Prior to Admission medications   Medication Sig Start Date End Date Taking? Authorizing Provider  amLODipine (NORVASC) 5 MG tablet Take 1 tablet (5 mg total) by mouth daily. 06/18/19   Arvilla Market, DO  cyclobenzaprine (FLEXERIL) 10 MG tablet Take 1 tablet (10 mg total) by mouth 3 (three) times daily as needed for muscle spasms. 09/28/19   Hall-Potvin, [place], [name]  naproxen (NAPROSYN) 500 MG tablet Take 1 tablet (500 mg total) by mouth 2 (two) times daily. 09/28/19   Hall-Potvin, [place], [name]    Family History History reviewed. No pertinent family history.  Social History Social History   Tobacco Use  . Smoking status: Current Every Day Smoker    Packs/day: 1.00    Types: Cigarettes  . Smokeless tobacco: Never Used  Vaping Use  . Vaping Use: Never used  Substance Use Topics  . Alcohol use: Yes    Alcohol/week: 5.0 standard drinks    Types: 5 Cans of beer per week    Comment: occ  . Drug use: No      Allergies   Patient has no known allergies.   Review of Systems As per HPI   Physical Exam Triage Vital  Signs ED Triage Vitals  Enc Vitals Group     BP 09/28/19 1232 (!) 144/83     Pulse Rate 09/28/19 1232 92     Resp 09/28/19 1232 18     Temp 09/28/19 1232 98.3 F (36.8 C)     Temp Source 09/28/19 1232 Oral     SpO2 09/28/19 1232 97 %     Weight --      Height --      Head Circumference --      Peak Flow --      Pain Score 09/28/19 1233 8     Pain Loc --      Pain Edu? --      Excl. in GC? --    No data found.  Updated Vital Signs BP (!) 144/83 (BP Location: Left Arm)   Pulse 92   Temp 98.3 F (36.8 C) (Oral)   Resp 18   SpO2 97%   Visual Acuity Right Eye Distance:   Left Eye Distance:   Bilateral Distance:    Right Eye Near:   Left Eye Near:    Bilateral Near:     Physical Exam Constitutional:      General: He is not in acute distress. HENT:     Head: Normocephalic and  atraumatic.  Eyes:     General: No scleral icterus.    Pupils: Pupils are equal, round, and reactive to light.  Cardiovascular:     Rate and Rhythm: Normal rate.  Pulmonary:     Effort: Pulmonary effort is normal. No respiratory distress.     Breath sounds: No wheezing.  Musculoskeletal:        General: Tenderness present. No swelling. Normal range of motion.     Comments: Right thoracic paraspinal muscle with mild tenderness.  No crepitus, mass, rash, spasm appreciated  Skin:    Coloration: Skin is not jaundiced or pale.  Neurological:     Mental Status: He is alert and oriented to person, place, and time.      UC Treatments / Results  Labs (all labs ordered are listed, but only abnormal results are displayed) Labs Reviewed - No data to display  EKG   Radiology No results found.  Procedures Procedures (including critical care time)  Medications Ordered in UC Medications - No data to display  Initial Impression / Assessment and Plan / UC Course  I have reviewed the triage vital signs and the nursing notes.  Pertinent labs & imaging results that were available during my  care of the patient were reviewed by me and considered in my medical decision making (see chart for details).     H&P consistent with muscle spasm.  Will refill cyclobenzaprine, trial naproxen as patient has not noticed improvement in pain with ibuprofen.  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  Return precautions discussed, pt verbalized understanding and is agreeable to plan. Final Clinical Impressions(s) / UC Diagnoses   Final diagnoses:  Muscle spasm  Acute right-sided thoracic back pain     Discharge Instructions     Heat therapy (hot compress, warm wash rag, hot showers, etc.) can help relax muscles and soothe muscle aches. Cold therapy (ice packs) can be used to help swelling both after injury and after prolonged use of areas of chronic pain/aches.  Pain medication:  Naprosyn and tylenol.   May take muscle relaxer as needed for severe pain / spasm.  (This medication may cause you to become tired so it is important you do not drink alcohol or operate heavy machinery while on this medication.  Recommend your first dose to be taken before bedtime to monitor for side effects safely)  Important to follow up with specialist(s) below for further evaluation/management if your symptoms persist or worsen.    ED Prescriptions    Medication Sig Dispense Auth. Provider   cyclobenzaprine (FLEXERIL) 10 MG tablet Take 1 tablet (10 mg total) by mouth 3 (three) times daily as needed for muscle spasms. 30 tablet Hall-Potvin, [place], [name]   naproxen (NAPROSYN) 500 MG tablet Take 1 tablet (500 mg total) by mouth 2 (two) times daily. 30 tablet Hall-Potvin, [place], [name]     I have reviewed the PDMP during this encounter.   Hall-Potvin, [place], [place] 09/28/19 1248

## 2019-09-28 NOTE — ED Triage Notes (Signed)
Pt present Muscle spasms in his upper back area. Symptoms started 3 days ago. Pt did not take his medication for muscle spasm.

## 2019-09-28 NOTE — Discharge Instructions (Addendum)
Heat therapy (hot compress, warm wash rag, hot showers, etc.) can help relax muscles and soothe muscle aches. Cold therapy (ice packs) can be used to help swelling both after injury and after prolonged use of areas of chronic pain/aches.  Pain medication:  Naprosyn and tylenol.   May take muscle relaxer as needed for severe pain / spasm.  (This medication may cause you to become tired so it is important you do not drink alcohol or operate heavy machinery while on this medication.  Recommend your first dose to be taken before bedtime to monitor for side effects safely)  Important to follow up with specialist(s) below for further evaluation/management if your symptoms persist or worsen.

## 2019-11-12 ENCOUNTER — Ambulatory Visit
Admission: EM | Admit: 2019-11-12 | Discharge: 2019-11-12 | Disposition: A | Discharge status: Home / Self Care | Payer: Managed Care, Other (non HMO) | Attending: Emergency Medicine | Admitting: Emergency Medicine

## 2019-11-12 DIAGNOSIS — M545 Low back pain, unspecified: Secondary | ICD-10-CM | POA: Diagnosis not present

## 2019-11-12 MED ORDER — DICLOFENAC SODIUM 50 MG PO TBEC: 50.0000 mg | DELAYED_RELEASE_TABLET | Freq: Two times a day (BID) | ORAL | 0 refills | Status: DC

## 2019-11-12 MED ORDER — TIZANIDINE HCL 4 MG PO TABS: 4.0000 mg | ORAL_TABLET | Freq: Four times a day (QID) | ORAL | 0 refills | Status: DC | PRN

## 2019-11-12 NOTE — ED Provider Notes (Signed)
[name]    CSN: [id number] Arrival date & time: 11/12/19  1711      History   Chief Complaint Chief Complaint  Patient presents with  . Back Pain    HPI Phillip Mcconnell is a 39 y.o. male history of chronic back pain presenting today for evaluation of right-sided lower back pain.  Reports pain has been flared up since Wednesday over the past 5 to 6 days.  Reports he works at [employer] and does long hours of heavy lifting and bending.  Frequently has been working 16-17-hour days of recently.  He has been applying heat and taking naproxen without significant improvement.  He is nervous about taking the muscle relaxer after working as he often has to return to work in a few hours and does not want to have drowsiness affecting him.  He denies any radiation into the legs.  Denies numbness or tingling.  Denies any urination changes.  Denies loss of control of bowel/bladder.  History of similar in the past off and on.     HPI  Past Medical History:  Diagnosis Date  . Chronic back pain     Patient Active Problem List   Diagnosis Date Noted  . Essential hypertension 06/18/2019    History reviewed. No pertinent surgical history.     Home Medications    Prior to Admission medications   Medication Sig Start Date End Date Taking? Authorizing Provider  amLODipine (NORVASC) 5 MG tablet Take 1 tablet (5 mg total) by mouth daily. 06/18/19   Arvilla Market, DO  diclofenac (VOLTAREN) 50 MG EC tablet Take 1 tablet (50 mg total) by mouth 2 (two) times daily. 11/12/19   Alicianna Litchford C, [name]  tiZANidine (ZANAFLEX) 4 MG tablet Take 1 tablet (4 mg total) by mouth every 6 (six) hours as needed for muscle spasms. 11/12/19   Mckenzy Salazar, Junius Creamer, [name]    Family History No family history on file.  Social History Social History   Tobacco Use  . Smoking status: Current Every Day Smoker    Packs/day: 1.00    Types: Cigarettes  . Smokeless tobacco:  Never Used  Vaping Use  . Vaping Use: Never used  Substance Use Topics  . Alcohol use: Yes    Alcohol/week: 5.0 standard drinks    Types: 5 Cans of beer per week    Comment: occ  . Drug use: No     Allergies   Patient has no known allergies.   Review of Systems Review of Systems  Constitutional: Negative for fatigue and fever.  Eyes: Negative for redness, itching and visual disturbance.  Respiratory: Negative for shortness of breath.   Cardiovascular: Negative for chest pain and leg swelling.  Gastrointestinal: Negative for nausea and vomiting.  Genitourinary: Negative for decreased urine volume and difficulty urinating.  Musculoskeletal: Positive for back pain and myalgias. Negative for arthralgias.  Skin: Negative for color change, rash and wound.  Neurological: Negative for dizziness, syncope, weakness, light-headedness and headaches.     Physical Exam Triage Vital Signs ED Triage Vitals  Enc Vitals Group     BP      Pulse      Resp      Temp      Temp src      SpO2      Weight      Height      Head Circumference      Peak Flow  Pain Score      Pain Loc      Pain Edu?      Excl. in GC?    No data found.  Updated Vital Signs BP (!) 131/95 (BP Location: Left Arm)   Pulse 96   Temp 98.4 F (36.9 C) (Oral)   Resp 18   SpO2 98%   Visual Acuity Right Eye Distance:   Left Eye Distance:   Bilateral Distance:    Right Eye Near:   Left Eye Near:    Bilateral Near:     Physical Exam Vitals and nursing note reviewed.  Constitutional:      Appearance: He is well-developed.     Comments: No acute distress  HENT:     Head: Normocephalic and atraumatic.     Nose: Nose normal.  Eyes:     Conjunctiva/sclera: Conjunctivae normal.  Cardiovascular:     Rate and Rhythm: Normal rate.  Pulmonary:     Effort: Pulmonary effort is normal. No respiratory distress.  Abdominal:     General: There is no distension.  Musculoskeletal:        General: Normal  range of motion.     Cervical back: Neck supple.     Comments: Nontender to palpation of cervical and thoracic spine midline, mild tenderness to palpation to mid lumbar area midline without palpable deformity step-off or area of swelling, increased tenderness throughout right paraspinal lumbar musculature extending more laterally Strength at hips and knees 5/5 and equal bilaterally, patellar reflex 2+ bilaterally, ambulating with mild antalgia  Skin:    General: Skin is warm and dry.  Neurological:     Mental Status: He is alert and oriented to person, place, and time.      UC Treatments / Results  Labs (all labs ordered are listed, but only abnormal results are displayed) Labs Reviewed - No data to display  EKG   Radiology No results found.  Procedures Procedures (including critical care time)  Medications Ordered in UC Medications - No data to display  Initial Impression / Assessment and Plan / UC Course  I have reviewed the triage vital signs and the nursing notes.  Pertinent labs & imaging results that were available during my care of the patient were reviewed by me and considered in my medical decision making (see chart for details).     Suspect likely lumbar strain due to nature of work.  Recommending continue anti-inflammatories muscle relaxers.  Providing tizanidine to minimize drowsiness associated with muscle relaxers, diclofenac as alternative NSAID to Naprosyn.  Deferring steroids at this time.  Discussed activity modification.  Discussed strict return precautions. Patient verbalized understanding and is agreeable with plan.  Final Clinical Impressions(s) / UC Diagnoses   Final diagnoses:  Acute right-sided low back pain without sciatica     Discharge Instructions     Diclofenac twice daily for pain/swelling and inflammation back Supplement with tizanidine which is a muscle relaxer as needed when at home at bedtime Gentle stretching-see  attached Alternate ice and heat Follow-up if not improving or worsening    ED Prescriptions    Medication Sig Dispense Auth. Provider   tiZANidine (ZANAFLEX) 4 MG tablet Take 1 tablet (4 mg total) by mouth every 6 (six) hours as needed for muscle spasms. 30 tablet Afua Hoots C, [name]   diclofenac (VOLTAREN) 50 MG EC tablet Take 1 tablet (50 mg total) by mouth 2 (two) times daily. 30 tablet Krystalle Pilkington, Junius Creamer, [name]     PDMP  not reviewed this encounter.   Lew Dawes, [place] 11/12/19 1809

## 2019-11-12 NOTE — ED Triage Notes (Signed)
Pt c/o lower back pain since Wednesday. Denies pain radiating. Denies injury.

## 2019-11-12 NOTE — Discharge Instructions (Signed)
Diclofenac twice daily for pain/swelling and inflammation back Supplement with tizanidine which is a muscle relaxer as needed when at home at bedtime Gentle stretching-see attached Alternate ice and heat Follow-up if not improving or worsening

## 2019-12-11 ENCOUNTER — Ambulatory Visit
Admission: EM | Admit: 2019-12-11 | Discharge: 2019-12-11 | Disposition: A | Discharge status: Home / Self Care | Payer: Managed Care, Other (non HMO) | Attending: Emergency Medicine | Admitting: Emergency Medicine

## 2019-12-11 ENCOUNTER — Ambulatory Visit (INDEPENDENT_AMBULATORY_CARE_PROVIDER_SITE_OTHER): Payer: Managed Care, Other (non HMO)

## 2019-12-11 ENCOUNTER — Encounter: Payer: Self-pay | Admitting: Emergency Medicine

## 2019-12-11 DIAGNOSIS — M79641 Pain in right hand: Secondary | ICD-10-CM

## 2019-12-11 DIAGNOSIS — M7989 Other specified soft tissue disorders: Secondary | ICD-10-CM | POA: Diagnosis not present

## 2019-12-11 HISTORY — DX: Essential (primary) hypertension: I10

## 2019-12-11 MED ORDER — PREDNISONE 10 MG (21) PO TBPK: ORAL_TABLET | Freq: Every day | ORAL | 0 refills | Status: DC

## 2019-12-11 NOTE — ED Triage Notes (Signed)
Pt c/o rt hand pain and swelling radiating up rt arm. States pain started in rt pinky finger yesterday.

## 2019-12-11 NOTE — ED Provider Notes (Signed)
[name]    CSN: [id number] Arrival date & time: 12/11/19  0809      History   Chief Complaint Chief Complaint  Patient presents with  . Hand Pain    HPI Phillip Mcconnell is a 39 y.o. male  Right hand pain and swelling. States it began in his pinky, then extended over to his thumb since yesterday. Denies inciting event, trauma, numbness. Does have difficulty moving hand and wrist secondary pain. States that it radiates up to his elbow. No neck pain, chest pain, difficulty breathing or swelling, rash, fever. States something similar happened in 2016, though "not like this" - went away on its own, was never evaluated for this. Denies history of gout. No change in diet, lifestyle, medications.  Past Medical History:  Diagnosis Date  . Chronic back pain   . Hypertension     Patient Active Problem List   Diagnosis Date Noted  . Essential hypertension 06/18/2019    History reviewed. No pertinent surgical history.     Home Medications    Prior to Admission medications   Medication Sig Start Date End Date Taking? Authorizing Provider  amLODipine (NORVASC) 5 MG tablet Take 1 tablet (5 mg total) by mouth daily. 06/18/19  Yes Arvilla Market, DO  diclofenac (VOLTAREN) 50 MG EC tablet Take 1 tablet (50 mg total) by mouth 2 (two) times daily. 11/12/19   Wieters, Hallie C, [name]  predniSONE (STERAPRED UNI-PAK 21 TAB) 10 MG (21) TBPK tablet Take by mouth daily. Take steroid taper as written 12/11/19   Hall-Potvin, [place], [name]  tiZANidine (ZANAFLEX) 4 MG tablet Take 1 tablet (4 mg total) by mouth every 6 (six) hours as needed for muscle spasms. 11/12/19   Wieters, Junius Creamer, [name]    Family History History reviewed. No pertinent family history.  Social History Social History   Tobacco Use  . Smoking status: Current Every Day Smoker    Packs/day: 1.00    Types: Cigarettes  . Smokeless tobacco: Never Used  Vaping Use  . Vaping Use: Never used   Substance Use Topics  . Alcohol use: Yes    Alcohol/week: 5.0 standard drinks    Types: 5 Cans of beer per week    Comment: occ  . Drug use: No     Allergies   Patient has no known allergies.   Review of Systems Review of Systems  Constitutional: Negative for fatigue and fever.  Respiratory: Negative for cough and shortness of breath.   Cardiovascular: Negative for chest pain and palpitations.  Musculoskeletal:       Positive for right hand pain and swelling  Neurological: Negative for weakness and numbness.     Physical Exam Triage Vital Signs ED Triage Vitals  Enc Vitals Group     BP      Pulse      Resp      Temp      Temp src      SpO2      Weight      Height      Head Circumference      Peak Flow      Pain Score      Pain Loc      Pain Edu?      Excl. in GC?    No data found.  Updated Vital Signs BP (!) 178/113 (BP Location: Left Arm)   Pulse 92   Temp 99.5 F (37.5 C) (Oral)   Resp 18  SpO2 98%   Visual Acuity Right Eye Distance:   Left Eye Distance:   Bilateral Distance:    Right Eye Near:   Left Eye Near:    Bilateral Near:     Physical Exam Constitutional:      General: He is not in acute distress. HENT:     Head: Normocephalic and atraumatic.  Eyes:     General: No scleral icterus.    Pupils: Pupils are equal, round, and reactive to light.  Cardiovascular:     Rate and Rhythm: Normal rate.  Pulmonary:     Effort: Pulmonary effort is normal. No respiratory distress.     Breath sounds: No wheezing.  Musculoskeletal:        General: Swelling and tenderness present.     Comments: Right hand diffusely swollen, more so over fourth and fifth digits. No bony deformity, pallor. Area is warm and tender to touch as compared to left hand. No bony wrist tenderness. Neurovascularly intact  Skin:    General: Skin is warm.     Coloration: Skin is not jaundiced or pale.     Findings: No bruising.  Neurological:     Mental Status: He is  alert and oriented to person, place, and time.      UC Treatments / Results  Labs (all labs ordered are listed, but only abnormal results are displayed) Labs Reviewed - No data to display  EKG   Radiology DG Hand Complete Right  Result Date: 12/11/2019 CLINICAL DATA:  Hand pain and swelling 1 day EXAM: RIGHT HAND - COMPLETE 3+ VIEW COMPARISON:  09/04/2016 FINDINGS: There is no evidence of fracture or dislocation. There is no evidence of arthropathy or other focal bone abnormality. Soft tissues are unremarkable. Short ulna. Radiocarpal joint normal. IMPRESSION: Negative. Electronically Signed   By: Marlan Palau M.D.   On: 12/11/2019 09:08    Procedures Procedures (including critical care time)  Medications Ordered in UC Medications - No data to display  Initial Impression / Assessment and Plan / UC Course  I have reviewed the triage vital signs and the nursing notes.  Pertinent labs & imaging results that were available during my care of the patient were reviewed by me and considered in my medical decision making (see chart for details).     XR negative.  Discussed possibility of gout VS pseudogout.  Will treat with steroids, follow-up with PCP for further evaluation and management as needed.  Blood pressure is elevated: Denying signs/symptoms of endorgan damage.  Did not take blood pressure medications today, will do so when he gets home.  Return precautions discussed, pt verbalized understanding and is agreeable to plan. Final Clinical Impressions(s) / UC Diagnoses   Final diagnoses:  Right hand pain  Swelling of right hand   Discharge Instructions   None    ED Prescriptions    Medication Sig Dispense Auth. Provider   predniSONE (STERAPRED UNI-PAK 21 TAB) 10 MG (21) TBPK tablet Take by mouth daily. Take steroid taper as written 21 tablet Hall-Potvin, [place], [name]     PDMP not reviewed this encounter.   Hall-Potvin, [place], [place] 12/11/19 [phone]

## 2020-01-03 IMAGING — DX DG CHEST 2V
2 series · 2 of 2 positions shown · non-contrast
Comparison: 11/19/2012

CLINICAL DATA: Cough

EXAM:
CHEST - 2 VIEW

[chest pa]
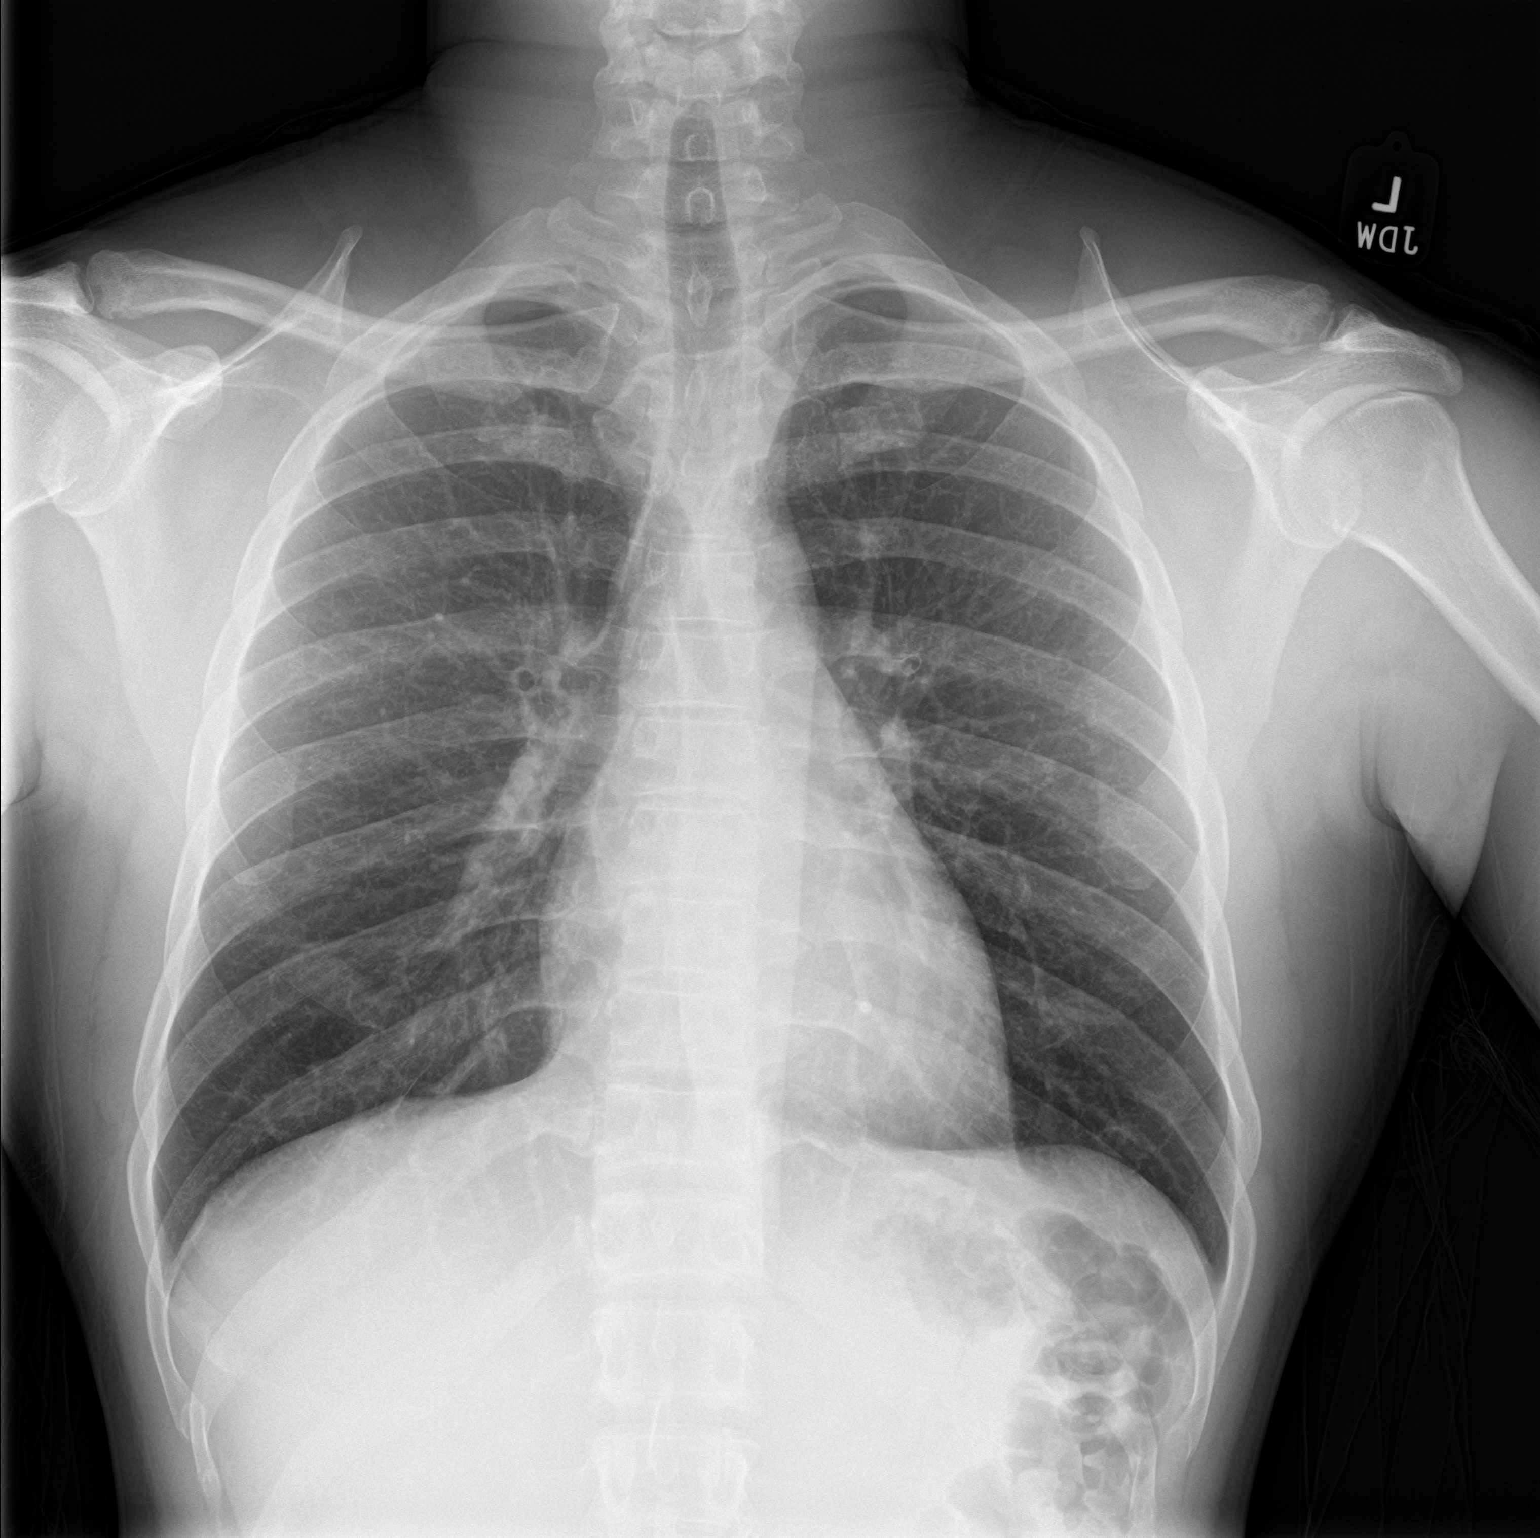

[chest lat]
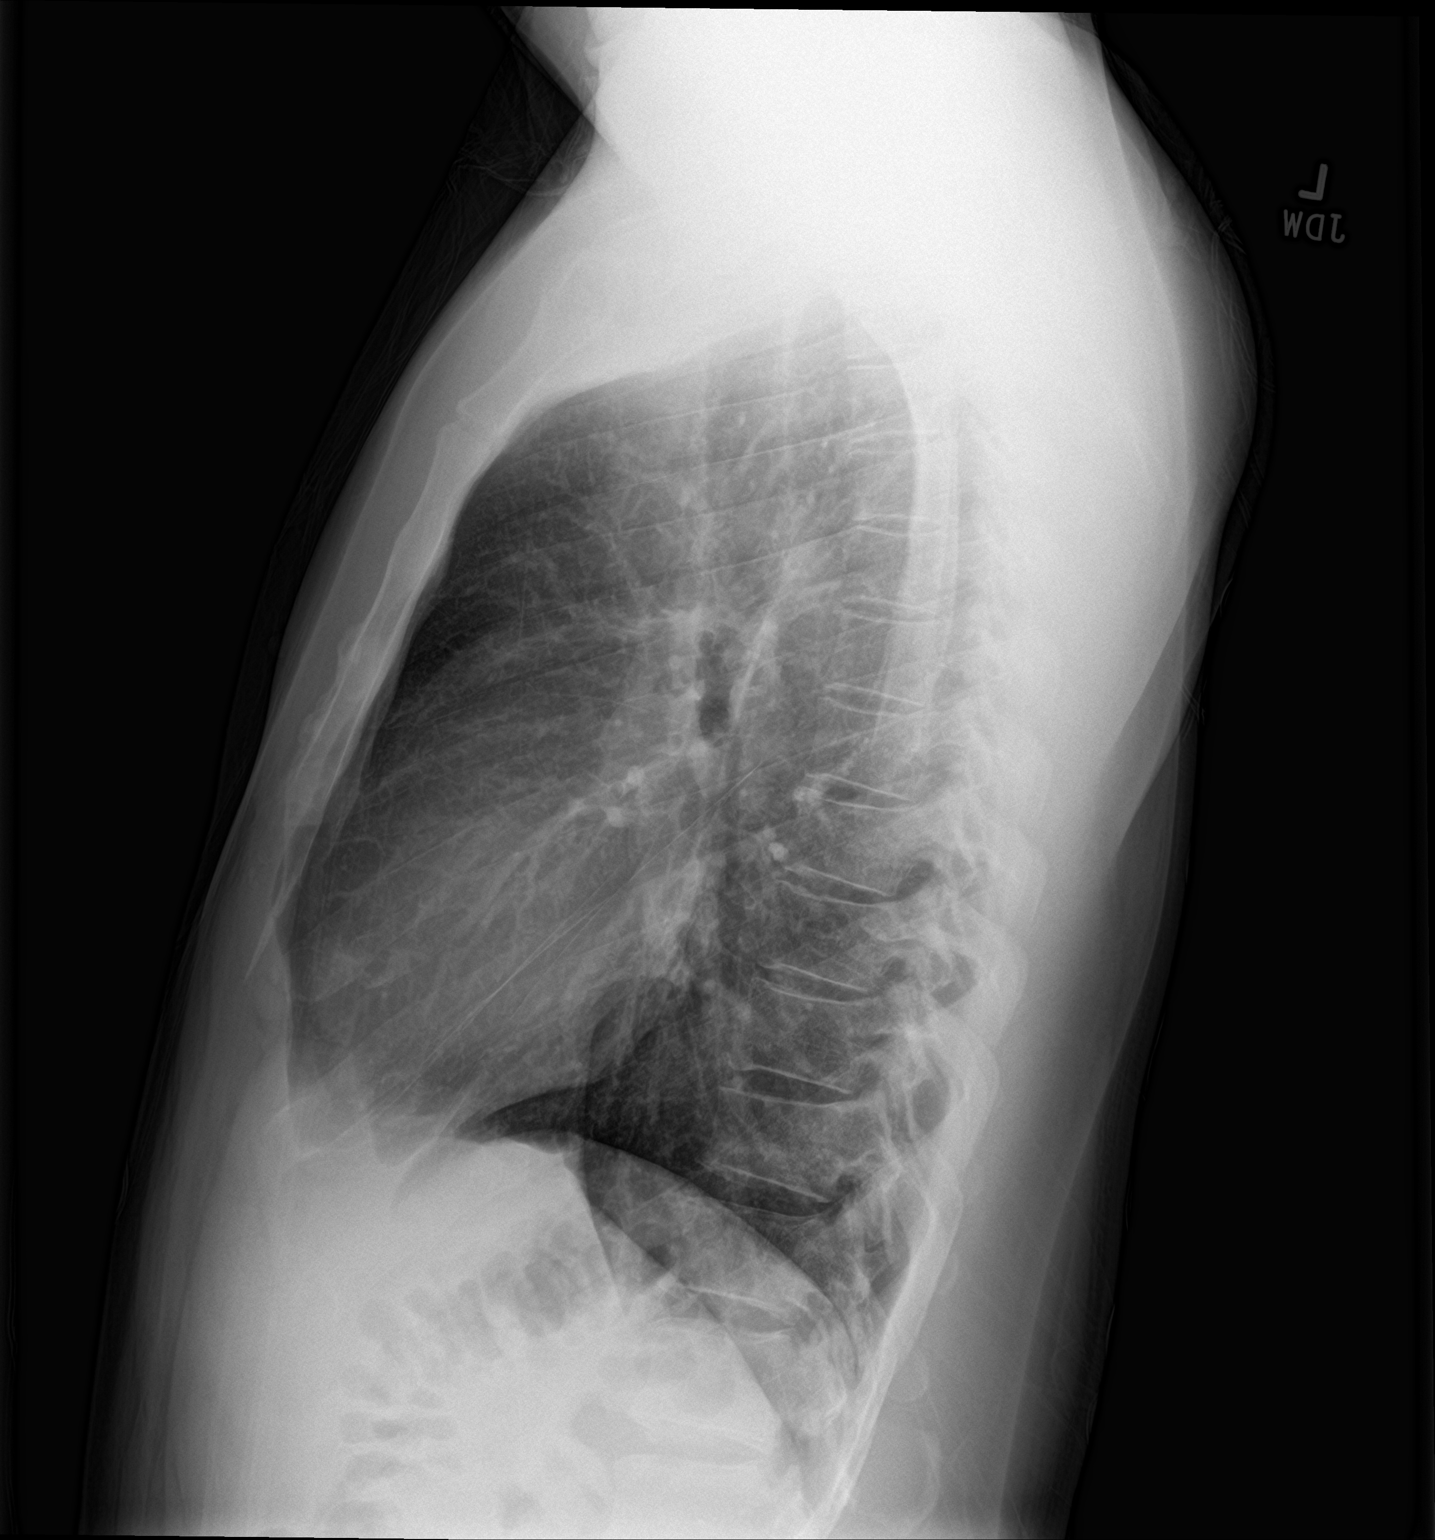

[2 of 2 positions shown; findings below may reference images not displayed]

FINDINGS: The heart size and mediastinal contours are within normal limits.
Both lungs are clear. The visualized skeletal structures are
unremarkable.
IMPRESSION: No active cardiopulmonary disease.

## 2020-01-19 ENCOUNTER — Other Ambulatory Visit: Payer: Self-pay

## 2020-01-19 ENCOUNTER — Ambulatory Visit
Admission: EM | Admit: 2020-01-19 | Discharge: 2020-01-19 | Disposition: A | Discharge status: Home / Self Care | Payer: Managed Care, Other (non HMO) | Attending: Urgent Care | Admitting: Urgent Care

## 2020-01-19 DIAGNOSIS — R42 Dizziness and giddiness: Secondary | ICD-10-CM | POA: Diagnosis not present

## 2020-01-19 DIAGNOSIS — G4700 Insomnia, unspecified: Secondary | ICD-10-CM

## 2020-01-19 DIAGNOSIS — R030 Elevated blood-pressure reading, without diagnosis of hypertension: Secondary | ICD-10-CM

## 2020-01-19 DIAGNOSIS — I10 Essential (primary) hypertension: Secondary | ICD-10-CM

## 2020-01-19 MED ORDER — LOSARTAN POTASSIUM 50 MG PO TABS: 50.0000 mg | ORAL_TABLET | Freq: Every day | ORAL | 0 refills | Status: DC

## 2020-01-19 MED ORDER — HYDROXYZINE HCL 25 MG PO TABS: 12.5000 mg | ORAL_TABLET | Freq: Every evening | ORAL | 0 refills | Status: DC | PRN

## 2020-01-19 NOTE — ED Triage Notes (Signed)
Patient states he has been dizzy for about 2 days and feels it may be just a lack of sleep. Pt is aox4 and ambulatory.

## 2020-01-19 NOTE — Discharge Instructions (Addendum)

## 2020-01-19 NOTE — ED Provider Notes (Signed)
[facility]   MRN: [id number] DOB: [birth year]  Subjective:   Phillip Mcconnell is a 39 y.o. male presenting for 2-day history of acute onset feeling dizzy and fatigue.  Patient works third shift and has had very difficult time with sleeping when he supposed to.  States that sometimes he stays awake for 24 to 48 hours because he cannot sleep during the day.  Denies any headache, vision change, weakness, numbness or tingling, belly pain, hematuria.  He does have a history of hypertension and is on amlodipine for this.  Admits that he does not eat very healthfully.  He also smokes.  No current facility-administered medications for this encounter.  Current Outpatient Medications:    amLODipine (NORVASC) 5 MG tablet, Take 1 tablet (5 mg total) by mouth daily., Disp: 90 tablet, Rfl: 3   tiZANidine (ZANAFLEX) 4 MG tablet, Take 1 tablet (4 mg total) by mouth every 6 (six) hours as needed for muscle spasms., Disp: 30 tablet, Rfl: 0   diclofenac (VOLTAREN) 50 MG EC tablet, Take 1 tablet (50 mg total) by mouth 2 (two) times daily., Disp: 30 tablet, Rfl: 0   predniSONE (STERAPRED UNI-PAK 21 TAB) 10 MG (21) TBPK tablet, Take by mouth daily. Take steroid taper as written, Disp: 21 tablet, Rfl: 0   No Known Allergies  Past Medical History:  Diagnosis Date   Chronic back pain    Hypertension      History reviewed. No pertinent surgical history.  History reviewed. No pertinent family history.  Social History   Tobacco Use   Smoking status: Current Every Day Smoker    Packs/day: 1.00    Types: Cigarettes   Smokeless tobacco: Never Used  Vaping Use   Vaping Use: Never used  Substance Use Topics   Alcohol use: Yes    Alcohol/week: 5.0 standard drinks    Types: 5 Cans of beer per week    Comment: occ   Drug use: No    ROS   Objective:   Vitals: BP (!) 155/98 (BP Location: Left Arm)    Pulse 63    Temp 97.8 F (36.6 C) (Oral)    Resp 20    SpO2 97%   BP  Readings from Last 3 Encounters:  01/19/20 (!) 155/98  12/11/19 (!) 178/113  11/12/19 (!) 131/95   Physical Exam Constitutional:      General: He is not in acute distress.    Appearance: Normal appearance. He is well-developed. He is not ill-appearing, toxic-appearing or diaphoretic.  HENT:     Head: Normocephalic and atraumatic.     Right Ear: External ear normal.     Left Ear: External ear normal.     Nose: Nose normal.     Mouth/Throat:     Mouth: Mucous membranes are moist.     Pharynx: Oropharynx is clear.  Eyes:     General: No scleral icterus.       Right eye: No discharge.        Left eye: No discharge.     Extraocular Movements: Extraocular movements intact.     Conjunctiva/sclera: Conjunctivae normal.     Pupils: Pupils are equal, round, and reactive to light.  Cardiovascular:     Rate and Rhythm: Normal rate and regular rhythm.     Heart sounds: Normal heart sounds. No murmur heard. No friction rub. No gallop.   Pulmonary:     Effort: Pulmonary effort is normal. No respiratory distress.     Breath  sounds: Normal breath sounds. No stridor. No wheezing, rhonchi or rales.  Musculoskeletal:     Right lower leg: No edema.     Left lower leg: No edema.  Skin:    General: Skin is warm and dry.  Neurological:     Mental Status: He is alert and oriented to person, place, and time.     Cranial Nerves: No cranial nerve deficit.     Motor: No weakness.     Coordination: Coordination normal.     Gait: Gait normal.     Deep Tendon Reflexes: Reflexes normal.  Psychiatric:        Mood and Affect: Mood normal.        Behavior: Behavior normal.        Thought Content: Thought content normal.        Judgment: Judgment normal.     Assessment and Plan :   PDMP not reviewed this encounter.  1. Dizziness   2. Insomnia, unspecified type   3. Essential hypertension   4. Elevated blood pressure reading    Suspect the dizziness is related to insomnia, uncontrolled  hypertension and working third shift.  Recommended considering changing to a dayshift.  Will offer him hydroxyzine for help with his sleep.  Start losartan for better blood pressure control, recommended dietary modifications. Counseled patient on potential for adverse effects with medications prescribed/recommended today, ER and return-to-clinic precautions discussed, patient verbalized understanding.    Wallis Bamberg, [name] 01/19/20 1702

## 2020-01-22 ENCOUNTER — Ambulatory Visit: Payer: Managed Care, Other (non HMO) | Admitting: Internal Medicine

## 2020-01-23 ENCOUNTER — Ambulatory Visit (INDEPENDENT_AMBULATORY_CARE_PROVIDER_SITE_OTHER): Payer: Managed Care, Other (non HMO) | Admitting: Family

## 2020-01-23 ENCOUNTER — Other Ambulatory Visit: Payer: Self-pay

## 2020-01-23 ENCOUNTER — Encounter: Payer: Self-pay | Admitting: Family

## 2020-01-23 VITALS — BP 145/95 | HR 76 | Wt 167.2 lb

## 2020-01-23 DIAGNOSIS — I10 Essential (primary) hypertension: Secondary | ICD-10-CM | POA: Diagnosis not present

## 2020-01-23 DIAGNOSIS — Z2821 Immunization not carried out because of patient refusal: Secondary | ICD-10-CM | POA: Diagnosis not present

## 2020-01-23 DIAGNOSIS — Z23 Encounter for immunization: Secondary | ICD-10-CM

## 2020-01-23 DIAGNOSIS — F5102 Adjustment insomnia: Secondary | ICD-10-CM

## 2020-01-23 MED ORDER — TRAZODONE HCL 50 MG PO TABS: 50.0000 mg | ORAL_TABLET | Freq: Every day | ORAL | 0 refills | Status: DC

## 2020-01-23 MED ORDER — AMLODIPINE BESYLATE 10 MG PO TABS: 10.0000 mg | ORAL_TABLET | Freq: Every day | ORAL | 0 refills | Status: DC

## 2020-01-23 NOTE — Patient Instructions (Addendum)
Amlodipine 10 mg daily for high blood pressure.   Follow-up in 2 weeks for blood pressure check or sooner if needed.   Hydroxyzine for insomnia.   Lab today.   Hypertension, Adult Hypertension is another name for high blood pressure. High blood pressure forces your heart to work harder to pump blood. This can cause problems over time. There are two numbers in a blood pressure reading. There is a top number (systolic) over a bottom number (diastolic). It is best to have a blood pressure that is below 120/80. Healthy choices can help lower your blood pressure, or you may need medicine to help lower it. What are the causes? The cause of this condition is not known. Some conditions may be related to high blood pressure. What increases the risk?  Smoking.  Having type 2 diabetes mellitus, high cholesterol, or both.  Not getting enough exercise or physical activity.  Being overweight.  Having too much fat, sugar, calories, or salt (sodium) in your diet.  Drinking too much alcohol.  Having long-term (chronic) kidney disease.  Having a family history of high blood pressure.  Age. Risk increases with age.  Race. You may be at higher risk if you are African American.  Gender. Men are at higher risk than women before age 68. After age 72, women are at higher risk than men.  Having obstructive sleep apnea.  Stress. What are the signs or symptoms?  High blood pressure may not cause symptoms. Very high blood pressure (hypertensive crisis) may cause: ? Headache. ? Feelings of worry or nervousness (anxiety). ? Shortness of breath. ? Nosebleed. ? A feeling of being sick to your stomach (nausea). ? Throwing up (vomiting). ? Changes in how you see. ? Very bad chest pain. ? Seizures. How is this treated?  This condition is treated by making healthy lifestyle changes, such as: ? Eating healthy foods. ? Exercising more. ? Drinking less alcohol.  Your health care provider may  prescribe medicine if lifestyle changes are not enough to get your blood pressure under control, and if: ? Your top number is above 130. ? Your bottom number is above 80.  Your personal target blood pressure may vary. Follow these instructions at home: Eating and drinking   If told, follow the DASH eating plan. To follow this plan: ? Fill one half of your plate at each meal with fruits and vegetables. ? Fill one fourth of your plate at each meal with whole grains. Whole grains include whole-wheat pasta, brown rice, and whole-grain bread. ? Eat or drink low-fat dairy products, such as skim milk or low-fat yogurt. ? Fill one fourth of your plate at each meal with low-fat (lean) proteins. Low-fat proteins include fish, chicken without skin, eggs, beans, and tofu. ? Avoid fatty meat, cured and processed meat, or chicken with skin. ? Avoid pre-made or processed food.  Eat less than 1,500 mg of salt each day.  Do not drink alcohol if: ? Your doctor tells you not to drink. ? You are pregnant, may be pregnant, or are planning to become pregnant.  If you drink alcohol: ? Limit how much you use to:  0-1 drink a day for women.  0-2 drinks a day for men. ? Be aware of how much alcohol is in your drink. In the [place] one drink equals one 12 oz bottle of beer (355 mL), one 5 oz glass of wine (148 mL), or one 1 oz glass of hard liquor (44 mL). Lifestyle  Work with your doctor to stay at a healthy weight or to lose weight. Ask your doctor what the best weight is for you.  Get at least 30 minutes of exercise most days of the week. This may include walking, swimming, or biking.  Get at least 30 minutes of exercise that strengthens your muscles (resistance exercise) at least 3 days a week. This may include lifting weights or doing Pilates.  Do not use any products that contain nicotine or tobacco, such as cigarettes, e-cigarettes, and chewing tobacco. If you need help quitting, ask your  doctor.  Check your blood pressure at home as told by your doctor.  Keep all follow-up visits as told by your doctor. This is important. Medicines  Take over-the-counter and prescription medicines only as told by your doctor. Follow directions carefully.  Do not skip doses of blood pressure medicine. The medicine does not work as well if you skip doses. Skipping doses also puts you at risk for problems.  Ask your doctor about side effects or reactions to medicines that you should watch for. Contact a doctor if you:  Think you are having a reaction to the medicine you are taking.  Have headaches that keep coming back (recurring).  Feel dizzy.  Have swelling in your ankles.  Have trouble with your vision. Get help right away if you:  Get a very bad headache.  Start to feel mixed up (confused).  Feel weak or numb.  Feel faint.  Have very bad pain in your: ? Chest. ? Belly (abdomen).  Throw up more than once.  Have trouble breathing. Summary  Hypertension is another name for high blood pressure.  High blood pressure forces your heart to work harder to pump blood.  For most people, a normal blood pressure is less than 120/80.  Making healthy choices can help lower blood pressure. If your blood pressure does not get lower with healthy choices, you may need to take medicine. This information is not intended to replace advice given to you by your health care provider. Make sure you discuss any questions you have with your health care provider. Document Revised: 09/28/2017 Document Reviewed: 09/28/2017 Elsevier Patient Education  2020 [employer].

## 2020-01-23 NOTE — Progress Notes (Signed)
Experiencing insomnia since September, sister passed 9/12

## 2020-01-23 NOTE — Progress Notes (Signed)
Patient ID: Phillip Mcconnell, male    DOB: 08-Apr-1980  MRN: [id number]  CC: Hypertension Follow-Up  Subjective: Phillip Mcconnell is a 39 y.o. male who presents for hypertension follow-up.  1. HYPERTENSION FOLLOW-UP:  06/18/2019: Visit with Dr. Earlene Plater. Newly diagnosed with hypertension during that visit and started on Amlodipine. Monitor blood pressure cuff at home. Reduction in tobacco to improve blood pressure. Return in 1 mont for blood pressure follow-up.  01/19/2020: Visit at Urgent Care at [facility]. Dizziness thought related to insomnia, uncontrolled hypertension, and working third shift. Recommended considering changing to day shift. Began on Losartan for better blood pressure control.  01/23/2020: Currently taking: see medication list Have you taken your blood pressure medication today: [x]  Yes []  No  Med Adherence: []  Yes    [x]  No still taking Amlodipine as prescribed but did not pick up Losartan from urgent care visit on 01/19/2020 for high blood pressure. Says he does not prefer to take many medications and also financial concerns for purchasing medication. Medication side effects: []  Yes    [x]  No Adherence with salt restriction: []  Yes    [x]  No Exercise: Yes []  No [x]  Smoking [x]  Yes, 6 packs per week, and did smoke prior to today's appointment SOB? []  Yes    [x]  No Chest Pain?: []  Yes    [x]  No Leg swelling?: []  Yes    [x]  No Headaches?: []  Yes    [x]  No Dizziness? [x]  Yes    []  No  2. INSOMNIA: 01/19/2020: Visit at Urgent Care at [facility]. Dizziness thought related to insomnia, uncontrolled hypertension, and working third shift. Offered Hydroxyzine to help with sleep.  01/23/2020: Did not pick up Hydroxyzine from urgent care visit on 01/19/2020 for insomnia. Says he has financial concerns for purchasing medication. Duration: months, since sister passed away in Septmeber 06/08/19  Thoughts of self-harm, suicidal ideations, homicidal ideations:  denies Satisfied with sleep quality: no Difficulty falling asleep: yes Difficulty staying asleep: yes Waking a few hours after sleep onset: yes Daytime hypersomnolence: yes Wakes feeling refreshed: no Good sleep hygiene: no Depressed/anxious mood: yes Recent stress: yes Treatments attempted: over-the-counter Tylenol PM   Patient Active Problem List   Diagnosis Date Noted  . Essential hypertension 06/18/2019     Current Outpatient Medications on File Prior to Visit  Medication Sig Dispense Refill  . diclofenac (VOLTAREN) 50 MG EC tablet Take 1 tablet (50 mg total) by mouth 2 (two) times daily. 30 tablet 0  . hydrOXYzine (ATARAX/VISTARIL) 25 MG tablet Take 0.5-1 tablets (12.5-25 mg total) by mouth at bedtime as needed (for sleep). 30 tablet 0  . losartan (COZAAR) 50 MG tablet Take 1 tablet (50 mg total) by mouth daily. 90 tablet 0  . tiZANidine (ZANAFLEX) 4 MG tablet Take 1 tablet (4 mg total) by mouth every 6 (six) hours as needed for muscle spasms. 30 tablet 0   No current facility-administered medications on file prior to visit.    No Known Allergies  Social History   Socioeconomic History  . Marital status: Married    Spouse name: Not on file  . Number of children: Not on file  . Years of education: Not on file  . Highest education level: Not on file  Occupational History  . Not on file  Tobacco Use  . Smoking status: Current Every Day Smoker    Packs/day: 1.00    Types: Cigarettes  . Smokeless tobacco: Never Used  Vaping Use  . Vaping Use:  Never used  Substance and Sexual Activity  . Alcohol use: Yes    Alcohol/week: 5.0 standard drinks    Types: 5 Cans of beer per week    Comment: occ  . Drug use: No  . Sexual activity: Yes  Other Topics Concern  . Not on file  Social History Narrative  . Not on file   Social Determinants of Health   Financial Resource Strain: Not on file  Food Insecurity: Not on file  Transportation Needs: Not on file  Physical  Activity: Not on file  Stress: Not on file  Social Connections: Not on file  Intimate Partner Violence: Not on file    History reviewed. No pertinent family history.  History reviewed. No pertinent surgical history.  ROS: Review of Systems Negative except as stated above  PHYSICAL EXAM: BP (!) 145/95 (BP Location: Left Arm, Patient Position: Sitting)   Pulse 76   Wt 167 lb 3.2 oz (75.8 kg)   SpO2 99%   BMI 26.99 kg/m   Physical Exam Constitutional:      Appearance: Normal appearance.  Eyes:     Extraocular Movements: Extraocular movements intact.     Pupils: Pupils are equal, round, and reactive to light.  Cardiovascular:     Rate and Rhythm: Normal rate and regular rhythm.     Pulses: Normal pulses.     Heart sounds: Normal heart sounds.  Pulmonary:     Effort: Pulmonary effort is normal.     Breath sounds: Normal breath sounds.  Neurological:     General: No focal deficit present.     Mental Status: He is alert and oriented to person, place, and time.  Psychiatric:        Mood and Affect: Mood is anxious.    ASSESSMENT AND PLAN: 1. Essential hypertension: - Blood pressure not at goal during today's visit. Patient asymptomatic without chest pressure, chest pain, palpitations, and shortness of breath. - Seen at the urgent care on 01/19/2020 for the same. Began on Losartan at that time. Patient has been unable  to get medication related to financial concerns. - Will increase Amlodipine from 5 mg/daily to 10 mg/daily .  - Follow-up in 2 weeks with primary physician for blood pressure check. Write down your blood pressure readings each day and bring those results along with your home blood pressure monitor to your appointment. If blood pressure not at goal at that time may consider adding low-dose Losartan for better blood pressure control. - Counseled on blood pressure goal of less than 130/80, low-sodium, DASH diet, medication compliance, 150 minutes of moderate  intensity exercise per week as tolerated. Discussed medication compliance, adverse effects. - BMP to check kidney function and electrolyte balance. Patient will return within 1 week to lab for this. - Basic Metabolic Panel; Future - amLODipine (NORVASC) 10 MG tablet; Take 1 tablet (10 mg total) by mouth daily.  Dispense: 90 tablet; Refill: 0 - Basic Metabolic Panel  2. Adjustment insomnia: - Insomnia primarily related to recent decease of his sister. Also, working third shift which may be a contributing factor. Getting about 3 hours of sleep daily for the past few months. - Seen at the urgent care on 01/19/2020 for the same. Hydroxyzine prescribed at that time. Patient has been unable to get medication related to financial concerns. - Denies thoughts of self-harm, suicidal ideations, and homicidal ideations. - Declines referral to Social Work for counseling services.  - Begin Hydroxyzine as prescribed for insomnia. Patient reports he  plans to pickup medication from pharmacy tomorrow.  - Follow-up with primary physician as needed.  3. Influenza vaccine refused: - Decline flu vaccine today.  4. Need for tetanus, diphtheria, and acellular pertussis (Tdap) vaccine: - Declines flu vaccine today.   Patient was given the opportunity to ask questions.  Patient verbalized understanding of the plan and was able to repeat key elements of the plan. Patient was given clear instructions to go to Emergency Department or return to medical center if symptoms don't improve, worsen, or new problems develop.The patient verbalized understanding.   Orders Placed This Encounter  Procedures  . Basic Metabolic Panel     Requested Prescriptions   Signed Prescriptions Disp Refills  . amLODipine (NORVASC) 10 MG tablet 90 tablet 0    Sig: Take 1 tablet (10 mg total) by mouth daily.    Return in about 2 weeks (around 02/06/2020) for Dr. Earlene Plater .  Rema Fendt, NP

## 2020-01-24 LAB — BASIC METABOLIC PANEL
BUN/Creatinine Ratio: 10 (ref 9–20)
BUN: 10 mg/dL (ref 6–20)
CO2: 20 mmol/L (ref 20–29)
Calcium: 9.6 mg/dL (ref 8.7–10.2)
Chloride: 105 mmol/L (ref 96–106)
Creatinine, Ser: 0.96 mg/dL (ref 0.76–1.27)
GFR calc Af Amer: 115 mL/min/{1.73_m2} (ref >59)
GFR calc non Af Amer: 99 mL/min/{1.73_m2} (ref >59)
Glucose: 97 mg/dL (ref 65–99)
Potassium: 4.6 mmol/L (ref 3.5–5.2)
Sodium: 142 mmol/L (ref 134–144)

## 2020-01-29 ENCOUNTER — Other Ambulatory Visit: Payer: Self-pay

## 2020-01-29 ENCOUNTER — Ambulatory Visit
Admission: EM | Admit: 2020-01-29 | Discharge: 2020-01-29 | Disposition: A | Discharge status: Home / Self Care | Payer: Managed Care, Other (non HMO) | Attending: Emergency Medicine | Admitting: Emergency Medicine

## 2020-01-29 ENCOUNTER — Encounter: Payer: Self-pay | Admitting: Emergency Medicine

## 2020-01-29 DIAGNOSIS — M25511 Pain in right shoulder: Secondary | ICD-10-CM

## 2020-01-29 MED ORDER — NAPROXEN 500 MG PO TABS: 500.0000 mg | ORAL_TABLET | Freq: Two times a day (BID) | ORAL | 0 refills | Status: DC

## 2020-01-29 NOTE — ED Triage Notes (Signed)
Pt c/o chronic rt shoulder pain with flare up yesterday. Denies injury.

## 2020-01-29 NOTE — Progress Notes (Signed)
Please call patient with update.   Kidney function normal.

## 2020-01-29 NOTE — ED Provider Notes (Signed)
[name]    CSN: [id number] Arrival date & time: 01/29/20  1604      History   Chief Complaint Chief Complaint  Patient presents with  . Shoulder Pain    HPI Phillip Mcconnell is a 39 y.o. male  With history of hypertension, sleep difficulties presenting for right shoulder pain.  Denies inciting event, injury, though does do routine heavy lifting during work.  Denies numbness or deformity distally.  No neck pain.  Past Medical History:  Diagnosis Date  . Chronic back pain   . Hypertension     Patient Active Problem List   Diagnosis Date Noted  . Essential hypertension 06/18/2019    History reviewed. No pertinent surgical history.     Home Medications    Prior to Admission medications   Medication Sig Start Date End Date Taking? Authorizing Provider  naproxen (NAPROSYN) 500 MG tablet Take 1 tablet (500 mg total) by mouth 2 (two) times daily. 01/29/20  Yes Hall-Potvin, [place], [name]  amLODipine (NORVASC) 10 MG tablet Take 1 tablet (10 mg total) by mouth daily. 01/23/20 04/22/20  Rema Fendt, NP  diclofenac (VOLTAREN) 50 MG EC tablet Take 1 tablet (50 mg total) by mouth 2 (two) times daily. 11/12/19   Wieters, Hallie C, [name]  hydrOXYzine (ATARAX/VISTARIL) 25 MG tablet Take 0.5-1 tablets (12.5-25 mg total) by mouth at bedtime as needed (for sleep). 01/19/20   Wallis Bamberg, [name]  losartan (COZAAR) 50 MG tablet Take 1 tablet (50 mg total) by mouth daily. 01/19/20   Wallis Bamberg, [name]  tiZANidine (ZANAFLEX) 4 MG tablet Take 1 tablet (4 mg total) by mouth every 6 (six) hours as needed for muscle spasms. 11/12/19   Wieters, Junius Creamer, [name]    Family History History reviewed. No pertinent family history.  Social History Social History   Tobacco Use  . Smoking status: Current Every Day Smoker    Packs/day: 1.00    Types: Cigarettes  . Smokeless tobacco: Never Used  Vaping Use  . Vaping Use: Never used  Substance Use Topics  . Alcohol use: Yes     Alcohol/week: 5.0 standard drinks    Types: 5 Cans of beer per week    Comment: occ  . Drug use: No     Allergies   Patient has no known allergies.   Review of Systems Review of Systems  Constitutional: Negative for fatigue and fever.  Respiratory: Negative for cough and shortness of breath.   Cardiovascular: Negative for chest pain and palpitations.  Gastrointestinal: Negative for abdominal pain, diarrhea and vomiting.  Musculoskeletal: Negative for arthralgias and myalgias.       (+) Right shoulder pain  Skin: Negative for rash and wound.  Neurological: Negative for speech difficulty and headaches.  All other systems reviewed and are negative.    Physical Exam Triage Vital Signs ED Triage Vitals  Enc Vitals Group     BP      Pulse      Resp      Temp      Temp src      SpO2      Weight      Height      Head Circumference      Peak Flow      Pain Score      Pain Loc      Pain Edu?      Excl. in GC?    No data found.  Updated Vital Signs There were  no vitals taken for this visit.  Visual Acuity Right Eye Distance:   Left Eye Distance:   Bilateral Distance:    Right Eye Near:   Left Eye Near:    Bilateral Near:     Physical Exam Constitutional:      General: He is not in acute distress. HENT:     Head: Normocephalic and atraumatic.  Eyes:     General: No scleral icterus.    Pupils: Pupils are equal, round, and reactive to light.  Cardiovascular:     Rate and Rhythm: Normal rate.  Pulmonary:     Effort: Pulmonary effort is normal. No respiratory distress.     Breath sounds: No wheezing.  Musculoskeletal:        General: Tenderness present. No swelling or deformity. Normal range of motion.     Comments: Right proximal bicep and AC joint tenderness without swelling, crepitus, deformity.  Otherwise unremarkable.  Distal extremity unremarkable.  Skin:    Capillary Refill: Capillary refill takes less than 2 seconds.     Coloration: Skin is not  jaundiced or pale.  Neurological:     General: No focal deficit present.     Mental Status: He is alert and oriented to person, place, and time.      UC Treatments / Results  Labs (all labs ordered are listed, but only abnormal results are displayed) Labs Reviewed - No data to display  EKG   Radiology No results found.  Procedures Procedures (including critical care time)  Medications Ordered in UC Medications - No data to display  Initial Impression / Assessment and Plan / UC Course  I have reviewed the triage vital signs and the nursing notes.  Pertinent labs & imaging results that were available during my care of the patient were reviewed by me and considered in my medical decision making (see chart for details).     Exam benign at this time: Likely acute on chronic second to prolonged use given work.  Patient also notes persistent sleep disturbances: Has appointment with PCP on 1/7 to discuss this further.  Encouraged him to do so.  Return precautions discussed, pt verbalized understanding and is agreeable to plan. Final Clinical Impressions(s) / UC Diagnoses   Final diagnoses:  Acute pain of right shoulder     Discharge Instructions     Talk with PCP about sleep study and medicine as well as sleep causing work issues.    ED Prescriptions    Medication Sig Dispense Auth. Provider   naproxen (NAPROSYN) 500 MG tablet Take 1 tablet (500 mg total) by mouth 2 (two) times daily. 30 tablet Hall-Potvin, [place], [name]     PDMP not reviewed this encounter.   Odette Fraction [place], [place] 01/29/20 1907

## 2020-01-29 NOTE — Discharge Instructions (Addendum)
Talk with PCP about sleep study and medicine as well as sleep causing work issues.

## 2020-02-07 ENCOUNTER — Telehealth: Payer: Self-pay

## 2020-02-07 NOTE — Telephone Encounter (Signed)
ATC for appt reminder/screening questions, [facility], no VM available

## 2020-02-08 ENCOUNTER — Other Ambulatory Visit: Payer: Self-pay

## 2020-02-08 ENCOUNTER — Ambulatory Visit (INDEPENDENT_AMBULATORY_CARE_PROVIDER_SITE_OTHER): Payer: Managed Care, Other (non HMO) | Admitting: Family

## 2020-02-08 ENCOUNTER — Encounter: Payer: Self-pay | Admitting: Family

## 2020-02-08 VITALS — BP 161/94 | HR 92 | Temp 97.5°F | Resp 18 | Ht 65.0 in | Wt 167.0 lb

## 2020-02-08 DIAGNOSIS — F32A Depression, unspecified: Secondary | ICD-10-CM | POA: Diagnosis not present

## 2020-02-08 DIAGNOSIS — F411 Generalized anxiety disorder: Secondary | ICD-10-CM

## 2020-02-08 DIAGNOSIS — I10 Essential (primary) hypertension: Secondary | ICD-10-CM

## 2020-02-08 DIAGNOSIS — F5102 Adjustment insomnia: Secondary | ICD-10-CM | POA: Diagnosis not present

## 2020-02-08 MED ORDER — LOSARTAN POTASSIUM 25 MG PO TABS: 25.0000 mg | ORAL_TABLET | Freq: Every day | ORAL | 0 refills | Status: DC

## 2020-02-08 MED ORDER — TRAZODONE HCL 50 MG PO TABS: 50.0000 mg | ORAL_TABLET | Freq: Every day | ORAL | 0 refills | Status: DC

## 2020-02-08 NOTE — Patient Instructions (Signed)
Continue Amlodipine for high blood pressure.   Begin Losartan for high blood pressure. Follow-up with in 2 weeks with clinical pharmacist for blood pressure check. Appointment at [address] [place] [address].  Referral to Psychiatry and Social Work for counseling services. Hypertension, Adult Hypertension is another name for high blood pressure. High blood pressure forces your heart to work harder to pump blood. This can cause problems over time. There are two numbers in a blood pressure reading. There is a top number (systolic) over a bottom number (diastolic). It is best to have a blood pressure that is below 120/80. Healthy choices can help lower your blood pressure, or you may need medicine to help lower it. What are the causes? The cause of this condition is not known. Some conditions may be related to high blood pressure. What increases the risk?  Smoking.  Having type 2 diabetes mellitus, high cholesterol, or both.  Not getting enough exercise or physical activity.  Being overweight.  Having too much fat, sugar, calories, or salt (sodium) in your diet.  Drinking too much alcohol.  Having long-term (chronic) kidney disease.  Having a family history of high blood pressure.  Age. Risk increases with age.  Race. You may be at higher risk if you are African American.  Gender. Men are at higher risk than women before age 29. After age 60, women are at higher risk than men.  Having obstructive sleep apnea.  Stress. What are the signs or symptoms?  High blood pressure may not cause symptoms. Very high blood pressure (hypertensive crisis) may cause: ? Headache. ? Feelings of worry or nervousness (anxiety). ? Shortness of breath. ? Nosebleed. ? A feeling of being sick to your stomach (nausea). ? Throwing up (vomiting). ? Changes in how you see. ? Very bad chest pain. ? Seizures. How is this treated?  This condition is treated by making healthy lifestyle  changes, such as: ? Eating healthy foods. ? Exercising more. ? Drinking less alcohol.  Your health care provider may prescribe medicine if lifestyle changes are not enough to get your blood pressure under control, and if: ? Your top number is above 130. ? Your bottom number is above 80.  Your personal target blood pressure may vary. Follow these instructions at home: Eating and drinking   If told, follow the DASH eating plan. To follow this plan: ? Fill one half of your plate at each meal with fruits and vegetables. ? Fill one fourth of your plate at each meal with whole grains. Whole grains include whole-wheat pasta, brown rice, and whole-grain bread. ? Eat or drink low-fat dairy products, such as skim milk or low-fat yogurt. ? Fill one fourth of your plate at each meal with low-fat (lean) proteins. Low-fat proteins include fish, chicken without skin, eggs, beans, and tofu. ? Avoid fatty meat, cured and processed meat, or chicken with skin. ? Avoid pre-made or processed food.  Eat less than 1,500 mg of salt each day.  Do not drink alcohol if: ? Your doctor tells you not to drink. ? You are pregnant, may be pregnant, or are planning to become pregnant.  If you drink alcohol: ? Limit how much you use to:  0-1 drink a day for women.  0-2 drinks a day for men. ? Be aware of how much alcohol is in your drink. In the [place] one drink equals one 12 oz bottle of beer (355 mL), one 5 oz glass of wine (148 mL), or  one 1 oz glass of hard liquor (44 mL). Lifestyle   Work with your doctor to stay at a healthy weight or to lose weight. Ask your doctor what the best weight is for you.  Get at least 30 minutes of exercise most days of the week. This may include walking, swimming, or biking.  Get at least 30 minutes of exercise that strengthens your muscles (resistance exercise) at least 3 days a week. This may include lifting weights or doing Pilates.  Do not use any products that  contain nicotine or tobacco, such as cigarettes, e-cigarettes, and chewing tobacco. If you need help quitting, ask your doctor.  Check your blood pressure at home as told by your doctor.  Keep all follow-up visits as told by your doctor. This is important. Medicines  Take over-the-counter and prescription medicines only as told by your doctor. Follow directions carefully.  Do not skip doses of blood pressure medicine. The medicine does not work as well if you skip doses. Skipping doses also puts you at risk for problems.  Ask your doctor about side effects or reactions to medicines that you should watch for. Contact a doctor if you:  Think you are having a reaction to the medicine you are taking.  Have headaches that keep coming back (recurring).  Feel dizzy.  Have swelling in your ankles.  Have trouble with your vision. Get help right away if you:  Get a very bad headache.  Start to feel mixed up (confused).  Feel weak or numb.  Feel faint.  Have very bad pain in your: ? Chest. ? Belly (abdomen).  Throw up more than once.  Have trouble breathing. Summary  Hypertension is another name for high blood pressure.  High blood pressure forces your heart to work harder to pump blood.  For most people, a normal blood pressure is less than 120/80.  Making healthy choices can help lower blood pressure. If your blood pressure does not get lower with healthy choices, you may need to take medicine. This information is not intended to replace advice given to you by your health care provider. Make sure you discuss any questions you have with your health care provider. Document Revised: 09/28/2017 Document Reviewed: 09/28/2017 Elsevier Patient Education  2020 [employer].

## 2020-02-08 NOTE — Progress Notes (Unsigned)
Patient ID: [id number] MCKELVIE, male    DOB: 07-22-1980  MRN: [id number]  CC: Hypertension Follow-Up  Subjective: Phillip Mcconnell is a 40 y.o. male who presents for hypertension follow-up.  1. HYPERTENSION FOLLOW-UP: 01/23/2020: Amlodipine increased to 10 mg daily.   02/08/2020: Currently taking: see medication list Have you taken your blood pressure medication today: [x]  Yes []  No  Med Adherence: [x]  Yes    []  No Medication side effects: []  Yes    [x]  No Adherence with salt restriction: []  Yes    [x]  No Exercise: Yes []  No [x]  Smoking [x]  Yes []  No  Patient Active Problem List   Diagnosis Date Noted  . Essential hypertension 06/18/2019     Current Outpatient Medications on File Prior to Visit  Medication Sig Dispense Refill  . amLODipine (NORVASC) 10 MG tablet Take 1 tablet (10 mg total) by mouth daily. 90 tablet 0  . diclofenac (VOLTAREN) 50 MG EC tablet Take 1 tablet (50 mg total) by mouth 2 (two) times daily. (Patient not taking: Reported on 02/08/2020) 30 tablet 0  . naproxen (NAPROSYN) 500 MG tablet Take 1 tablet (500 mg total) by mouth 2 (two) times daily. (Patient not taking: Reported on 02/08/2020) 30 tablet 0  . tiZANidine (ZANAFLEX) 4 MG tablet Take 1 tablet (4 mg total) by mouth every 6 (six) hours as needed for muscle spasms. (Patient not taking: Reported on 02/08/2020) 30 tablet 0   No current facility-administered medications on file prior to visit.    No Known Allergies  Social History   Socioeconomic History  . Marital status: Married    Spouse name: Not on file  . Number of children: Not on file  . Years of education: Not on file  . Highest education level: Not on file  Occupational History  . Not on file  Tobacco Use  . Smoking status: Current Every Day Smoker    Packs/day: 1.00    Types: Cigarettes  . Smokeless tobacco: Never Used  Vaping Use  . Vaping Use: Never used  Substance and Sexual Activity  . Alcohol use: Yes    Alcohol/week: 5.0  standard drinks    Types: 5 Cans of beer per week    Comment: occ  . Drug use: No  . Sexual activity: Yes  Other Topics Concern  . Not on file  Social History Narrative  . Not on file   Social Determinants of Health   Financial Resource Strain: Not on file  Food Insecurity: Not on file  Transportation Needs: Not on file  Physical Activity: Not on file  Stress: Not on file  Social Connections: Not on file  Intimate Partner Violence: Not on file    No family history on file.  No past surgical history on file.  ROS: Review of Systems Negative except as stated above  PHYSICAL EXAM: BP (!) 161/94 (BP Location: Right Arm, Patient Position: Sitting, Cuff Size: Normal)   Pulse 92   Temp (!) 97.5 F (36.4 C) (Temporal)   Resp 18   Ht 5\' 5"  (1.651 m)   Wt 167 lb (75.8 kg)   SpO2 97%   BMI 27.79 kg/m   Physical Exam HENT:     Head: Normocephalic.  Eyes:     Extraocular Movements: Extraocular movements intact.     Pupils: Pupils are equal, round, and reactive to light.  Cardiovascular:     Rate and Rhythm: Normal rate and regular rhythm.     Pulses: Normal pulses.  Heart sounds: Normal heart sounds.  Pulmonary:     Effort: Pulmonary effort is normal.  Musculoskeletal:     Cervical back: Normal range of motion and neck supple.  Neurological:     General: No focal deficit present.     Mental Status: He is alert and oriented to person, place, and time.  Psychiatric:        Mood and Affect: Mood is anxious and depressed.    ASSESSMENT AND PLAN: 1. Essential hypertension: - Blood pressure not at goal during today's visit. Patient asymptomatic without chest pressure, chest pain, palpitations, and shortness of breath. - Endorses taking Amlodipine daily without missing doses.  - Continue Amlodipine as prescribed. - Begin Losartan as prescribed.  - Follow-up with in 2 weeks with clinical pharmacist for blood pressure check. Write down your blood pressure readings  each day and bring those results along with your home blood pressure monitor to your appointment. Medications may be adjusted at that time if needed. - Counseled on blood pressure goal of less than 130/80, low-sodium, DASH diet, medication compliance, 150 minutes of moderate intensity exercise per week as tolerated. Discussed medication compliance, adverse effects. - Last BMP 01/23/2020. - Keep all scheduled appointments with primary physician. - losartan (COZAAR) 25 MG tablet; Take 1 tablet (25 mg total) by mouth daily.  Dispense: 30 tablet; Refill: 0  2. Anxiety state: 3. Depression, unspecified depression type: 4. Adjustment insomnia: - Reports still experiencing anxiety related to the decease of his sister since last visit. Also, anxiety related to home and work life balance. - Declines anti-anxiety/anti-depression medication at this time. - Denies thoughts of self-harm, suicidal ideations, and homicidal ideations. - Continue Trazodone as prescribed for insomnia.  - Referral to Social Work for counseling and community resources.  - Referral to Psychiatry for further evaluation and management. - Follow-up with primary physician as needed. - Ambulatory referral to Social Work - Ambulatory referral to Psychiatry  - traZODone (DESYREL) 50 MG tablet; Take 1 tablet (50 mg total) by mouth at bedtime.  Dispense: 90 tablet; Refill: 0   Patient was given the opportunity to ask questions.  Patient verbalized understanding of the plan and was able to repeat key elements of the plan. Patient was given clear instructions to go to Emergency Department or return to medical center if symptoms don't improve, worsen, or new problems develop.The patient verbalized understanding.   Orders Placed This Encounter  Procedures  . Ambulatory referral to Social Work  . Ambulatory referral to Psychiatry     Requested Prescriptions   Signed Prescriptions Disp Refills  . losartan (COZAAR) 25 MG tablet 30  tablet 0    Sig: Take 1 tablet (25 mg total) by mouth daily.  . traZODone (DESYREL) 50 MG tablet 90 tablet 0    Sig: Take 1 tablet (50 mg total) by mouth at bedtime.    Return in about 2 weeks (around 02/22/2020) for clinical pharmacist [facility].  Rema Fendt, NP

## 2020-02-14 ENCOUNTER — Telehealth: Payer: Self-pay

## 2020-02-14 NOTE — Telephone Encounter (Signed)
Patient contacted office looking to speak with PCP about paperwork for employer.

## 2020-02-14 NOTE — Telephone Encounter (Signed)
Will follow-up w/ pt to get further details

## 2020-02-15 NOTE — Telephone Encounter (Signed)
Pt to bring paperwork into clinic today, aware provider may recommend an appt prior to completion of paperwork

## 2020-02-19 NOTE — Telephone Encounter (Signed)
This is probably better addressed by Ricky Stabs as she has seen the patient the past two times and been managing some of these concerns he brought up in the MyChart message.   Marcy Siren, D.O. Primary Care at [facility]  02/19/2020, 2:58 PM

## 2020-02-20 ENCOUNTER — Telehealth: Payer: Self-pay

## 2020-02-20 ENCOUNTER — Other Ambulatory Visit: Payer: Self-pay | Admitting: Family

## 2020-02-20 MED ORDER — TRAZODONE HCL 50 MG PO TABS: 50.0000 mg | ORAL_TABLET | Freq: Every day | ORAL | 0 refills | Status: DC

## 2020-02-20 MED ORDER — AMLODIPINE BESYLATE 10 MG PO TABS: 10.0000 mg | ORAL_TABLET | Freq: Every day | ORAL | 0 refills | Status: DC

## 2020-02-20 MED ORDER — LOSARTAN POTASSIUM 25 MG PO TABS: 25.0000 mg | ORAL_TABLET | Freq: Every day | ORAL | 0 refills | Status: DC

## 2020-02-20 MED FILL — traZODone HCL 50 MG TABS: 50 | 30 days supply | Qty: 30 | Fill #0

## 2020-02-20 MED FILL — AMLODIPINE BESYLATE 10 MG T: 10 | 30 days supply | Qty: 30 | Fill #0

## 2020-02-20 MED FILL — LOSARTAN POTASSIUM 25 MG TA: 25 | 30 days supply | Qty: 30 | Fill #0

## 2020-02-20 NOTE — Telephone Encounter (Signed)
MyChart message of provider's instructions generated and sent to pt per pt's request

## 2020-02-20 NOTE — Addendum Note (Signed)
Addended by: Rema Fendt on: 02/20/2020 12:31 PM   Modules accepted: Orders

## 2020-02-20 NOTE — Telephone Encounter (Signed)
Please call patient with update.    I will be unable to provide a letter for absence from work since January 19, 2020 through the present time as this was not part of the patient's plan of care.   I will be able to provide a work note for the dates which patient was seen in clinic  being 01/23/2020 and 02/08/2020.  Please keep appointment with social worker Jenel Lucks scheduled for 02/21/2020. I have notified her of the urgent assistance needed with bills and housing. She may also be able to connect patient with resources in the community for assistance and offer counseling services.  Patient referred to Psychiatry for anxiety and depression on 02/08/2020. No appointment scheduled as of yet. I will ask the referral coordinator Cheryll Dessert if it's possible to schedule an appointment soon.  Patient declined anxiety and depression medication initiation on 02/08/2020.   If patient would like a second opinion in regards to his anxiety and depression management or if he would like consider beginning medication therapy he may schedule an appointment with his primary physician or any provider in the office.  If patient is having any thoughts of self-harm, suicidal ideations, and/or homicidal ideations he should immediately seek medical attention at the Emergency Department.  Patient may apply for the [facility] financial discount/orange card/blue card to assist with coverage of healthcare and medications. Please schedule patient an appointment with financial counselor Jenene Slicker while the patient is on the phone.  Please keep appointment with clinical pharmacist [facility] scheduled on 02/22/2020 for blood pressure check.

## 2020-02-20 NOTE — Telephone Encounter (Signed)
Two work letters generated for 01/23/2020 and 02/08/2020. Sent to patient via MyChart.

## 2020-02-20 NOTE — Telephone Encounter (Signed)
Please call patient with update.    I was able to confirm that the initial referral to Psychiatry requested on 02/08/2020 was declined at the [facility] office is no longer accepting patients at this time.   A new referral placed today to Psychiatry. Request for [facility] located at [address] [place] [address] phone [phone]. Please inform patients that this particular office accepts walk-ins as well.   Medications Amlodipine, Losartan, and Trazodone transferred to [facility] at [address] [place] [address]. The pharmacy located there may be able to offer financial assistance with medications.

## 2020-02-21 ENCOUNTER — Ambulatory Visit (INDEPENDENT_AMBULATORY_CARE_PROVIDER_SITE_OTHER): Payer: Managed Care, Other (non HMO) | Admitting: Licensed Clinical Social Worker

## 2020-02-21 ENCOUNTER — Other Ambulatory Visit: Payer: Self-pay

## 2020-02-21 DIAGNOSIS — F4323 Adjustment disorder with mixed anxiety and depressed mood: Secondary | ICD-10-CM | POA: Diagnosis not present

## 2020-02-21 DIAGNOSIS — Z599 Problem related to housing and economic circumstances, unspecified: Secondary | ICD-10-CM

## 2020-02-22 ENCOUNTER — Ambulatory Visit: Payer: Managed Care, Other (non HMO) | Attending: Internal Medicine | Admitting: Pharmacist

## 2020-02-22 ENCOUNTER — Other Ambulatory Visit: Payer: Self-pay

## 2020-02-22 ENCOUNTER — Encounter: Payer: Self-pay | Admitting: Pharmacist

## 2020-02-22 VITALS — BP 145/82

## 2020-02-22 DIAGNOSIS — I10 Essential (primary) hypertension: Secondary | ICD-10-CM

## 2020-02-22 NOTE — Progress Notes (Signed)
   S:    Patient arrives in good spirits. Presents to the clinic for hypertension evaluation, counseling, and management. Patient was referred and last seen by Ricky Stabs on 02/08/2020.   Medication adherence reported. He took them this morning.   Current BP Medications include:  Amlodipine 10 mg daily, losartan 25 mg daily   Dietary habits include: tries to limit salt; denies drinking caffeine  Exercise habits include: none Family / Social history:  - FHx: no pertinent positives  - Tobacco: has decreased to 5 cigarettes a day  - Alcohol: stopped drinking alcohol 1.5 yrs ago   O:  Vitals:   02/22/20 0945  BP: (!) 145/82  HR 82  Home BP readings: none   Last 3 Office BP readings: BP Readings from Last 3 Encounters:  02/22/20 (!) 145/82  02/08/20 (!) 161/94  01/29/20 (!) 158/110   BMET    Component Value Date/Time   NA 142 01/23/2020 1031   K 4.6 01/23/2020 1031   CL 105 01/23/2020 1031   CO2 20 01/23/2020 1031   GLUCOSE 97 01/23/2020 1031   GLUCOSE 88 04/02/2017 2137   BUN 10 01/23/2020 1031   CREATININE 0.96 01/23/2020 1031   CALCIUM 9.6 01/23/2020 1031   GFRNONAA 99 01/23/2020 1031   GFRAA 115 01/23/2020 1031    Renal function: CrCl cannot be calculated (Patient's most recent lab result is older than the maximum 21 days allowed.).  Clinical ASCVD: No  The ASCVD Risk score Denman George DC Jr., et al., 2013) failed to calculate for the following reasons:   Cannot find a previous HDL lab   Cannot find a previous total cholesterol lab  A/P: Hypertension longstanding currently uncontrolled but improving on current medications. BP Goal = < 130/80 mmHg. Medication adherence reported. He has only been taking his medications for 1 day. Will see him in a month for reassessment. No changes today.   -Continued current regimen.  -Counseled on lifestyle modifications for blood pressure control including reduced dietary sodium, increased exercise, adequate sleep.  Results  reviewed and written information provided.   Total time in face-to-face counseling 15 minutes.   F/U Clinic Visit in 1 month.    Patient seen with:  Adam Phenix, PharmD Candidate  [employer]  Butch Penny, PharmD, [place], CPP Clinical Pharmacist [facility] [phone]

## 2020-02-27 NOTE — BH Specialist Note (Signed)
Integrated Behavioral Health Initial In-Person Visit  MRN: [id number] Name: Phillip Mcconnell  Number of Integrated Behavioral Health Clinician visits:: 1/6 Session Start time: 9:00 AM  Session End time: 9:35 AM Total time: 35  minutes  Types of Service: Individual psychotherapy  Interpretor:No. Interpretor Name and Language: NA   Subjective: Phillip Mcconnell is a 40 y.o. male  Patient was referred by NP Zonia Kief for psychosocial stressors. Patient reports the following symptoms/concerns: Pt reports difficulty managing increase in stress and chronic medical conditions triggered by financial strain. Pt reports that he has not been able to work since December 16, 21 due to ongoing panic attacks and difficulty obtaining sleep. He has visited the  ED twice since that time, in addition, to completing a visit with NP Zonia Kief on 02/08/20 Duration of problem: December 2021; Severity of problem: moderate  Objective: Mood: Anxious and Affect: Depressed Risk of harm to self or others: No plan to harm self or others  Life Context: Family and Social: Pt receives support from family School/Work: Pt is employed; however, reports inability to conduct job duties due to increase in anxiety symptoms Self-Care: Pt is experiencing financial strain making it difficult to obtain prescribed medications to manage conditions Life Changes: Pt is experiencing financial strain  Patient and/or Family's Strengths/Protective Factors: Social connections and Social and Emotional competence  Goals Addressed: Patient will: 1. Reduce symptoms of: anxiety, insomnia and stress Pt agreed to comply with medication management to assist with sleep and HTN 2. Demonstrate ability to: Increase healthy adjustment to current life circumstances and Increase adequate support systems for patient/family Pt provided verbal con  Progress towards Goals: Ongoing  Interventions: Interventions utilized: Solution-Focused  Strategies  Standardized Assessments completed: GAD-7 and PHQ 2&9  Patient Response: Pt was engaged during session and was successful in identifying needs to assist with managing symptoms  Patient Centered Plan: Patient is on the following Treatment Plan(s):  Anxiety, Depression, Financial Difficulties  Assessment: Patient currently experiencing difficulty managing depression and anxiety symptoms triggered by psychosocial stressors. He denies SI/HI; however, states difficulty obtaining sleep. Pt reports that he is at risk of eviction due to financial strain noting pt has not returned to work since onset of symptoms in December 2021.   Patient may benefit from supportive resources. LCSW discussed with pt resources to obtain medications ([facility]) CPP was consulted and informed LCSW that pt's medications are ready at [facility] for approximately $12 co-pay, which pt was able to afford. Pt agreed to pick up medications same week.   Pt verbalized consent for LCSW to complete Legal Aid referral to assist with eviction, in addition, to a referral to the Eviction Mediation program through [employer]. Per pt request, LCSW will message PCP regarding letter stating that pt has been receiving medical care to address reported symptoms, which has impacted pt's ability to work. Pt shared letter will be utilized to assist employer with providing emergency funding for rent/utilities.   Plan: 1. Follow up with behavioral health clinician on : Contact LCSW with any additional behavioral health and/or resource needs 2. Behavioral recommendations: Utilize strategies discussed, resources provided, pick up prescribed medications, and complete visit with CPP 3. Referral(s): Integrated [employer] (In Clinic) and [employer] [facility]:  Finances and Housing 4. "From scale of 1-10, how likely are you to follow plan?":   Bridgett Larsson, LCSW 02/27/2020 3:37 PM

## 2020-03-06 ENCOUNTER — Ambulatory Visit (INDEPENDENT_AMBULATORY_CARE_PROVIDER_SITE_OTHER): Payer: Managed Care, Other (non HMO) | Admitting: Licensed Clinical Social Worker

## 2020-03-06 DIAGNOSIS — F4323 Adjustment disorder with mixed anxiety and depressed mood: Secondary | ICD-10-CM | POA: Diagnosis not present

## 2020-03-13 NOTE — BH Specialist Note (Signed)
Integrated Behavioral Health via Telemedicine Visit  03/06/20 Phillip Mcconnell [id number]  Number of Integrated Behavioral Health visits: 2 Session Start time: 9:10 AM  Session End time: 9:30 AM Total time: 20  Referring Provider: NP Zonia Kief Patient/Family location: Home [facility] Provider location: Office All persons participating in visit: LCSW and Patient Types of Service: Individual psychotherapy  I connected with Phillip Mcconnell by Telephone  (Video is Caregility application) and verified that I am speaking with the correct person using two identifiers.Discussed confidentiality: Yes   I discussed the limitations of telemedicine and the availability of in person appointments.  Discussed there is a possibility of technology failure and discussed alternative modes of communication if that failure occurs.  I discussed that engaging in this telemedicine visit, they consent to the provision of behavioral healthcare and the services will be billed under their insurance.  Patient and/or legal guardian expressed understanding and consented to Telemedicine visit: Yes   Presenting Concerns: Patient and/or family reports the following symptoms/concerns: Pt reports ongoing difficulty managing anxiety symptoms. Pt is taking prescribed medication to assist with sleep; however, limited success with obtaining quality sleep. States he wakes up every 4 hours. Pt has not returned to work Duration of problem: December 2021; Severity of problem: severe  Patient and/or Family's Strengths/Protective Factors: Social connections, Social and Emotional competence, Concrete supports in place (healthy food, safe environments, etc.) and Sense of purpose  Goals Addressed: Patient will: 1.  Reduce symptoms of: anxiety Pt agreed to continue compliance with medications 2.  Increase knowledge and/or ability of: coping skills Pt agreed to utilize sleep hygiene strategies discussed 3.  Demonstrate ability to:  Increase healthy adjustment to current life circumstances and Increase adequate support systems for patient/family Pt agreed to visit [facility] to initiate therapy/psychiatry to assist with management of symptoms  Progress towards Goals: Ongoing  Interventions: Interventions utilized:  Solution-Focused Strategies and Sleep Hygiene Standardized Assessments completed: Not Needed  Patient Response: Pt was engaged during session and was open to strategies to assist with anxiety  Assessment: Patient currently experiencing difficulty managing anxiety symptoms. Pt is unable to obtain quality sleep resulting in increase of symptoms.    Patient may benefit from continued therapy and medication management. Sleep hygiene strategies were discussed and pt agreed to initiate services through [facility] as a walk-in.  Plan: 1. Follow up with behavioral health clinician on : 03/20/20 2. Behavioral recommendations: Utilize strategies discussed, comply with meds, and follow up with [facility] 3. Referral(s): [employer] [facility] (LME/Outside Clinic)  I discussed the assessment and treatment plan with the patient and/or parent/guardian. They were provided an opportunity to ask questions and all were answered. They agreed with the plan and demonstrated an understanding of the instructions.   They were advised to call back or seek an in-person evaluation if the symptoms worsen or if the condition fails to improve as anticipated.  Bridgett Larsson, LCSW  03/13/20 10:54 AM

## 2020-03-20 ENCOUNTER — Other Ambulatory Visit: Payer: Self-pay

## 2020-03-20 ENCOUNTER — Ambulatory Visit (INDEPENDENT_AMBULATORY_CARE_PROVIDER_SITE_OTHER): Payer: Managed Care, Other (non HMO) | Admitting: Licensed Clinical Social Worker

## 2020-03-20 DIAGNOSIS — F411 Generalized anxiety disorder: Secondary | ICD-10-CM

## 2020-03-20 NOTE — BH Specialist Note (Signed)
Integrated Behavioral Health via Telemedicine Visit  03/20/2020 Phillip Mcconnell [id number]  Number of Integrated Behavioral Health visits: 1 Session Start time: 10:05 AM  Session End time: 10:25 AM Total time: 20  Referring Provider: NP Zonia Kief Patient/Family location: [facility] Provider location: Office All persons participating in visit: LCSW, Patient, Patient's spouse Types of Service: Individual psychotherapy  I connected with Phillip Mcconnell by Telephone  (Video is Caregility application) and verified that I am speaking with the correct person using two identifiers.Discussed confidentiality: Yes   I discussed the limitations of telemedicine and the availability of in person appointments.  Discussed there is a possibility of technology failure and discussed alternative modes of communication if that failure occurs.  I discussed that engaging in this telemedicine visit, they consent to the provision of behavioral healthcare and the services will be billed under their insurance.  Patient and/or legal guardian expressed understanding and consented to Telemedicine visit: Yes   Presenting Concerns: Patient and/or family reports the following symptoms/concerns: Pt reports that symptoms "are about the same" He continues to have difficulty obtaining quality sleep, despite using prescribed medication to assist. He shared that he recently experienced a panic attack while running errands without family. Increase in irritability Pt was out of work due to a back injury last year which resulted in financial strain Duration of problem: Ongoing; Severity of problem: severe  Patient and/or Family's Strengths/Protective Factors: Social connections, Social and Emotional competence, Concrete supports in place (healthy food, safe environments, etc.) and Sense of purpose Spouse keeps pt encouraged  Goals Addressed: Patient will: 1.  Reduce symptoms of: anxiety Pt agreed to initiate medications to  assist with management of symptoms 2.  Increase knowledge and/or ability of: coping skills Pt agreed to continue talking with spouse to promote relaxation  Progress towards Goals: Ongoing  Interventions: Interventions utilized:  Solution-Focused Strategies, Supportive Counseling and Psychoeducation and/or Health Education Standardized Assessments completed: Not Needed  Patient and/or Family Response: Pt was engaged during session and is interested in initiating medication to assist with management of symptoms. Pt is also requesting refills on amlodipine and losartan  Assessment: Patient currently experiencing difficulty managing depression and anxiety symptoms. Pt endorses increase in irritability with continued difficulty obtaining sleep.    Patient may benefit from medication management. LCSW will message PCP about pt's request to initiate medications to assist with management of symptoms.   Plan: 1. Follow up with behavioral health clinician on : Contact LCSW with any additional behavioral health and/or resource needs 2. Behavioral recommendations: Utilize strategies discussed 3. Referral(s): Integrated [employer] (In Clinic)  I discussed the assessment and treatment plan with the patient and/or parent/guardian. They were provided an opportunity to ask questions and all were answered. They agreed with the plan and demonstrated an understanding of the instructions.   They were advised to call back or seek an in-person evaluation if the symptoms worsen or if the condition fails to improve as anticipated.  Bridgett Larsson, LCSW  03/20/20 1:56 PM

## 2020-03-24 ENCOUNTER — Other Ambulatory Visit: Payer: Self-pay | Admitting: Internal Medicine

## 2020-03-24 ENCOUNTER — Ambulatory Visit: Payer: Managed Care, Other (non HMO) | Attending: Internal Medicine | Admitting: Pharmacist

## 2020-03-24 ENCOUNTER — Other Ambulatory Visit: Payer: Self-pay

## 2020-03-24 ENCOUNTER — Encounter: Payer: Self-pay | Admitting: Pharmacist

## 2020-03-24 DIAGNOSIS — I10 Essential (primary) hypertension: Secondary | ICD-10-CM

## 2020-03-24 MED ORDER — AMLODIPINE BESYLATE 10 MG PO TABS: 10.0000 mg | ORAL_TABLET | Freq: Every day | ORAL | 2 refills | Status: DC

## 2020-03-24 MED ORDER — LOSARTAN POTASSIUM 25 MG PO TABS: 25.0000 mg | ORAL_TABLET | Freq: Every day | ORAL | 2 refills | Status: DC

## 2020-03-24 MED FILL — AMLODIPINE BESYLATE 10 MG T: 10 | 30 days supply | Qty: 30 | Fill #0

## 2020-03-24 MED FILL — LOSARTAN POTASSIUM 25 MG TA: 25 | 30 days supply | Qty: 30 | Fill #0

## 2020-03-24 NOTE — Progress Notes (Signed)
   PCP: Dr. Earlene Plater  S:    Patient arrives in good spirits. Presents to the clinic for hypertension evaluation, counseling, and management. Patient was referred and last seen by Ricky Stabs on 02/08/2020. Last saw CPP on 02/22/2020 and no changes were made.  Medication adherence reported, however he ran out of antihypertensives last Saturday 2/19. He denies HA, SOB, chest pain, lightheadedness, and blurred vision. Of note, he reports persistent anxiety, panic attacks, insomnia, and some weight loss.   Current BP Medications include:  Amlodipine 10 mg daily, losartan 25 mg daily   Dietary habits include: tries to limit salt; drinks 1-2 cups of coffee daily, denies soda Exercise habits include: none Family / Social history:  - FHx: no pertinent positives  - Tobacco: has decreased to 5 cigarettes a day  - Alcohol: last drank alcohol 01/2020  O:  Vitals:   03/24/20 1039  BP: 138/82  Pulse: 78   Home BP readings: none   Last 3 Office BP readings: BP Readings from Last 3 Encounters:  03/24/20 138/82  02/22/20 (!) 145/82  02/08/20 (!) 161/94   BMET    Component Value Date/Time   NA 142 01/23/2020 1031   K 4.6 01/23/2020 1031   CL 105 01/23/2020 1031   CO2 20 01/23/2020 1031   GLUCOSE 97 01/23/2020 1031   GLUCOSE 88 04/02/2017 2137   BUN 10 01/23/2020 1031   CREATININE 0.96 01/23/2020 1031   CALCIUM 9.6 01/23/2020 1031   GFRNONAA 99 01/23/2020 1031   GFRAA 115 01/23/2020 1031   Renal function: CrCl cannot be calculated (Patient's most recent lab result is older than the maximum 21 days allowed.).  Clinical ASCVD: No  The ASCVD Risk score Denman George DC Jr., et al., 2013) failed to calculate for the following reasons:   Cannot find a previous HDL lab   Cannot find a previous total cholesterol lab  A/P: Hypertension longstanding currently uncontrolled but improving on current medications. BP Goal = < 130/80 mmHg. Medication adherence reported, however he ran out of medications  Saturday 2/19. Anticipate his BP being at goal if he had taken his medications this AM. - Continue current regimen - F/U BMET and lipid panel today - Counseled on lifestyle modifications for blood pressure control including reduced dietary sodium, increased exercise, adequate sleep.  Results reviewed and written information provided.   Total time in face-to-face counseling 20 minutes.   F/U PCP visit needed 05/2020. F/U CPP Visit in 1 month.    Patient seen with:  Adam Phenix, PharmD Candidate  [employer]  Butch Penny, PharmD, [place], CPP Clinical Pharmacist [facility] [phone]

## 2020-03-25 LAB — LIPID PANEL
Chol/HDL Ratio: 2.8 ratio (ref 0.0–5.0)
Cholesterol, Total: 128 mg/dL (ref 100–199)
HDL: 46 mg/dL (ref >39)
LDL Chol Calc (NIH): 68 mg/dL (ref 0–99)
Triglycerides: 65 mg/dL (ref 0–149)
VLDL Cholesterol Cal: 14 mg/dL (ref 5–40)

## 2020-03-25 LAB — BASIC METABOLIC PANEL
BUN/Creatinine Ratio: 9 (ref 9–20)
BUN: 10 mg/dL (ref 6–24)
CO2: 22 mmol/L (ref 20–29)
Calcium: 9.7 mg/dL (ref 8.7–10.2)
Chloride: 101 mmol/L (ref 96–106)
Creatinine, Ser: 1.06 mg/dL (ref 0.76–1.27)
GFR calc Af Amer: 101 mL/min/{1.73_m2} (ref >59)
GFR calc non Af Amer: 87 mL/min/{1.73_m2} (ref >59)
Glucose: 86 mg/dL (ref 65–99)
Potassium: 4.2 mmol/L (ref 3.5–5.2)
Sodium: 140 mmol/L (ref 134–144)

## 2020-03-28 NOTE — Progress Notes (Signed)
Please call patient with update.   Cholesterol normal.   Kidney function normal.

## 2020-05-07 ENCOUNTER — Telehealth: Payer: Self-pay | Admitting: *Deleted

## 2020-05-07 NOTE — Telephone Encounter (Signed)
Attempt to call patient to inform about paperwork that was left at office to have provider complete.   No answer, just busy signal.  Attempted to call wife as well, no answer just busy signal.

## 2020-05-09 NOTE — Telephone Encounter (Signed)
Explained to patient that forms would need to be completed by psychiatry. Informed that attempts were made to make appt as documented in the notes of referral.   Phone number given to patient for f/u.  Advised that paperwork is at the front desk to p/u . Verbalized understanding.

## 2020-05-16 MED FILL — Amlodipine Besylate Tab 10 MG (Base Equivalent): ORAL | 30 days supply | Qty: 30 | Fill #0 | Status: CN

## 2020-05-16 MED FILL — Losartan Potassium Tab 25 MG: ORAL | 30 days supply | Qty: 30 | Fill #0 | Status: CN

## 2020-05-19 ENCOUNTER — Other Ambulatory Visit: Payer: Self-pay

## 2020-05-22 ENCOUNTER — Other Ambulatory Visit: Payer: Self-pay

## 2020-05-22 MED FILL — Trazodone HCl Tab 50 MG: ORAL | 30 days supply | Qty: 30 | Fill #0 | Status: AC

## 2020-05-22 MED FILL — Amlodipine Besylate Tab 10 MG (Base Equivalent): ORAL | 30 days supply | Qty: 30 | Fill #0 | Status: CN

## 2020-05-22 MED FILL — Amlodipine Besylate Tab 10 MG (Base Equivalent): ORAL | 30 days supply | Qty: 30 | Fill #0 | Status: AC

## 2020-05-22 MED FILL — Losartan Potassium Tab 25 MG: ORAL | 30 days supply | Qty: 30 | Fill #0 | Status: AC

## 2020-05-27 ENCOUNTER — Other Ambulatory Visit: Payer: Self-pay

## 2020-06-01 DIAGNOSIS — Z419 Encounter for procedure for purposes other than remedying health state, unspecified: Secondary | ICD-10-CM | POA: Diagnosis not present

## 2020-06-25 ENCOUNTER — Ambulatory Visit (INDEPENDENT_AMBULATORY_CARE_PROVIDER_SITE_OTHER): Payer: 59 | Admitting: Psychology

## 2020-06-25 DIAGNOSIS — F410 Panic disorder [episodic paroxysmal anxiety] without agoraphobia: Secondary | ICD-10-CM

## 2020-06-25 DIAGNOSIS — F419 Anxiety disorder, unspecified: Secondary | ICD-10-CM

## 2020-07-02 DIAGNOSIS — Z419 Encounter for procedure for purposes other than remedying health state, unspecified: Secondary | ICD-10-CM | POA: Diagnosis not present

## 2020-07-11 ENCOUNTER — Other Ambulatory Visit: Payer: Self-pay

## 2020-07-11 MED FILL — Losartan Potassium Tab 25 MG: ORAL | 30 days supply | Qty: 30 | Fill #1 | Status: CN

## 2020-07-11 MED FILL — Amlodipine Besylate Tab 10 MG (Base Equivalent): ORAL | 30 days supply | Qty: 30 | Fill #1 | Status: CN

## 2020-07-11 MED FILL — Trazodone HCl Tab 50 MG: ORAL | 30 days supply | Qty: 30 | Fill #1 | Status: CN

## 2020-07-18 ENCOUNTER — Other Ambulatory Visit: Payer: Self-pay

## 2020-07-18 MED FILL — Losartan Potassium Tab 25 MG: ORAL | 30 days supply | Qty: 30 | Fill #1 | Status: AC

## 2020-07-18 MED FILL — Trazodone HCl Tab 50 MG: ORAL | 30 days supply | Qty: 30 | Fill #1 | Status: AC

## 2020-07-18 MED FILL — Amlodipine Besylate Tab 10 MG (Base Equivalent): ORAL | 30 days supply | Qty: 30 | Fill #1 | Status: AC

## 2020-07-21 ENCOUNTER — Other Ambulatory Visit: Payer: Self-pay

## 2020-08-01 DIAGNOSIS — Z419 Encounter for procedure for purposes other than remedying health state, unspecified: Secondary | ICD-10-CM | POA: Diagnosis not present

## 2020-09-01 DIAGNOSIS — Z419 Encounter for procedure for purposes other than remedying health state, unspecified: Secondary | ICD-10-CM | POA: Diagnosis not present

## 2020-09-26 ENCOUNTER — Other Ambulatory Visit: Payer: Self-pay | Admitting: Internal Medicine

## 2020-09-26 ENCOUNTER — Other Ambulatory Visit: Payer: Self-pay | Admitting: Family

## 2020-09-26 DIAGNOSIS — I10 Essential (primary) hypertension: Secondary | ICD-10-CM

## 2020-09-26 DIAGNOSIS — F5102 Adjustment insomnia: Secondary | ICD-10-CM

## 2020-09-29 ENCOUNTER — Other Ambulatory Visit: Payer: Self-pay

## 2020-09-29 DIAGNOSIS — I10 Essential (primary) hypertension: Secondary | ICD-10-CM

## 2020-09-29 MED ORDER — TRAZODONE HCL 50 MG PO TABS
50.0000 mg | ORAL_TABLET | Freq: Every day | ORAL | 0 refills | Status: DC
  Filled 2020-09-29: qty 30, 30d supply, fill #0

## 2020-09-29 MED ORDER — AMLODIPINE BESYLATE 10 MG PO TABS
ORAL_TABLET | Freq: Every day | ORAL | 0 refills | Status: DC
  Filled 2020-09-29: qty 30, 30d supply, fill #0

## 2020-09-29 MED ORDER — LOSARTAN POTASSIUM 25 MG PO TABS
ORAL_TABLET | Freq: Every day | ORAL | 0 refills | Status: DC
  Filled 2020-09-29: qty 30, fill #0

## 2020-09-29 NOTE — Progress Notes (Unsigned)
Per patient request Amlodipine, Losartan and Trazodone  refilled for courtesy 30 day supply. Please schedule appointment for additional refills

## 2020-10-01 ENCOUNTER — Other Ambulatory Visit: Payer: Self-pay

## 2020-10-02 DIAGNOSIS — Z419 Encounter for procedure for purposes other than remedying health state, unspecified: Secondary | ICD-10-CM | POA: Diagnosis not present

## 2020-11-01 DIAGNOSIS — Z419 Encounter for procedure for purposes other than remedying health state, unspecified: Secondary | ICD-10-CM | POA: Diagnosis not present

## 2020-12-02 DIAGNOSIS — Z419 Encounter for procedure for purposes other than remedying health state, unspecified: Secondary | ICD-10-CM | POA: Diagnosis not present

## 2021-01-01 DIAGNOSIS — Z419 Encounter for procedure for purposes other than remedying health state, unspecified: Secondary | ICD-10-CM | POA: Diagnosis not present

## 2021-02-01 DIAGNOSIS — Z419 Encounter for procedure for purposes other than remedying health state, unspecified: Secondary | ICD-10-CM | POA: Diagnosis not present

## 2021-03-04 DIAGNOSIS — Z419 Encounter for procedure for purposes other than remedying health state, unspecified: Secondary | ICD-10-CM | POA: Diagnosis not present

## 2021-04-01 DIAGNOSIS — Z419 Encounter for procedure for purposes other than remedying health state, unspecified: Secondary | ICD-10-CM | POA: Diagnosis not present

## 2021-04-14 ENCOUNTER — Other Ambulatory Visit: Payer: Self-pay

## 2021-04-14 ENCOUNTER — Encounter: Payer: Self-pay | Admitting: Nurse Practitioner

## 2021-04-14 ENCOUNTER — Ambulatory Visit (INDEPENDENT_AMBULATORY_CARE_PROVIDER_SITE_OTHER): Payer: Medicaid Other | Admitting: Nurse Practitioner

## 2021-04-14 DIAGNOSIS — I10 Essential (primary) hypertension: Secondary | ICD-10-CM | POA: Diagnosis not present

## 2021-04-14 DIAGNOSIS — F5102 Adjustment insomnia: Secondary | ICD-10-CM | POA: Diagnosis not present

## 2021-04-14 MED ORDER — AMLODIPINE BESYLATE 10 MG PO TABS: ORAL_TABLET | Freq: Every day | ORAL | 0 refills | Status: DC

## 2021-04-14 MED ORDER — TRAZODONE HCL 50 MG PO TABS: 50.0000 mg | ORAL_TABLET | Freq: Every day | ORAL | 0 refills | Status: DC

## 2021-04-14 MED ORDER — LOSARTAN POTASSIUM 25 MG PO TABS: ORAL_TABLET | Freq: Every day | ORAL | 0 refills | Status: DC

## 2021-04-14 NOTE — Patient Instructions (Signed)
1. Essential hypertension ? ?- amLODipine (NORVASC) 10 MG tablet; TAKE 1 TABLET (10 MG TOTAL) BY MOUTH DAILY.  Dispense: 30 tablet; Refill: 0 ?- losartan (COZAAR) 25 MG tablet; TAKE 1 TABLET (25 MG TOTAL) BY MOUTH DAILY.  Dispense: 30 tablet; Refill: 0 ?- CBC ?- Comprehensive metabolic panel ? ?DASH Diet ? ?Stay well hydrated ? ?Start exercise routine ? ?2. Adjustment insomnia ? ?- traZODone (DESYREL) 50 MG tablet; Take 1 tablet (50 mg total) by mouth at bedtime.  Dispense: 30 tablet; Refill: 0 ? ? ?Managing Your Hypertension ?Hypertension, also called high blood pressure, is when the force of the blood pressing against the walls of the arteries is too strong. Arteries are blood vessels that carry blood from your heart throughout your body. Hypertension forces the heart to work harder to pump blood and may cause the arteries to become narrow or stiff. ?Understanding blood pressure readings ?Your personal target blood pressure may vary depending on your medical conditions, your age, and other factors. A blood pressure reading includes a higher number over a lower number. Ideally, your blood pressure should be below 120/80. You should know that: ?The first, or top, number is called the systolic pressure. It is a measure of the pressure in your arteries as your heart beats. ?The second, or bottom number, is called the diastolic pressure. It is a measure of the pressure in your arteries as the heart relaxes. ?Blood pressure is classified into four stages. Based on your blood pressure reading, your health care provider may use the following stages to determine what type of treatment you need, if any. Systolic pressure and diastolic pressure are measured in a unit called mmHg. ?Normal ?Systolic pressure: below 123456. ?Diastolic pressure: below 80. ?Elevated ?Systolic pressure: [id number]. ?Diastolic pressure: below 80. ?Hypertension stage 1 ?Systolic pressure: 0000000. ?Diastolic pressure: [id number]. ?Hypertension stage  2 ?Systolic pressure: [id number] or above. ?Diastolic pressure: 90 or above. ?How can this condition affect me? ?Managing your hypertension is an important responsibility. Over time, hypertension can damage the arteries and decrease blood flow to important parts of the body, including the brain, heart, and kidneys. Having untreated or uncontrolled hypertension can lead to: ?A heart attack. ?A stroke. ?A weakened blood vessel (aneurysm). ?Heart failure. ?Kidney damage. ?Eye damage. ?Metabolic syndrome. ?Memory and concentration problems. ?Vascular dementia. ?What actions can I take to manage this condition? ?Hypertension can be managed by making lifestyle changes and possibly by taking medicines. Your health care provider will help you make a plan to bring your blood pressure within a normal range. ?Nutrition ? ?Eat a diet that is high in fiber and potassium, and low in salt (sodium), added sugar, and fat. An example eating plan is called the Dietary Approaches to Stop Hypertension (DASH) diet. To eat this way: ?Eat plenty of fresh fruits and vegetables. Try to fill one-half of your plate at each meal with fruits and vegetables. ?Eat whole grains, such as whole-wheat pasta, brown rice, or whole-grain bread. Fill about one-fourth of your plate with whole grains. ?Eat low-fat dairy products. ?Avoid fatty cuts of meat, processed or cured meats, and poultry with skin. Fill about one-fourth of your plate with lean proteins such as fish, chicken without skin, beans, eggs, and tofu. ?Avoid pre-made and processed foods. These tend to be higher in sodium, added sugar, and fat. ?Reduce your daily sodium intake. Most people with hypertension should eat less than 1,500 mg of sodium a day. ?Lifestyle ? ?Work with your health care provider to maintain a  healthy body weight or to lose weight. Ask what an ideal weight is for you. ?Get at least 30 minutes of exercise that causes your heart to beat faster (aerobic exercise) most days of the  week. Activities may include walking, swimming, or biking. ?Include exercise to strengthen your muscles (resistance exercise), such as weight lifting, as part of your weekly exercise routine. Try to do these types of exercises for 30 minutes at least 3 days a week. ?Do not use any products that contain nicotine or tobacco, such as cigarettes, e-cigarettes, and chewing tobacco. If you need help quitting, ask your health care provider. ?Control any long-term (chronic) conditions you have, such as high cholesterol or diabetes. ?Identify your sources of stress and find ways to manage stress. This may include meditation, deep breathing, or making time for fun activities. ?Alcohol use ?Do not drink alcohol if: ?Your health care provider tells you not to drink. ?You are pregnant, may be pregnant, or are planning to become pregnant. ?If you drink alcohol: ?Limit how much you use to: ?0-1 drink a day for women. ?0-2 drinks a day for men. ?Be aware of how much alcohol is in your drink. In the [place] one drink equals one 12 oz bottle of beer (355 mL), one 5 oz glass of wine (148 mL), or one 1? oz glass of hard liquor (44 mL). ?Medicines ?Your health care provider may prescribe medicine if lifestyle changes are not enough to get your blood pressure under control and if: ?Your systolic blood pressure is 130 or higher. ?Your diastolic blood pressure is 80 or higher. ?Take medicines only as told by your health care provider. Follow the directions carefully. Blood pressure medicines must be taken as told by your health care provider. The medicine does not work as well when you skip doses. Skipping doses also puts you at risk for problems. ?Monitoring ?Before you monitor your blood pressure: ?Do not smoke, drink caffeinated beverages, or exercise within 30 minutes before taking a measurement. ?Use the bathroom and empty your bladder (urinate). ?Sit quietly for at least 5 minutes before taking measurements. ?Monitor your blood  pressure at home as told by your health care provider. To do this: ?Sit with your back straight and supported. ?Place your feet flat on the floor. Do not cross your legs. ?Support your arm on a flat surface, such as a table. Make sure your upper arm is at heart level. ?Each time you measure, take two or three readings one minute apart and record the results. ?You may also need to have your blood pressure checked regularly by your health care provider. ?General information ?Talk with your health care provider about your diet, exercise habits, and other lifestyle factors that may be contributing to hypertension. ?Review all the medicines you take with your health care provider because there may be side effects or interactions. ?Keep all visits as told by your health care provider. Your health care provider can help you create and adjust your plan for managing your high blood pressure. ?Where to find more information ?National Heart, Lung, and Blood Institute: PopSteam.is ?American Heart Association: www.heart.org ?Contact a health care provider if: ?You think you are having a reaction to medicines you have taken. ?You have repeated (recurrent) headaches. ?You feel dizzy. ?You have swelling in your ankles. ?You have trouble with your vision. ?Get help right away if: ?You develop a severe headache or confusion. ?You have unusual weakness or numbness, or you feel faint. ?You have severe pain in your  chest or abdomen. ?You vomit repeatedly. ?You have trouble breathing. ?These symptoms may represent a serious problem that is an emergency. Do not wait to see if the symptoms will go away. Get medical help right away. Call your local emergency services (911 in the [place] Do not drive yourself to the hospital. ?Summary ?Hypertension is when the force of blood pumping through your arteries is too strong. If this condition is not controlled, it may put you at risk for serious complications. ?Your personal target blood  pressure may vary depending on your medical conditions, your age, and other factors. For most people, a normal blood pressure is less than 120/80. ?Hypertension is managed by lifestyle changes, medicines, or both. ?Lifest

## 2021-04-14 NOTE — Assessment & Plan Note (Signed)
-   amLODipine (NORVASC) 10 MG tablet; TAKE 1 TABLET (10 MG TOTAL) BY MOUTH DAILY.  Dispense: 30 tablet; Refill: 0 ?- losartan (COZAAR) 25 MG tablet; TAKE 1 TABLET (25 MG TOTAL) BY MOUTH DAILY.  Dispense: 30 tablet; Refill: 0 ?- CBC ?- Comprehensive metabolic panel ? ?DASH Diet ? ?Stay well hydrated ? ?Start exercise routine ? ?2. Adjustment insomnia ? ?- traZODone (DESYREL) 50 MG tablet; Take 1 tablet (50 mg total) by mouth at bedtime.  Dispense: 30 tablet; Refill: 0 ?

## 2021-04-14 NOTE — Progress Notes (Signed)
? ?@Patient  ID: , male    DOB: 1980/08/06, 41 y.o.   MRN: [id number] ? ?Chief Complaint  ?Patient presents with  ? Medication Refill  ? ? ?Referring provider: ?[id number], NP ? ? ?HPI ? ?Patient presents today with hypertension.  He states that he was previously on amlodipine and losartan but stopped taking the medications because he was unable to afford them.  He now has new insurance and would like to start back on his medications.  Patient has been checking his blood pressures at home and felt that they were better but he thinks that maybe his home blood pressure cuff is not reading his blood pressures correctly.  We discussed that he can bring his blood pressure cuff to the office and we will check it for him.  His blood pressure is elevated in the office today.  We will restart his amlodipine and losartan today.  Handouts were given on DASH diet and hypertension.  Patient also needs a refill on medication for insomnia.  He states that basically he only sleeps about 2 to 3 hours per night.  He works second shift and gets home and in the bed around midnight and then he wakes back up at about 3:00 in the morning and is up after that time.  Denies f/c/s, n/v/d, hemoptysis, PND, chest pain or edema. ? ? ? ? ? ? ?No Known Allergies ? ? ?There is no immunization history on file for this patient. ? ?Past Medical History:  ?Diagnosis Date  ? Chronic back pain   ? Hypertension   ? ? ?Tobacco History: ?Social History  ? ?Tobacco Use  ?Smoking Status Every Day  ? Packs/day: 1.00  ? Types: Cigarettes  ?Smokeless Tobacco Never  ? ?Ready to quit: No ?Counseling given: Yes ? ? ?Outpatient Encounter Medications as of 04/14/2021  ?Medication Sig  ? amLODipine (NORVASC) 10 MG tablet TAKE 1 TABLET (10 MG TOTAL) BY MOUTH DAILY.  ? diclofenac (VOLTAREN) 50 MG EC tablet Take 1 tablet (50 mg total) by mouth 2 (two) times daily. (Patient not taking: Reported on 02/08/2020)  ? losartan (COZAAR) 25 MG tablet TAKE 1  TABLET (25 MG TOTAL) BY MOUTH DAILY.  ? naproxen (NAPROSYN) 500 MG tablet Take 1 tablet (500 mg total) by mouth 2 (two) times daily. (Patient not taking: Reported on 02/08/2020)  ? tiZANidine (ZANAFLEX) 4 MG tablet Take 1 tablet (4 mg total) by mouth every 6 (six) hours as needed for muscle spasms. (Patient not taking: Reported on 02/08/2020)  ? traZODone (DESYREL) 50 MG tablet Take 1 tablet (50 mg total) by mouth at bedtime.  ? [DISCONTINUED] amLODipine (NORVASC) 10 MG tablet TAKE 1 TABLET (10 MG TOTAL) BY MOUTH DAILY. (Patient not taking: Reported on 04/14/2021)  ? [DISCONTINUED] losartan (COZAAR) 25 MG tablet TAKE 1 TABLET (25 MG TOTAL) BY MOUTH DAILY. (Patient not taking: Reported on 04/14/2021)  ? [DISCONTINUED] traZODone (DESYREL) 50 MG tablet Take 1 tablet (50 mg total) by mouth at bedtime. (Patient not taking: Reported on 04/14/2021)  ? ?No facility-administered encounter medications on file as of 04/14/2021.  ? ? ? ?Review of Systems ? ?Review of Systems  ?Constitutional: Negative.   ?HENT: Negative.    ?Cardiovascular: Negative.   ?Gastrointestinal: Negative.   ?Allergic/Immunologic: Negative.   ?Neurological: Negative.   ?Psychiatric/Behavioral: Negative.     ? ? ? ?Physical Exam ? ?BP (!) 164/109   Pulse 68   Resp 16   Ht 5\' 5"  (1.651 m)  Wt 170 lb 2 oz (77.2 kg)   SpO2 98%   BMI 28.31 kg/m?  ? ?Wt Readings from Last 5 Encounters:  ?04/14/21 170 lb 2 oz (77.2 kg)  ?02/08/20 167 lb (75.8 kg)  ?01/23/20 167 lb 3.2 oz (75.8 kg)  ?06/18/19 168 lb (76.2 kg)  ?05/21/19 171 lb (77.6 kg)  ? ? ? ?Physical Exam ?Vitals and nursing note reviewed.  ?Constitutional:   ?   General: He is not in acute distress. ?   Appearance: He is well-developed.  ?Cardiovascular:  ?   Rate and Rhythm: Normal rate and regular rhythm.  ?Pulmonary:  ?   Effort: Pulmonary effort is normal.  ?   Breath sounds: Normal breath sounds.  ?Skin: ?   General: Skin is warm and dry.  ?Neurological:  ?   Mental Status: He is alert and oriented to  person, place, and time.  ? ? ? ?Lab Results: ? ?CBC ?   ?Component Value Date/Time  ? WBC 9.2 05/21/2019 1133  ? WBC 8.3 04/02/2017 2137  ? RBC 5.87 (H) 05/21/2019 1133  ? RBC 5.28 04/02/2017 2137  ? HGB 14.3 05/21/2019 1133  ? HCT 45.6 05/21/2019 1133  ? PLT 298 05/21/2019 1133  ? MCV 78 (L) 05/21/2019 1133  ? MCH 24.4 (L) 05/21/2019 1133  ? MCH 25.0 (L) 04/02/2017 2137  ? MCHC 31.4 (L) 05/21/2019 1133  ? MCHC 33.9 04/02/2017 2137  ? RDW 14.0 05/21/2019 1133  ? LYMPHSABS 3.1 05/21/2019 1133  ? EOSABS 0.5 (H) 05/21/2019 1133  ? BASOSABS 0.1 05/21/2019 1133  ? ? ?BMET ?   ?Component Value Date/Time  ? NA 140 03/24/2020 1051  ? K 4.2 03/24/2020 1051  ? CL 101 03/24/2020 1051  ? CO2 22 03/24/2020 1051  ? GLUCOSE 86 03/24/2020 1051  ? GLUCOSE 88 04/02/2017 2137  ? BUN 10 03/24/2020 1051  ? CREATININE 1.06 03/24/2020 1051  ? CALCIUM 9.7 03/24/2020 1051  ? GFRNONAA 87 03/24/2020 1051  ? GFRAA 101 03/24/2020 1051  ? ? ?BNP ?No results found for: BNP ? ?ProBNP ?No results found for: PROBNP ? ?Imaging: ?No results found. ? ? ?Assessment & Plan:  ? ?Essential hypertension ?- amLODipine (NORVASC) 10 MG tablet; TAKE 1 TABLET (10 MG TOTAL) BY MOUTH DAILY.  Dispense: 30 tablet; Refill: 0 ?- losartan (COZAAR) 25 MG tablet; TAKE 1 TABLET (25 MG TOTAL) BY MOUTH DAILY.  Dispense: 30 tablet; Refill: 0 ?- CBC ?- Comprehensive metabolic panel ? ?DASH Diet ? ?Stay well hydrated ? ?Start exercise routine ? ?2. Adjustment insomnia ? ?- traZODone (DESYREL) 50 MG tablet; Take 1 tablet (50 mg total) by mouth at bedtime.  Dispense: 30 tablet; Refill: 0 ? ? ? ? ?Ivonne Andrew, NP ?04/14/2021 ? ?

## 2021-04-15 LAB — COMPREHENSIVE METABOLIC PANEL
ALT: 31 IU/L (ref 0–44)
AST: 37 IU/L (ref 0–40)
Albumin/Globulin Ratio: 2.5 — ABNORMAL HIGH (ref 1.2–2.2)
Albumin: 4.9 g/dL (ref 4.0–5.0)
Alkaline Phosphatase: 75 IU/L (ref 44–121)
BUN/Creatinine Ratio: 12 (ref 9–20)
BUN: 15 mg/dL (ref 6–24)
Bilirubin Total: 0.5 mg/dL (ref 0.0–1.2)
CO2: 26 mmol/L (ref 20–29)
Calcium: 9.5 mg/dL (ref 8.7–10.2)
Chloride: 99 mmol/L (ref 96–106)
Creatinine, Ser: 1.25 mg/dL (ref 0.76–1.27)
Globulin, Total: 2 g/dL (ref 1.5–4.5)
Glucose: 94 mg/dL (ref 70–99)
Potassium: 4.7 mmol/L (ref 3.5–5.2)
Sodium: 135 mmol/L (ref 134–144)
Total Protein: 6.9 g/dL (ref 6.0–8.5)
eGFR: 74 mL/min/{1.73_m2} (ref >59)

## 2021-04-15 LAB — CBC
Hematocrit: 48.7 % (ref 37.5–51.0)
Hemoglobin: 15.3 g/dL (ref 13.0–17.7)
MCH: 23.9 pg — ABNORMAL LOW (ref 26.6–33.0)
MCHC: 31.4 g/dL — ABNORMAL LOW (ref 31.5–35.7)
MCV: 76 fL — ABNORMAL LOW (ref 79–97)
Platelets: 304 10*3/uL (ref 150–450)
RBC: 6.4 x10E6/uL — ABNORMAL HIGH (ref 4.14–5.80)
RDW: 15.2 % (ref 11.6–15.4)
WBC: 9 10*3/uL (ref 3.4–10.8)

## 2021-04-16 ENCOUNTER — Telehealth: Payer: Self-pay | Admitting: Nurse Practitioner

## 2021-04-16 NOTE — Telephone Encounter (Signed)
Contacted pt to go over lab results pt is aware and doesn't have any questions or concerns 

## 2021-04-23 NOTE — Progress Notes (Signed)
Erroneous encounter

## 2021-04-29 ENCOUNTER — Encounter: Payer: Medicaid Other | Admitting: Family

## 2021-04-29 DIAGNOSIS — I10 Essential (primary) hypertension: Secondary | ICD-10-CM

## 2021-04-29 DIAGNOSIS — F5102 Adjustment insomnia: Secondary | ICD-10-CM

## 2021-05-02 DIAGNOSIS — Z419 Encounter for procedure for purposes other than remedying health state, unspecified: Secondary | ICD-10-CM | POA: Diagnosis not present

## 2021-06-01 DIAGNOSIS — Z419 Encounter for procedure for purposes other than remedying health state, unspecified: Secondary | ICD-10-CM | POA: Diagnosis not present

## 2021-07-02 DIAGNOSIS — Z419 Encounter for procedure for purposes other than remedying health state, unspecified: Secondary | ICD-10-CM | POA: Diagnosis not present

## 2021-07-07 ENCOUNTER — Other Ambulatory Visit: Payer: Self-pay

## 2021-07-07 ENCOUNTER — Ambulatory Visit
Admission: EM | Admit: 2021-07-07 | Discharge: 2021-07-07 | Disposition: A | Discharge status: Home / Self Care | Payer: Medicaid Other | Attending: Student | Admitting: Student

## 2021-07-07 ENCOUNTER — Encounter: Payer: Self-pay | Admitting: Emergency Medicine

## 2021-07-07 DIAGNOSIS — S46911A Strain of unspecified muscle, fascia and tendon at shoulder and upper arm level, right arm, initial encounter: Secondary | ICD-10-CM

## 2021-07-07 MED ORDER — METHYLPREDNISOLONE SODIUM SUCC 125 MG IJ SOLR: 80.0000 mg | Freq: Once | INTRAMUSCULAR | Status: DC

## 2021-07-07 MED ORDER — MELOXICAM 7.5 MG PO TABS
7.5000 mg | ORAL_TABLET | Freq: Every day | ORAL | 0 refills | Status: DC
Start: 2021-07-07 — End: 2021-09-29

## 2021-07-07 MED ORDER — CYCLOBENZAPRINE HCL 10 MG PO TABS: 10.0000 mg | ORAL_TABLET | Freq: Two times a day (BID) | ORAL | 0 refills | Status: DC | PRN

## 2021-07-07 MED ORDER — DEXAMETHASONE SODIUM PHOSPHATE 10 MG/ML IJ SOLN
10.0000 mg | Freq: Once | INTRAMUSCULAR | Status: AC
  Administered 2021-07-07: 10 mg via INTRAMUSCULAR

## 2021-07-07 NOTE — ED Triage Notes (Signed)
Pt here for right shoulder pain after moving this past weekend and lifting heavy furniture; pt sts hx of chronic right shoulder pain

## 2021-07-07 NOTE — ED Provider Notes (Signed)
[name]    CSN: [id number] Arrival date & time: 07/07/21  1620      History   Chief Complaint Chief Complaint  Patient presents with   Shoulder Pain    HPI Phillip Mcconnell is a 41 y.o. male presenting with R shoulder pain. History chronic R shoulder pain.  Patient states that he was moving furniture alone this last weekend, soon after developed pain over the right shoulder.  There was no specific incident or trauma.  He states that the pain is worse over the anterior and superior shoulder, and the stiffness is limiting movement.  He is right-handed.  Denies sensation changes.  Has attempted 1 pill of Flexeril that he had leftover without relief.  HPI  Past Medical History:  Diagnosis Date   Chronic back pain    Hypertension     Patient Active Problem List   Diagnosis Date Noted   Essential hypertension 06/18/2019    History reviewed. No pertinent surgical history.     Home Medications    Prior to Admission medications   Medication Sig Start Date End Date Taking? Authorizing Provider  cyclobenzaprine (FLEXERIL) 10 MG tablet Take 1 tablet (10 mg total) by mouth 2 (two) times daily as needed for muscle spasms. 07/07/21  Yes [name], [name]  meloxicam (MOBIC) 7.5 MG tablet Take 1 tablet (7.5 mg total) by mouth daily. 07/07/21  Yes [name], [name]  amLODipine (NORVASC) 10 MG tablet TAKE 1 TABLET (10 MG TOTAL) BY MOUTH DAILY. 04/14/21 04/14/22  Fenton Foy, NP  losartan (COZAAR) 25 MG tablet TAKE 1 TABLET (25 MG TOTAL) BY MOUTH DAILY. 04/14/21 04/14/22  Fenton Foy, NP  traZODone (DESYREL) 50 MG tablet Take 1 tablet (50 mg total) by mouth at bedtime. 04/14/21   Fenton Foy, NP    Family History History reviewed. No pertinent family history.  Social History Social History   Tobacco Use   Smoking status: Every Day    Packs/day: 1.00    Types: Cigarettes   Smokeless tobacco: Never  Vaping Use   Vaping Use: Never used  Substance  Use Topics   Alcohol use: Yes    Alcohol/week: 5.0 standard drinks    Types: 5 Cans of beer per week    Comment: occ   Drug use: No     Allergies   Patient has no known allergies.   Review of Systems Review of Systems  Musculoskeletal:        R shoulder pain   All other systems reviewed and are negative.   Physical Exam Triage Vital Signs ED Triage Vitals [07/07/21 1805]  Enc Vitals Group     BP (!) 157/98     Pulse Rate 74     Resp 18     Temp 98.9 F (37.2 C)     Temp Source Oral     SpO2 95 %     Weight      Height      Head Circumference      Peak Flow      Pain Score      Pain Loc      Pain Edu?      Excl. in English?    No data found.  Updated Vital Signs BP (!) 157/98 (BP Location: Left Arm)   Pulse 74   Temp 98.9 F (37.2 C) (Oral)   Resp 18   SpO2 95%   Visual Acuity Right Eye Distance:  Left Eye Distance:   Bilateral Distance:    Right Eye Near:   Left Eye Near:    Bilateral Near:     Physical Exam Vitals reviewed.  Constitutional:      General: He is not in acute distress.    Appearance: Normal appearance. He is not ill-appearing.  HENT:     Head: Normocephalic and atraumatic.  Pulmonary:     Effort: Pulmonary effort is normal.  Musculoskeletal:     Comments: R shoulder - no skin changes or swelling. ROM limited due to stiffness and pain. TTP anterior shoulder and proximal humerus without point tenderness or bony abnomality. Grip strength 5/5, ROM elbow intact and without pain. Cap refill <2 seconds, radial pulse 2+.  Neurological:     General: No focal deficit present.     Mental Status: He is alert and oriented to person, place, and time.  Psychiatric:        Mood and Affect: Mood normal.        Behavior: Behavior normal.        Thought Content: Thought content normal.        Judgment: Judgment normal.     UC Treatments / Results  Labs (all labs ordered are listed, but only abnormal results are displayed) Labs Reviewed - No  data to display  EKG   Radiology No results found.  Procedures Procedures (including critical care time)  Medications Ordered in UC Medications  dexamethasone (DECADRON) injection 10 mg (10 mg Intramuscular Given 07/07/21 1841)    Initial Impression / Assessment and Plan / UC Course  I have reviewed the triage vital signs and the nursing notes.  Pertinent labs & imaging results that were available during my care of the patient were reviewed by me and considered in my medical decision making (see chart for details).     This patient is a very pleasant 41 y.o. year old male presenting with R shoulder strain following heavy lifting.  neurovascularly intact.  No acute trauma, symptoms started the day after heavy lifting.  Differential includes rotator cuff strain.  IM Decadron administered, and Flexeril and Mobic sent.  Follow-up with [facility], information provided.  Work note provided.  He is in agreement.  Final Clinical Impressions(s) / UC Diagnoses   Final diagnoses:  Right shoulder strain, initial encounter     Discharge Instructions      -Flexeril up to twice daily for muscular pain. This will make you sleepy.  -Meloxicam for pain, start with 1 pill daily with a meal, can increase to 2 pills daily with a meal (every 12 hours).  Do not take other NSAIDs with this medication including ibuprofen or naproxen. -If symptoms persist in 2-3 days, follow-up with an orthopedist. I recommend [facility] at [address], [place], [place] [address]. You can schedule an appointment by calling [phone]) or online ([email or web address]), but they also have a walk-in clinic M-F 8a-8p and Sat 10a-3p.      ED Prescriptions     Medication Sig Dispense Auth. Provider   meloxicam (MOBIC) 7.5 MG tablet Take 1 tablet (7.5 mg total) by mouth daily. 20 tablet [name], [name]   cyclobenzaprine (FLEXERIL) 10 MG tablet Take 1 tablet (10 mg total) by mouth 2 (two) times daily as  needed for muscle spasms. 20 tablet [name], [name]      PDMP not reviewed this encounter.   [name], [name] 07/07/21 1906

## 2021-07-07 NOTE — Discharge Instructions (Addendum)
-  Flexeril up to twice daily for muscular pain. This will make you sleepy.  -Meloxicam for pain, start with 1 pill daily with a meal, can increase to 2 pills daily with a meal (every 12 hours).  Do not take other NSAIDs with this medication including ibuprofen or naproxen. -If symptoms persist in 2-3 days, follow-up with an orthopedist. I recommend [facility] at [address], [place], [place] [address]. You can schedule an appointment by calling [phone]) or online ([email or web address]), but they also have a walk-in clinic M-F 8a-8p and Sat 10a-3p.

## 2021-07-10 DIAGNOSIS — M25511 Pain in right shoulder: Secondary | ICD-10-CM | POA: Diagnosis not present

## 2021-07-22 DIAGNOSIS — M25511 Pain in right shoulder: Secondary | ICD-10-CM | POA: Diagnosis not present

## 2021-07-30 DIAGNOSIS — M25511 Pain in right shoulder: Secondary | ICD-10-CM | POA: Diagnosis not present

## 2021-08-01 DIAGNOSIS — Z419 Encounter for procedure for purposes other than remedying health state, unspecified: Secondary | ICD-10-CM | POA: Diagnosis not present

## 2021-09-01 DIAGNOSIS — Z419 Encounter for procedure for purposes other than remedying health state, unspecified: Secondary | ICD-10-CM | POA: Diagnosis not present

## 2021-09-20 NOTE — Progress Notes (Signed)
Patient ID: Phillip Mcconnell, male    DOB: 1980-03-12  MRN: [id number]  CC: Chronic Care Management   Subjective: Phillip Mcconnell is a 41 y.o. male who presents for chronic care management.   His concerns today include:  - Doing well on blood pressure medications without issues or concerns.  - Doing well on insomnia medication without issues or concerns.  - Right-sided neck pain. Thinks may be positional from sleeping. Tried over-the-counter NSAID's without improvement.  - Reports anxiety has overall improved. However, still having anxiety/panic attacks intermittently.      09/29/2021    9:36 AM 04/14/2021    9:31 AM 02/21/2020    3:39 PM 02/21/2020   10:56 AM 02/08/2020    9:37 AM  Depression screen PHQ 2/9  Decreased Interest 0 0 0 0 0  Down, Depressed, Hopeless 0 0 0 0 1  PHQ - 2 Score 0 0 0 0 1  Altered sleeping  3 3 3    Tired, decreased energy  2 2 2    Change in appetite  1 0 0   Feeling bad or failure about yourself   0 0 0   Trouble concentrating   0 0   Moving slowly or fidgety/restless  0 0 0   Suicidal thoughts  0 0 0   PHQ-9 Score  6 5 5    Difficult doing work/chores  Somewhat difficult        Patient Active Problem List   Diagnosis Date Noted   Essential hypertension 06/18/2019     Current Outpatient Medications on File Prior to Visit  Medication Sig Dispense Refill   cyclobenzaprine (FLEXERIL) 10 MG tablet Take 1 tablet (10 mg total) by mouth 2 (two) times daily as needed for muscle spasms. 20 tablet 0   No current facility-administered medications on file prior to visit.    No Known Allergies  Social History   Socioeconomic History   Marital status: Married    Spouse name: Not on file   Number of children: Not on file   Years of education: Not on file   Highest education level: Not on file  Occupational History   Not on file  Tobacco Use   Smoking status: Every Day    Packs/day: 1.00    Types: Cigarettes    Passive exposure: Current    Smokeless tobacco: Never  Vaping Use   Vaping Use: Never used  Substance and Sexual Activity   Alcohol use: Yes    Alcohol/week: 5.0 standard drinks of alcohol    Types: 5 Cans of beer per week    Comment: occ   Drug use: No   Sexual activity: Yes  Other Topics Concern   Not on file  Social History Narrative   Not on file   Social Determinants of Health   Financial Resource Strain: Not on file  Food Insecurity: Not on file  Transportation Needs: Not on file  Physical Activity: Not on file  Stress: Not on file  Social Connections: Not on file  Intimate Partner Violence: Not on file    No family history on file.  No past surgical history on file.  ROS: Review of Systems Negative except as stated above  PHYSICAL EXAM: BP 137/83 (BP Location: Left Arm, Patient Position: Sitting, Cuff Size: Normal)   Pulse 70   Temp 98.3 F (36.8 C)   Resp 16   Ht 5\' 5"  (1.651 m)   Wt 170 lb (77.1 kg)   SpO2 98%  BMI 28.29 kg/m   Physical Exam HENT:     Head: Normocephalic and atraumatic.  Eyes:     Extraocular Movements: Extraocular movements intact.     Conjunctiva/sclera: Conjunctivae normal.     Pupils: Pupils are equal, round, and reactive to light.  Cardiovascular:     Rate and Rhythm: Normal rate and regular rhythm.     Pulses: Normal pulses.     Heart sounds: Normal heart sounds.  Pulmonary:     Effort: Pulmonary effort is normal.     Breath sounds: Normal breath sounds.  Musculoskeletal:     Cervical back: Normal range of motion and neck supple.  Neurological:     General: No focal deficit present.     Mental Status: He is alert and oriented to person, place, and time.  Psychiatric:        Mood and Affect: Mood normal.        Behavior: Behavior normal.    ASSESSMENT AND PLAN: 1. Primary hypertension - Continue Amlodipine and Losartan as prescribed.  - Counseled on blood pressure goal of less than 130/80, low-sodium, DASH diet, medication compliance, and  150 minutes of moderate intensity exercise per week as tolerated. Counseled on medication adherence and adverse effects. - Update BMP. - Follow-up with primary provider in 3 months or sooner if needed.  - Basic Metabolic Panel - amLODipine (NORVASC) 10 MG tablet; Take 1 tablet (10 mg total) by mouth daily.  Dispense: 30 tablet; Refill: 2 - losartan (COZAAR) 25 MG tablet; Take 1 tablet (25 mg total) by mouth daily.  Dispense: 30 tablet; Refill: 2  2. Insomnia, unspecified type 3. Adjustment disorder with mixed anxiety and depressed mood - Patient denies thoughts of self-harm, suicidal ideations, homicidal ideations. - Continue Trazodone as prescribed.  - Referral to Psychiatry for further evaluation and management.  - traZODone (DESYREL) 50 MG tablet; Take 1 tablet (50 mg total) by mouth at bedtime.  Dispense: 30 tablet; Refill: 2 - Ambulatory referral to Psychiatry  4. Neck discomfort - Meloxicam as prescribed. Counseled on medication adherence and adverse effects.  - Follow-up with primary provider as scheduled.  - meloxicam (MOBIC) 7.5 MG tablet; Take 1 tablet (7.5 mg total) by mouth daily.  Dispense: 30 tablet; Refill: 1  5. Diabetes mellitus screening - Routine screening.  - Hemoglobin A1c   Patient was given the opportunity to ask questions.  Patient verbalized understanding of the plan and was able to repeat key elements of the plan. Patient was given clear instructions to go to Emergency Department or return to medical center if symptoms don't improve, worsen, or new problems develop.The patient verbalized understanding.   Orders Placed This Encounter  Procedures   Basic Metabolic Panel   Hemoglobin A1c   Ambulatory referral to Psychiatry     Requested Prescriptions   Signed Prescriptions Disp Refills   amLODipine (NORVASC) 10 MG tablet 30 tablet 2    Sig: Take 1 tablet (10 mg total) by mouth daily.   losartan (COZAAR) 25 MG tablet 30 tablet 2    Sig: Take 1 tablet  (25 mg total) by mouth daily.   traZODone (DESYREL) 50 MG tablet 30 tablet 2    Sig: Take 1 tablet (50 mg total) by mouth at bedtime.   meloxicam (MOBIC) 7.5 MG tablet 30 tablet 1    Sig: Take 1 tablet (7.5 mg total) by mouth daily.    Return in about 3 months (around 12/30/2021) for Follow-Up or next available chronic care  mgmt.  Rema Fendt, NP

## 2021-09-29 ENCOUNTER — Ambulatory Visit (INDEPENDENT_AMBULATORY_CARE_PROVIDER_SITE_OTHER): Payer: Medicaid Other | Admitting: Family

## 2021-09-29 ENCOUNTER — Encounter: Payer: Self-pay | Admitting: Family

## 2021-09-29 VITALS — BP 137/83 | HR 70 | Temp 98.3°F | Resp 16 | Ht 65.0 in | Wt 170.0 lb

## 2021-09-29 DIAGNOSIS — Z131 Encounter for screening for diabetes mellitus: Secondary | ICD-10-CM

## 2021-09-29 DIAGNOSIS — M542 Cervicalgia: Secondary | ICD-10-CM

## 2021-09-29 DIAGNOSIS — I10 Essential (primary) hypertension: Secondary | ICD-10-CM

## 2021-09-29 DIAGNOSIS — G4700 Insomnia, unspecified: Secondary | ICD-10-CM

## 2021-09-29 DIAGNOSIS — F4323 Adjustment disorder with mixed anxiety and depressed mood: Secondary | ICD-10-CM | POA: Diagnosis not present

## 2021-09-29 MED ORDER — MELOXICAM 7.5 MG PO TABS: 7.5000 mg | ORAL_TABLET | Freq: Every day | ORAL | 1 refills | Status: DC

## 2021-09-29 MED ORDER — TRAZODONE HCL 50 MG PO TABS: 50.0000 mg | ORAL_TABLET | Freq: Every day | ORAL | 2 refills | Status: AC

## 2021-09-29 MED ORDER — LOSARTAN POTASSIUM 25 MG PO TABS: 25.0000 mg | ORAL_TABLET | Freq: Every day | ORAL | 2 refills | Status: AC

## 2021-09-29 MED ORDER — AMLODIPINE BESYLATE 10 MG PO TABS: 10.0000 mg | ORAL_TABLET | Freq: Every day | ORAL | 2 refills | Status: AC

## 2021-09-29 NOTE — Progress Notes (Signed)
.  Pt presents for chronic care management   -needs refills on Amlodipine, Losartan, and Trazodone -out of meds for 2 months

## 2021-09-30 ENCOUNTER — Ambulatory Visit
Admission: EM | Admit: 2021-09-30 | Discharge: 2021-09-30 | Disposition: A | Discharge status: Home / Self Care | Payer: Medicaid Other | Attending: Physician Assistant | Admitting: Physician Assistant

## 2021-09-30 ENCOUNTER — Encounter: Payer: Self-pay | Admitting: Family

## 2021-09-30 DIAGNOSIS — M542 Cervicalgia: Secondary | ICD-10-CM | POA: Diagnosis not present

## 2021-09-30 DIAGNOSIS — M62838 Other muscle spasm: Secondary | ICD-10-CM

## 2021-09-30 DIAGNOSIS — R7303 Prediabetes: Secondary | ICD-10-CM | POA: Insufficient documentation

## 2021-09-30 LAB — BASIC METABOLIC PANEL
BUN/Creatinine Ratio: 11 (ref 9–20)
BUN: 11 mg/dL (ref 6–24)
CO2: 22 mmol/L (ref 20–29)
Calcium: 9.2 mg/dL (ref 8.7–10.2)
Chloride: 104 mmol/L (ref 96–106)
Creatinine, Ser: 1.03 mg/dL (ref 0.76–1.27)
Glucose: 94 mg/dL (ref 70–99)
Potassium: 4.3 mmol/L (ref 3.5–5.2)
Sodium: 142 mmol/L (ref 134–144)
eGFR: 94 mL/min/{1.73_m2} (ref >59)

## 2021-09-30 LAB — HEMOGLOBIN A1C
Est. average glucose Bld gHb Est-mCnc: 128 mg/dL
Hgb A1c MFr Bld: 6.1 % — ABNORMAL HIGH (ref 4.8–5.6)

## 2021-09-30 MED ORDER — IBUPROFEN 800 MG PO TABS: 800.0000 mg | ORAL_TABLET | Freq: Three times a day (TID) | ORAL | 0 refills | Status: AC

## 2021-09-30 MED ORDER — CYCLOBENZAPRINE HCL 10 MG PO TABS: 10.0000 mg | ORAL_TABLET | Freq: Two times a day (BID) | ORAL | 0 refills | Status: DC | PRN

## 2021-09-30 MED ORDER — LIDOCAINE 5 % EX PTCH: 1.0000 | MEDICATED_PATCH | CUTANEOUS | 0 refills | Status: AC

## 2021-09-30 NOTE — Discharge Instructions (Signed)
Go get your x-ray at [facility].  We will contact you with these results.  Use ibuprofen 800 mg up to 3 times a day.  Do not take NSAIDs including aspirin, ibuprofen/Advil, naproxen/Aleve with this medication.  You can use Tylenol for breakthrough pain.  Take cyclobenzaprine up to twice a day.  This will make you sleepy do not drive or drink alcohol with taking it.  Use lidocaine patch on specific area for 12 hours during the day but then take it off for 12 hours at night.  Use only 1 patch per 24 hours.  As we discussed, if your symptoms are proving quickly you need to go to the emergency room for further evaluation and management.  If anything worsens and you develop fever, nausea, vomiting, increased pain, headache, difficulty swallowing, difficulty breathing, swelling of your throat you need to go to the emergency room immediately.

## 2021-09-30 NOTE — ED Triage Notes (Signed)
Pt c/o neck pain onset ~ Sunday causing limited ROM. Unable to turn head left or right. Also has sore throat and concerned for covid. States was prescribed Mobic yesterday without relief.

## 2021-09-30 NOTE — ED Provider Notes (Signed)
[name]    CSN: [id number] Arrival date & time: 09/30/21  1557      History   Chief Complaint Chief Complaint  Patient presents with   Neck Pain    HPI Phillip Mcconnell is a 41 y.o. male.   Patient presents today with a 2-day history of right-sided neck pain.  He reports that symptoms are gradually been worsening prompting evaluation today.  He was seen by his PCP yesterday (09/29/2021) at which point he was prescribed Mobic 7.5 mg and encouraged to use this with Tylenol for pain relief.  He has been taking medication without improvement of symptoms.  Pain is rated 8 on a 0-10 pain scale, worse with certain movements, no alleviating factors identified.  He denies any previous injury or surgery involving his neck.  Denies any weakness or paresthesias in his upper extremities.  He does report associated sore throat on that side but denies any additional symptoms including fever, cough, congestion.  He is having difficulty with daily activities as result of symptoms.    Past Medical History:  Diagnosis Date   Chronic back pain    Hypertension     Patient Active Problem List   Diagnosis Date Noted   Prediabetes 09/30/2021   Essential hypertension 06/18/2019    History reviewed. No pertinent surgical history.     Home Medications    Prior to Admission medications   Medication Sig Start Date End Date Taking? Authorizing Provider  cyclobenzaprine (FLEXERIL) 10 MG tablet Take 1 tablet (10 mg total) by mouth 2 (two) times daily as needed for muscle spasms. 09/30/21  Yes Vallie Fayette K, [name]  ibuprofen (ADVIL) 800 MG tablet Take 1 tablet (800 mg total) by mouth 3 (three) times daily. 09/30/21  Yes Itzae Mccurdy K, [name]  lidocaine (LIDODERM) 5 % Place 1 patch onto the skin daily. Remove & Discard patch within 12 hours or as directed by MD 09/30/21  Yes Cliffard Hair K, [name]  amLODipine (NORVASC) 10 MG tablet Take 1 tablet (10 mg total) by mouth daily. 09/29/21 12/28/21   Rema Fendt, NP  losartan (COZAAR) 25 MG tablet Take 1 tablet (25 mg total) by mouth daily. 09/29/21 12/28/21  Rema Fendt, NP  traZODone (DESYREL) 50 MG tablet Take 1 tablet (50 mg total) by mouth at bedtime. 09/29/21   Rema Fendt, NP    Family History History reviewed. No pertinent family history.  Social History Social History   Tobacco Use   Smoking status: Every Day    Packs/day: 1.00    Types: Cigarettes    Passive exposure: Current   Smokeless tobacco: Never  Vaping Use   Vaping Use: Never used  Substance Use Topics   Alcohol use: Yes    Alcohol/week: 5.0 standard drinks of alcohol    Types: 5 Cans of beer per week    Comment: occ   Drug use: No     Allergies   Patient has no known allergies.   Review of Systems Review of Systems  Constitutional:  Positive for activity change. Negative for appetite change, fatigue and fever.  HENT:  Positive for sore throat. Negative for congestion, trouble swallowing and voice change.   Respiratory:  Negative for cough and shortness of breath.   Cardiovascular:  Negative for chest pain.  Gastrointestinal:  Negative for abdominal pain, diarrhea, nausea and vomiting.  Musculoskeletal:  Positive for neck pain. Negative for arthralgias and myalgias.  Neurological:  Negative for dizziness, weakness, light-headedness,  numbness and headaches.     Physical Exam Triage Vital Signs ED Triage Vitals [09/30/21 1633]  Enc Vitals Group     BP (!) 150/88     Pulse Rate 80     Resp 18     Temp 98.4 F (36.9 C)     Temp Source Oral     SpO2 97 %     Weight      Height      Head Circumference      Peak Flow      Pain Score 8     Pain Loc      Pain Edu?      Excl. in [place]?    No data found.  Updated Vital Signs BP (!) 150/88 (BP Location: Left Arm)   Pulse 80   Temp 98.4 F (36.9 C) (Oral)   Resp 18   SpO2 97%   Visual Acuity Right Eye Distance:   Left Eye Distance:   Bilateral Distance:    Right Eye Near:    Left Eye Near:    Bilateral Near:     Physical Exam Vitals reviewed.  Constitutional:      General: He is awake.     Appearance: Normal appearance. He is well-developed. He is not ill-appearing.     Comments: Very pleasant male appears stated age in no acute distress sitting comfortably in exam room  HENT:     Head: Normocephalic and atraumatic.     Mouth/Throat:     Pharynx: Uvula midline. No oropharyngeal exudate or posterior oropharyngeal erythema.     Tonsils: No tonsillar exudate or tonsillar abscesses. 0 on the right. 0 on the left.     Comments: Normal-appearing posterior oropharynx Cardiovascular:     Rate and Rhythm: Normal rate and regular rhythm.     Heart sounds: Normal heart sounds, S1 normal and S2 normal. No murmur heard. Pulmonary:     Effort: Pulmonary effort is normal.     Breath sounds: Normal breath sounds. No stridor. No wheezing, rhonchi or rales.     Comments: Clear to auscultation bilaterally Abdominal:     Palpations: Abdomen is soft.     Tenderness: There is no abdominal tenderness.  Musculoskeletal:     Cervical back: Spasms, tenderness and bony tenderness present.     Thoracic back: No tenderness or bony tenderness.     Lumbar back: No tenderness or bony tenderness.     Comments: Significant tenderness palpation and swelling noted along right trapezius and sternocleidomastoid.  Decreased range of motion with rotation and lateral flexion secondary to pain.  Normal pincer and grip strength of hands.  Neurological:     Mental Status: He is alert.  Psychiatric:        Behavior: Behavior is cooperative.      UC Treatments / Results  Labs (all labs ordered are listed, but only abnormal results are displayed) Labs Reviewed - No data to display  EKG   Radiology No results found.  Procedures Procedures (including critical care time)  Medications Ordered in UC Medications - No data to display  Initial Impression / Assessment and Plan / UC  Course  I have reviewed the triage vital signs and the nursing notes.  Pertinent labs & imaging results that were available during my care of the patient were reviewed by me and considered in my medical decision making (see chart for details).     Strongly suspect musculoskeletal etiology given clinical presentation.  We will  start patient on ibuprofen 800 mg with instruction not to take additional NSAIDs.  He was prescribed Flexeril up to twice a day with instruction not to drive or drink alcohol with taking this medication.  He can use lidocaine patches for additional symptom relief.  Recommended conservative treatment measures.  Discussed that I have a low suspicion for retropharyngeal abscess or other deep infection given he is afebrile, nontoxic, nontachycardic, however, if his symptoms or not improving or if anything worsens he would need to go to the emergency room for further evaluation and management since we do not have imaging capabilities.  Did request that he get an x-ray but we do not have x-ray onsite so he will have to go to one of our ERs for this imaging.  Patient reports that he will go and we will contact him with these results.  Discussed that if he has any worsening symptoms including persistent/severe pain, fever, headache, weakness, numbness, paresthesias, dysphagia, odynophagia, muffled voice he needs to go to the emergency room immediately to which he expressed understanding.  Strict return precautions given.  Work excuse note provided.  Final Clinical Impressions(s) / UC Diagnoses   Final diagnoses:  Neck pain on right side  Muscle spasm     Discharge Instructions      Go get your x-ray at [facility].  We will contact you with these results.  Use ibuprofen 800 mg up to 3 times a day.  Do not take NSAIDs including aspirin, ibuprofen/Advil, naproxen/Aleve with this medication.  You can use Tylenol for breakthrough pain.  Take cyclobenzaprine up to twice a day.   This will make you sleepy do not drive or drink alcohol with taking it.  Use lidocaine patch on specific area for 12 hours during the day but then take it off for 12 hours at night.  Use only 1 patch per 24 hours.  As we discussed, if your symptoms are proving quickly you need to go to the emergency room for further evaluation and management.  If anything worsens and you develop fever, nausea, vomiting, increased pain, headache, difficulty swallowing, difficulty breathing, swelling of your throat you need to go to the emergency room immediately.     ED Prescriptions     Medication Sig Dispense Auth. Provider   cyclobenzaprine (FLEXERIL) 10 MG tablet Take 1 tablet (10 mg total) by mouth 2 (two) times daily as needed for muscle spasms. 20 tablet Yandell Mcjunkins K, [name]   ibuprofen (ADVIL) 800 MG tablet Take 1 tablet (800 mg total) by mouth 3 (three) times daily. 21 tablet Esperanza Madrazo K, [name]   lidocaine (LIDODERM) 5 % Place 1 patch onto the skin daily. Remove & Discard patch within 12 hours or as directed by MD 30 patch Carless Slatten K, [name]      PDMP not reviewed this encounter.   Terrilee Croak, [name] 09/30/21 1702

## 2021-10-02 DIAGNOSIS — Z419 Encounter for procedure for purposes other than remedying health state, unspecified: Secondary | ICD-10-CM | POA: Diagnosis not present

## 2021-11-01 DIAGNOSIS — Z419 Encounter for procedure for purposes other than remedying health state, unspecified: Secondary | ICD-10-CM | POA: Diagnosis not present

## 2021-12-02 DIAGNOSIS — Z419 Encounter for procedure for purposes other than remedying health state, unspecified: Secondary | ICD-10-CM | POA: Diagnosis not present

## 2021-12-07 ENCOUNTER — Ambulatory Visit
Admission: EM | Admit: 2021-12-07 | Discharge: 2021-12-07 | Disposition: A | Discharge status: Home / Self Care | Payer: Medicaid Other | Attending: Physician Assistant | Admitting: Physician Assistant

## 2021-12-07 DIAGNOSIS — M546 Pain in thoracic spine: Secondary | ICD-10-CM | POA: Diagnosis not present

## 2021-12-07 MED ORDER — CYCLOBENZAPRINE HCL 10 MG PO TABS: 10.0000 mg | ORAL_TABLET | Freq: Two times a day (BID) | ORAL | 0 refills | Status: AC | PRN

## 2021-12-07 MED ORDER — PREDNISONE 20 MG PO TABS: 40.0000 mg | ORAL_TABLET | Freq: Every day | ORAL | 0 refills | Status: AC

## 2021-12-07 NOTE — ED Triage Notes (Signed)
Pt presents with back pain with no known injury.

## 2021-12-07 NOTE — ED Provider Notes (Signed)
[name]    CSN: [id number] Arrival date & time: 12/07/21  1058      History   Chief Complaint Chief Complaint  Patient presents with   Back Pain    HPI Phillip Mcconnell is a 41 y.o. male.   Patient here today for evaluation of thoracic back pain that started yesterday.  He reports that he has not had any injury that he is aware of.  He states that typically he will have similar symptoms to his lower back.  Movement worsens pain.  He has not had any fever.  He denies any nausea or vomiting.  He has not had any numbness or tingling.  He has tried ibuprofen and muscle relaxer with temporary relief.  The history is provided by the patient.  Back Pain Associated symptoms: no fever and no numbness     Past Medical History:  Diagnosis Date   Chronic back pain    Hypertension     Patient Active Problem List   Diagnosis Date Noted   Prediabetes 09/30/2021   Essential hypertension 06/18/2019    History reviewed. No pertinent surgical history.     Home Medications    Prior to Admission medications   Medication Sig Start Date End Date Taking? Authorizing Provider  cyclobenzaprine (FLEXERIL) 10 MG tablet Take 1 tablet (10 mg total) by mouth 2 (two) times daily as needed for muscle spasms. 12/07/21  Yes Francene Finders, [name]  predniSONE (DELTASONE) 20 MG tablet Take 2 tablets (40 mg total) by mouth daily with breakfast for 5 days. 12/07/21 12/12/21 Yes Francene Finders, [name]  amLODipine (NORVASC) 10 MG tablet Take 1 tablet (10 mg total) by mouth daily. 09/29/21 12/28/21  Camillia Herter, NP  ibuprofen (ADVIL) 800 MG tablet Take 1 tablet (800 mg total) by mouth 3 (three) times daily. 09/30/21   Raspet, Erin K, [name]  lidocaine (LIDODERM) 5 % Place 1 patch onto the skin daily. Remove & Discard patch within 12 hours or as directed by MD 09/30/21   Raspet, Junie Panning K, [name]  losartan (COZAAR) 25 MG tablet Take 1 tablet (25 mg total) by mouth daily. 09/29/21 12/28/21   Camillia Herter, NP  traZODone (DESYREL) 50 MG tablet Take 1 tablet (50 mg total) by mouth at bedtime. 09/29/21   Camillia Herter, NP    Family History History reviewed. No pertinent family history.  Social History Social History   Tobacco Use   Smoking status: Every Day    Packs/day: 1.00    Types: Cigarettes    Passive exposure: Current   Smokeless tobacco: Never  Vaping Use   Vaping Use: Never used  Substance Use Topics   Alcohol use: Yes    Alcohol/week: 5.0 standard drinks of alcohol    Types: 5 Cans of beer per week    Comment: occ   Drug use: No     Allergies   Patient has no known allergies.   Review of Systems Review of Systems  Constitutional:  Negative for chills and fever.  Eyes:  Negative for discharge and redness.  Musculoskeletal:  Positive for back pain and myalgias.  Neurological:  Negative for numbness.     Physical Exam Triage Vital Signs ED Triage Vitals  Enc Vitals Group     BP 12/07/21 1315 (!) 147/94     Pulse Rate 12/07/21 1314 91     Resp 12/07/21 1314 18     Temp 12/07/21 1314 98.3 F (36.8 C)  Temp Source 12/07/21 1314 Oral     SpO2 12/07/21 1314 98 %     Weight --      Height --      Head Circumference --      Peak Flow --      Pain Score 12/07/21 1315 8     Pain Loc --      Pain Edu? --      Excl. in [place]? --    No data found.  Updated Vital Signs BP (!) 147/94   Pulse 91   Temp 98.3 F (36.8 C) (Oral)   Resp 18   SpO2 98%     Physical Exam Vitals and nursing note reviewed.  Constitutional:      General: He is not in acute distress.    Appearance: Normal appearance. He is not ill-appearing.  HENT:     Head: Normocephalic and atraumatic.  Eyes:     Conjunctiva/sclera: Conjunctivae normal.  Cardiovascular:     Rate and Rhythm: Normal rate.  Pulmonary:     Effort: Pulmonary effort is normal.  Musculoskeletal:     Comments: No tenderness to palpation to midline lumbar spine, mild tenderness to palpation  noted diffusely to thoracic back  Neurological:     Mental Status: He is alert.  Psychiatric:        Mood and Affect: Mood normal.        Behavior: Behavior normal.        Thought Content: Thought content normal.      UC Treatments / Results  Labs (all labs ordered are listed, but only abnormal results are displayed) Labs Reviewed - No data to display  EKG   Radiology No results found.  Procedures Procedures (including critical care time)  Medications Ordered in UC Medications - No data to display  Initial Impression / Assessment and Plan / UC Course  I have reviewed the triage vital signs and the nursing notes.  Pertinent labs & imaging results that were available during my care of the patient were reviewed by me and considered in my medical decision making (see chart for details).    Suspect likely muscle strain and will treat to cover same with steroid burst and muscle relaxers.  Recommended further evaluation if no gradual improvement or with any further concerns.  Final Clinical Impressions(s) / UC Diagnoses   Final diagnoses:  Acute bilateral thoracic back pain   Discharge Instructions   None    ED Prescriptions     Medication Sig Dispense Auth. Provider   predniSONE (DELTASONE) 20 MG tablet Take 2 tablets (40 mg total) by mouth daily with breakfast for 5 days. 10 tablet Ewell Poe F, [name]   cyclobenzaprine (FLEXERIL) 10 MG tablet Take 1 tablet (10 mg total) by mouth 2 (two) times daily as needed for muscle spasms. 20 tablet Francene Finders, [name]      PDMP not reviewed this encounter.   Francene Finders, [name] 12/07/21 1336

## 2021-12-22 NOTE — Progress Notes (Signed)
Erroneous encounter-disregard

## 2021-12-29 ENCOUNTER — Encounter: Payer: Medicaid Other | Admitting: Family

## 2021-12-29 DIAGNOSIS — I10 Essential (primary) hypertension: Secondary | ICD-10-CM

## 2022-01-01 DIAGNOSIS — Z419 Encounter for procedure for purposes other than remedying health state, unspecified: Secondary | ICD-10-CM | POA: Diagnosis not present

## 2022-01-13 ENCOUNTER — Ambulatory Visit (HOSPITAL_COMMUNITY): Payer: Medicaid Other | Admitting: Student

## 2022-02-01 DIAGNOSIS — Z419 Encounter for procedure for purposes other than remedying health state, unspecified: Secondary | ICD-10-CM | POA: Diagnosis not present

## 2022-03-04 DIAGNOSIS — Z419 Encounter for procedure for purposes other than remedying health state, unspecified: Secondary | ICD-10-CM | POA: Diagnosis not present

## 2022-04-02 DIAGNOSIS — Z419 Encounter for procedure for purposes other than remedying health state, unspecified: Secondary | ICD-10-CM | POA: Diagnosis not present

## 2022-05-03 DIAGNOSIS — Z419 Encounter for procedure for purposes other than remedying health state, unspecified: Secondary | ICD-10-CM | POA: Diagnosis not present

## 2022-05-11 NOTE — Progress Notes (Deleted)
  Patient ID: Phillip Mcconnell, male    DOB: 07/15/1980  MRN: [id number]  CC: Chronic Care Management   Subjective: Phillip Mcconnell is a 42 y.o. male who presents for chronic care management.   His concerns today include:  HTN - Amlodipine, Losartan  Predm check Psych - anxiety depression, insomnia   Patient Active Problem List   Diagnosis Date Noted   Prediabetes 09/30/2021   Essential hypertension 06/18/2019     Current Outpatient Medications on File Prior to Visit  Medication Sig Dispense Refill   amLODipine (NORVASC) 10 MG tablet Take 1 tablet (10 mg total) by mouth daily. 30 tablet 2   cyclobenzaprine (FLEXERIL) 10 MG tablet Take 1 tablet (10 mg total) by mouth 2 (two) times daily as needed for muscle spasms. 20 tablet 0   ibuprofen (ADVIL) 800 MG tablet Take 1 tablet (800 mg total) by mouth 3 (three) times daily. 21 tablet 0   lidocaine (LIDODERM) 5 % Place 1 patch onto the skin daily. Remove & Discard patch within 12 hours or as directed by MD 30 patch 0   losartan (COZAAR) 25 MG tablet Take 1 tablet (25 mg total) by mouth daily. 30 tablet 2   traZODone (DESYREL) 50 MG tablet Take 1 tablet (50 mg total) by mouth at bedtime. 30 tablet 2   No current facility-administered medications on file prior to visit.    No Known Allergies  Social History   Socioeconomic History   Marital status: Married    Spouse name: Not on file   Number of children: Not on file   Years of education: Not on file   Highest education level: Not on file  Occupational History   Not on file  Tobacco Use   Smoking status: Every Day    Packs/day: 1    Types: Cigarettes    Passive exposure: Current   Smokeless tobacco: Never  Vaping Use   Vaping Use: Never used  Substance and Sexual Activity   Alcohol use: Yes    Alcohol/week: 5.0 standard drinks of alcohol    Types: 5 Cans of beer per week    Comment: occ   Drug use: No   Sexual activity: Yes  Other Topics Concern   Not on file   Social History Narrative   Not on file   Social Determinants of Health   Financial Resource Strain: Not on file  Food Insecurity: Not on file  Transportation Needs: Not on file  Physical Activity: Not on file  Stress: Not on file  Social Connections: Not on file  Intimate Partner Violence: Not on file    No family history on file.  No past surgical history on file.  ROS: Review of Systems Negative except as stated above  PHYSICAL EXAM: There were no vitals taken for this visit.  Physical Exam  {male adult master:310786} {male adult master:310785}     Latest Ref Rng & Units 09/29/2021   10:28 AM 04/14/2021   10:03 AM 03/24/2020   10:51 AM  CMP  Glucose 70 - 99 mg/dL 94  94  86   BUN 6 - 24 mg/dL 11  15  10   Creatinine 0.76 - 1.27 mg/dL 1.03  1.25  1.06   Sodium 134 - 144 mmol/L 142  135  140   Potassium 3.5 - 5.2 mmol/L 4.3  4.7  4.2   Chloride 96 - 106 mmol/L 104  99  101   CO2 20 - 29 mmol/L 22    26  22   Calcium 8.7 - 10.2 mg/dL 9.2  9.5  9.7   Total Protein 6.0 - 8.5 g/dL  6.9    Total Bilirubin 0.0 - 1.2 mg/dL  0.5    Alkaline Phos 44 - 121 IU/L  75    AST 0 - 40 IU/L  37    ALT 0 - 44 IU/L  31     Lipid Panel     Component Value Date/Time   CHOL 128 03/24/2020 1051   TRIG 65 03/24/2020 1051   HDL 46 03/24/2020 1051   CHOLHDL 2.8 03/24/2020 1051   LDLCALC 68 03/24/2020 1051    CBC    Component Value Date/Time   WBC 9.0 04/14/2021 1003   WBC 8.3 04/02/2017 2137   RBC 6.40 (H) 04/14/2021 1003   RBC 5.28 04/02/2017 2137   HGB 15.3 04/14/2021 1003   HCT 48.7 04/14/2021 1003   PLT 304 04/14/2021 1003   MCV 76 (L) 04/14/2021 1003   MCH 23.9 (L) 04/14/2021 1003   MCH 25.0 (L) 04/02/2017 2137   MCHC 31.4 (L) 04/14/2021 1003   MCHC 33.9 04/02/2017 2137   RDW 15.2 04/14/2021 1003   LYMPHSABS 3.1 05/21/2019 1133   EOSABS 0.5 (H) 05/21/2019 1133   BASOSABS 0.1 05/21/2019 1133    ASSESSMENT AND PLAN:  There are no diagnoses linked to this  encounter.   Patient was given the opportunity to ask questions.  Patient verbalized understanding of the plan and was able to repeat key elements of the plan. Patient was given clear instructions to go to Emergency Department or return to medical center if symptoms don't improve, worsen, or new problems develop.The patient verbalized understanding.   No orders of the defined types were placed in this encounter.    Requested Prescriptions    No prescriptions requested or ordered in this encounter    No follow-ups on file.  Rema Fendt, NP

## 2022-05-18 ENCOUNTER — Ambulatory Visit: Payer: Medicaid Other | Admitting: Family

## 2022-05-18 DIAGNOSIS — Z13220 Encounter for screening for lipoid disorders: Secondary | ICD-10-CM

## 2022-05-18 DIAGNOSIS — R7303 Prediabetes: Secondary | ICD-10-CM

## 2022-05-18 DIAGNOSIS — I10 Essential (primary) hypertension: Secondary | ICD-10-CM

## 2022-05-18 NOTE — Progress Notes (Signed)
Erroneous encounter-disregard

## 2022-05-24 ENCOUNTER — Encounter: Payer: Medicaid Other | Admitting: Family

## 2022-05-24 DIAGNOSIS — Z13220 Encounter for screening for lipoid disorders: Secondary | ICD-10-CM

## 2022-05-24 DIAGNOSIS — R7303 Prediabetes: Secondary | ICD-10-CM

## 2022-05-24 DIAGNOSIS — I10 Essential (primary) hypertension: Secondary | ICD-10-CM

## 2022-07-28 IMAGING — DX DG HAND COMPLETE 3+V*R*
3 series · 3 of 3 positions shown · non-contrast
Comparison: 09/04/2016

CLINICAL DATA: Hand pain and swelling 1 day

EXAM:
RIGHT HAND - COMPLETE 3+ VIEW

[hand pa]
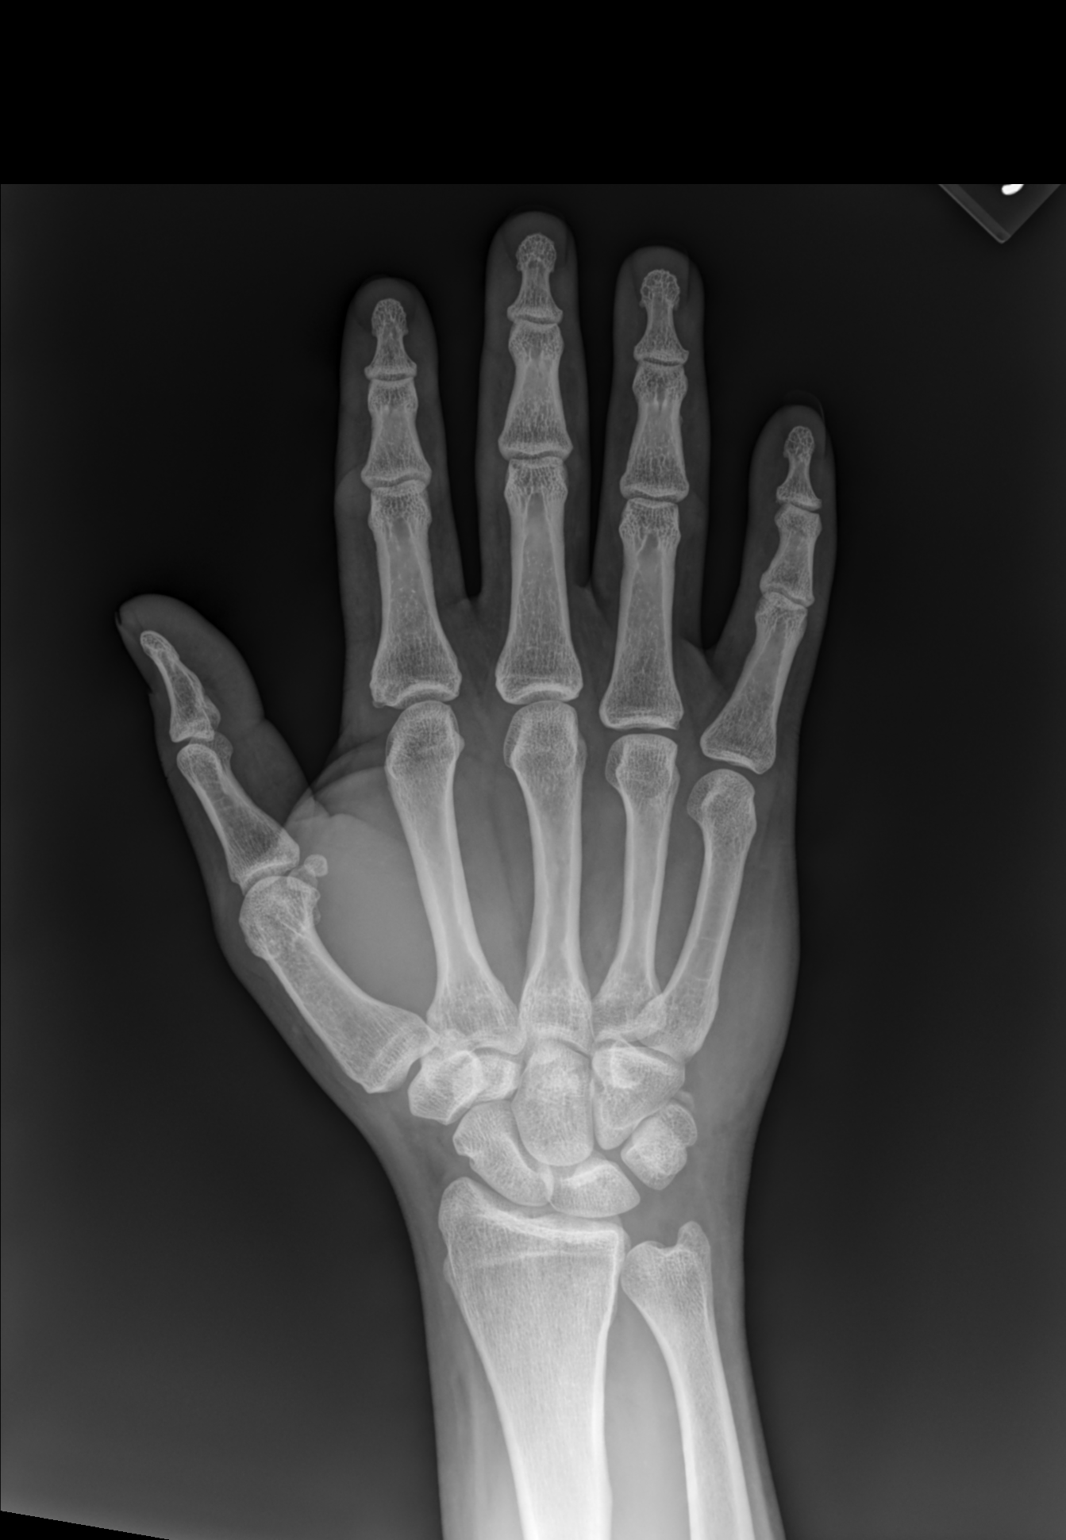

[hand mlo]
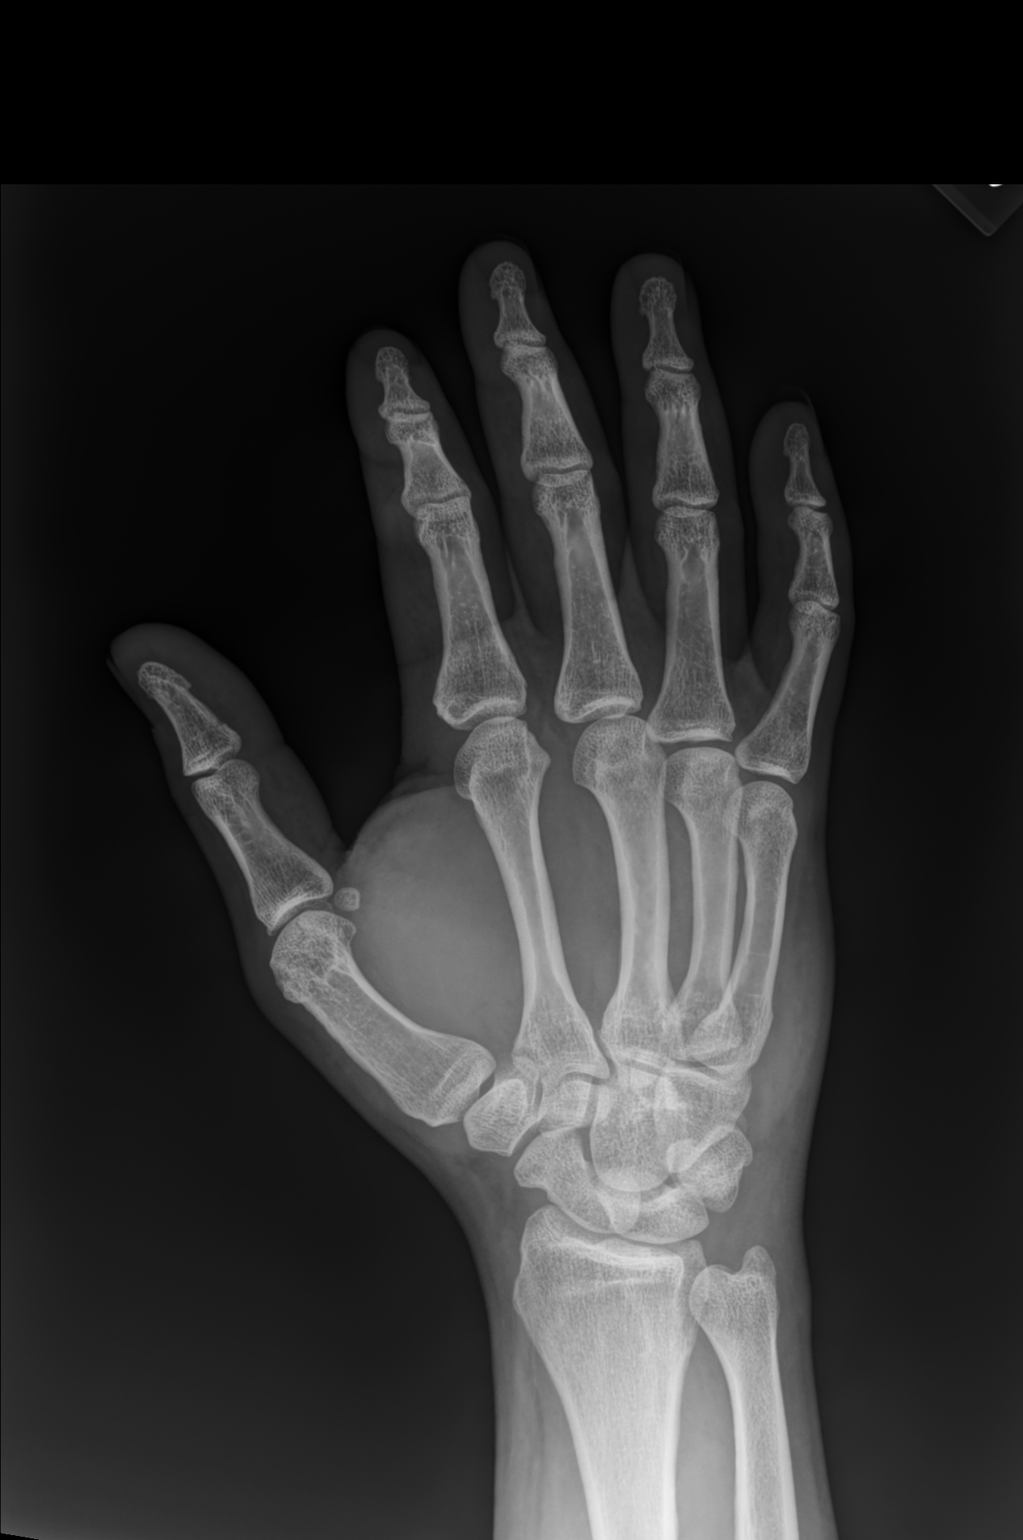

[hand lat]
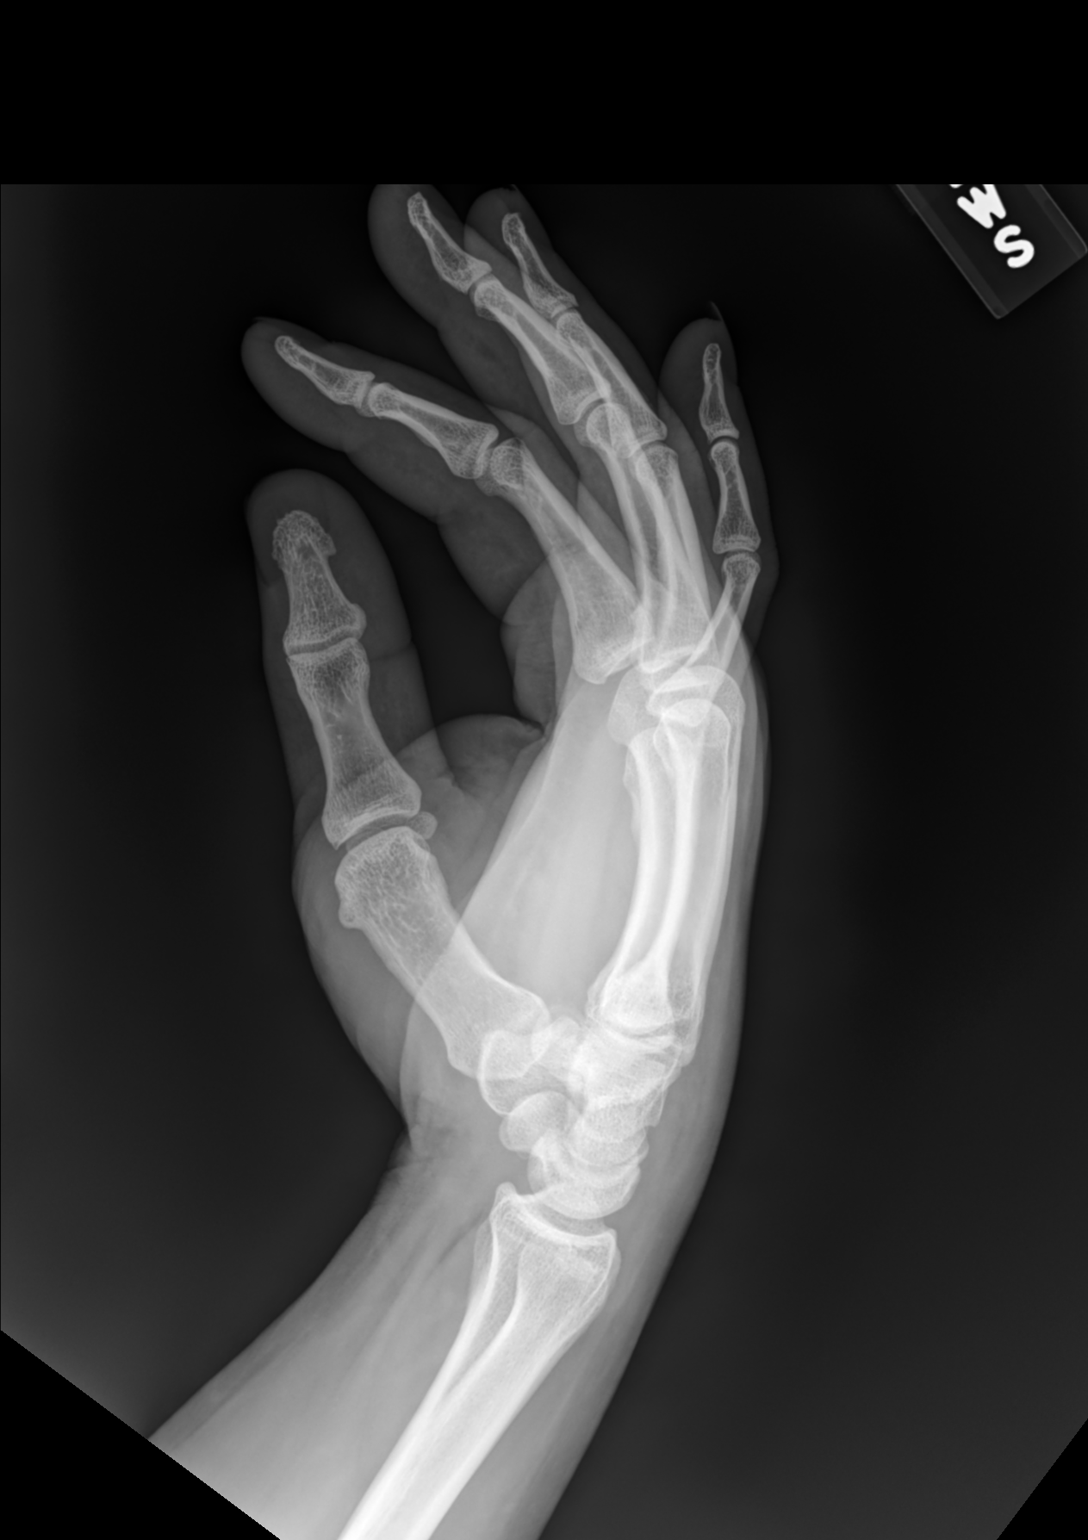

[3 of 3 positions shown; findings below may reference images not displayed]

FINDINGS: There is no evidence of fracture or dislocation. There is no
evidence of arthropathy or other focal bone abnormality. Soft
tissues are unremarkable. Short ulna. Radiocarpal joint normal.
IMPRESSION: Negative.

## 2023-03-21 ENCOUNTER — Ambulatory Visit: Payer: Self-pay | Admitting: Family

## 2023-03-21 NOTE — Telephone Encounter (Signed)
 Copied from CRM [id number]. Topic: Clinical - Red Word Triage >> Mar 21, 2023 12:55 PM Geroge Baseman wrote: Red Word that prompted transfer to Nurse Triage: Patient is having high blood pressure and does not have medication, does not have specific readings but is concerned  Chief Complaint: Possible BP fluctuation/wants BP medication Symptoms: Recent dizziness and headache Frequency: A few days Pertinent Negatives: Patient denies symptoms at this time Disposition: [] ED /[] Urgent Care (no appt availability in office) / [x] Appointment(In office/virtual)/ []  [facility]/ [] Home Care/ [] Refused Recommended Disposition /[] [place] [facility]/ []  Follow-up with PCP Additional Notes: This RN spoke to patient and patient's wife. Patient called in because he thinks his BP is fluctuating and is looking to start BP medication. Patient experienced dizziness and a headache on Thursday and at some point over the weekend. Patient stated the dizziness started after he stood up too quickly. Patient stated he is not experiencing any symptoms at this time. Patient stated he has not taken his BP measurement and does not have a way to. Patient stated that he has had BP issues in the past and stopped taking his BP medication a month ago because it made him sick. Patient denied weakness and stated he is able to walk around normally. Patient denied chest pain. This RN advised patient to see a provider within 24 hours. No availability with PCP or in PCP office. This RN scheduled patient at another office for tomorrow morning. This RN educated the patient/wife on when additional care is appropriate. Patient complied.   Reason for Disposition  [1] MODERATE dizziness (e.g., interferes with normal activities) AND [2] has NOT been evaluated by doctor (or NP/PA) for this  (Exception: Dizziness caused by heat exposure, sudden standing, or poor fluid intake.)  Answer Assessment - Initial Assessment Questions 1. BLOOD  PRESSURE: "What is the blood pressure?" "Did you take at least two measurements 5 minutes apart?"     States he has not recently taken his blood pressure 2. ONSET: "When did you take your blood pressure?"     States symptoms started last Thursday over work and happened again over the weekend, denies symptoms at this time.  3. HOW: "How did you take your blood pressure?" (e.g., automatic home BP monitor, visiting nurse)     States they do not have a way of taking his blood pressure at home 4. HISTORY: "Do you have a history of high blood pressure?"     Yes, states he has had these symptoms in the past when his BP has been high 5. MEDICINES: "Are you taking any medicines for blood pressure?" "Have you missed any doses recently?"     States he recently stopped taking his blood pressure medication on his own within the last month 6. OTHER SYMPTOMS: "Do you have any symptoms?" (e.g., blurred vision, chest pain, difficulty breathing, headache, weakness)     Dizziness and lightheadedness within last few days, headache within last few days, denied chest pain, denied vision changes, denied weakness, denied facial changes  Protocols used: Blood Pressure - High-A-AH, Dizziness - Lightheadedness-A-AH

## 2023-03-22 ENCOUNTER — Encounter: Payer: Self-pay | Admitting: Family Medicine

## 2023-03-22 ENCOUNTER — Ambulatory Visit (INDEPENDENT_AMBULATORY_CARE_PROVIDER_SITE_OTHER): Payer: Medicaid Other | Admitting: Family Medicine

## 2023-03-22 VITALS — BP 130/92 | HR 75 | Temp 98.4°F | Ht 65.0 in | Wt 167.2 lb

## 2023-03-22 DIAGNOSIS — F5104 Psychophysiologic insomnia: Secondary | ICD-10-CM | POA: Diagnosis not present

## 2023-03-22 DIAGNOSIS — E559 Vitamin D deficiency, unspecified: Secondary | ICD-10-CM

## 2023-03-22 DIAGNOSIS — R7303 Prediabetes: Secondary | ICD-10-CM

## 2023-03-22 DIAGNOSIS — L245 Irritant contact dermatitis due to other chemical products: Secondary | ICD-10-CM

## 2023-03-22 DIAGNOSIS — R630 Anorexia: Secondary | ICD-10-CM

## 2023-03-22 DIAGNOSIS — F411 Generalized anxiety disorder: Secondary | ICD-10-CM

## 2023-03-22 DIAGNOSIS — R42 Dizziness and giddiness: Secondary | ICD-10-CM | POA: Diagnosis not present

## 2023-03-22 DIAGNOSIS — I10 Essential (primary) hypertension: Secondary | ICD-10-CM

## 2023-03-22 DIAGNOSIS — I951 Orthostatic hypotension: Secondary | ICD-10-CM

## 2023-03-22 LAB — COMPREHENSIVE METABOLIC PANEL
ALT: 28 U/L (ref 0–53)
AST: 45 U/L — ABNORMAL HIGH (ref 0–37)
Albumin: 4.5 g/dL (ref 3.5–5.2)
Alkaline Phosphatase: 53 U/L (ref 39–117)
BUN: 13 mg/dL (ref 6–23)
CO2: 24 meq/L (ref 19–32)
Calcium: 9.3 mg/dL (ref 8.4–10.5)
Chloride: 104 meq/L (ref 96–112)
Creatinine, Ser: 1.13 mg/dL (ref 0.40–1.50)
GFR: 79.84 mL/min (ref >60.00)
Glucose, Bld: 90 mg/dL (ref 70–99)
Potassium: 5 meq/L (ref 3.5–5.1)
Sodium: 138 meq/L (ref 135–145)
Total Bilirubin: 0.4 mg/dL (ref 0.2–1.2)
Total Protein: 7.4 g/dL (ref 6.0–8.3)

## 2023-03-22 LAB — CBC WITH DIFFERENTIAL/PLATELET
Basophils Absolute: 0.1 10*3/uL (ref 0.0–0.1)
Basophils Relative: 0.9 % (ref 0.0–3.0)
Eosinophils Absolute: 0.4 10*3/uL (ref 0.0–0.7)
Eosinophils Relative: 5.1 % — ABNORMAL HIGH (ref 0.0–5.0)
HCT: 44.9 % (ref 39.0–52.0)
Hemoglobin: 14.4 g/dL (ref 13.0–17.0)
Lymphocytes Relative: 29.7 % (ref 12.0–46.0)
Lymphs Abs: 2.4 10*3/uL (ref 0.7–4.0)
MCHC: 32 g/dL (ref 30.0–36.0)
MCV: 76.2 fL — ABNORMAL LOW (ref 78.0–100.0)
Monocytes Absolute: 1 10*3/uL (ref 0.1–1.0)
Monocytes Relative: 12.5 % — ABNORMAL HIGH (ref 3.0–12.0)
Neutro Abs: 4.1 10*3/uL (ref 1.4–7.7)
Neutrophils Relative %: 51.8 % (ref 43.0–77.0)
Platelets: 257 10*3/uL (ref 150.0–400.0)
RBC: 5.89 Mil/uL — ABNORMAL HIGH (ref 4.22–5.81)
RDW: 14.4 % (ref 11.5–15.5)
WBC: 7.9 10*3/uL (ref 4.0–10.5)

## 2023-03-22 LAB — TSH: TSH: 1.37 u[IU]/mL (ref 0.35–5.50)

## 2023-03-22 LAB — VITAMIN B12: Vitamin B-12: 457 pg/mL (ref 211–911)

## 2023-03-22 LAB — VITAMIN D 25 HYDROXY (VIT D DEFICIENCY, FRACTURES): VITD: 14.6 ng/mL — ABNORMAL LOW (ref 30.00–100.00)

## 2023-03-22 LAB — HEMOGLOBIN A1C: Hgb A1c MFr Bld: 6.2 % (ref 4.6–6.5)

## 2023-03-22 MED ORDER — TRIAMCINOLONE ACETONIDE 0.1 % EX CREA
1.0000 | TOPICAL_CREAM | Freq: Two times a day (BID) | CUTANEOUS | 0 refills | Status: AC
Start: 2023-03-22 — End: ?

## 2023-03-22 MED ORDER — VITAMIN D (ERGOCALCIFEROL) 1.25 MG (50000 UNIT) PO CAPS: 50000.0000 [IU] | ORAL_CAPSULE | ORAL | 0 refills | Status: AC

## 2023-03-22 MED ORDER — BUSPIRONE HCL 5 MG PO TABS
5.0000 mg | ORAL_TABLET | Freq: Two times a day (BID) | ORAL | 1 refills | Status: AC
Start: 2023-03-22 — End: ?

## 2023-03-22 NOTE — Progress Notes (Signed)
 Acute Office Visit  Subjective:     Patient ID: Phillip Mcconnell, male    DOB: [birth year], 43 y.o.   MRN: [id number]  Chief Complaint  Patient presents with   Acute Visit    Dizziness, first noticed last week at work. Has continued into this week, worse when changing positions    HPI Patient is in today for evaluation of dizziness and headache for the last few days. States that he has not taken BP meds within the last month. Is concerned BP is high again. Also reports increased anxiety and thinks this may have something to do with it. Decreased appetite and not sleeping well.  Does not currently have a therapist.  He reports that he is something new to wash his close, reports a generalized rash all over his body.  Has used hydrocortisone cream with little relief. Denies any shortness of breath, cough, chest pain, palpitations.   ROS Per HPI      Objective:    BP (!) 130/92 (BP Location: Left Arm, Patient Position: Sitting)   Pulse 75   Temp 98.4 F (36.9 C) (Oral)   Ht 5\' 5"  (1.651 m)   Wt 167 lb 3.2 oz (75.8 kg)   SpO2 98%   BMI 27.82 kg/m    Physical Exam Vitals and nursing note reviewed.  Constitutional:      General: He is not in acute distress.    Appearance: Normal appearance.  HENT:     Head: Normocephalic and atraumatic.  Eyes:     Extraocular Movements: Extraocular movements intact.  Cardiovascular:     Rate and Rhythm: Normal rate and regular rhythm.     Pulses: Normal pulses.     Heart sounds: Normal heart sounds. No murmur heard. Pulmonary:     Effort: Pulmonary effort is normal. No respiratory distress.     Breath sounds: Normal breath sounds. No wheezing, rhonchi or rales.  Musculoskeletal:        General: Normal range of motion.     Cervical back: Normal range of motion.  Lymphadenopathy:     Cervical: No cervical adenopathy.  Skin:    General: Skin is warm and dry.     Findings: Rash (generalized fine papular rash, no discharge, no  bleeding) present.  Neurological:     General: No focal deficit present.     Mental Status: He is alert and oriented to person, place, and time.  Psychiatric:        Mood and Affect: Mood normal.        Behavior: Behavior normal.    No results found for any visits on 03/22/23.      Assessment & Plan:  1. Essential hypertension (Primary)  - CBC with Differential/Platelet - Comprehensive metabolic panel  2. Prediabetes  - CBC with Differential/Platelet - Comprehensive metabolic panel - HgB A1c  3. Dizziness  - CBC with Differential/Platelet - Comprehensive metabolic panel - TSH  4. Orthostatic hypotension  - CBC with Differential/Platelet - TSH  5. Irritant contact dermatitis due to other chemical products  - triamcinolone cream (KENALOG) 0.1 %; Apply 1 Application topically 2 (two) times daily.  Dispense: 180 g; Refill: 0  6. GAD (generalized anxiety disorder)  - Ambulatory referral to Psychology - busPIRone (BUSPAR) 5 MG tablet; Take 1 tablet (5 mg total) by mouth 2 (two) times daily.  Dispense: 60 tablet; Refill: 1 - VITAMIN D 25 Hydroxy (Vit-D Deficiency, Fractures) - Vitamin B12 - TSH  7. Decreased appetite  -  VITAMIN D 25 Hydroxy (Vit-D Deficiency, Fractures) - Vitamin B12 - TSH  8. Psychophysiological insomnia  - VITAMIN D 25 Hydroxy (Vit-D Deficiency, Fractures) - Vitamin B12 - TSH  Meds ordered this encounter  Medications   triamcinolone cream (KENALOG) 0.1 %    Sig: Apply 1 Application topically 2 (two) times daily.    Dispense:  180 g    Refill:  0   busPIRone (BUSPAR) 5 MG tablet    Sig: Take 1 tablet (5 mg total) by mouth 2 (two) times daily.    Dispense:  60 tablet    Refill:  1    Return for 2 weeks as needed.  Moshe Cipro, FNP

## 2023-03-22 NOTE — Patient Instructions (Signed)
 I have sent in buspirone for you to take twice a day as needed for anxiety.  I have sent a referral to counseling for you as well.  We are checking labs today, will be in contact with any results that require further attention  I have sent in triamcinolone cream for you.  You may use this twice a day.  Follow-up with me for new or worsening symptoms.

## 2023-03-22 NOTE — Addendum Note (Signed)
 Addended by: Sherald Barge on: 03/22/2023 01:04 PM   Modules accepted: Orders

## 2023-07-11 ENCOUNTER — Ambulatory Visit (INDEPENDENT_AMBULATORY_CARE_PROVIDER_SITE_OTHER): Admitting: Psychology

## 2023-07-11 DIAGNOSIS — F411 Generalized anxiety disorder: Secondary | ICD-10-CM | POA: Diagnosis not present

## 2023-07-11 NOTE — Progress Notes (Signed)
 [facility] Behavioral Health Counselor Initial Adult Exam  Name: Phillip Mcconnell Date: 07/11/2023 MRN: [id number] DOB: 10/15/1980 PCP: Phillip Dama, NP  Time spent: 11:00am-11:50am   50 minutes  Guardian/Payee:  n/a    Paperwork requested: No   Reason for Visit /Presenting Problem: Pt present for face-to-face initial assessment via video.  Pt consents to telehealth video session and is aware of limitations and benefits of virtual sessions. Location of pt: home Location of therapist: home office.  Pt states he has a lot of anxiety and over thinking.  Pt has had anxiety since 2020 when his older sister passed away.   Pt has worried a lot.  Pt was anxious also bc of the pandemic. Pt's wife and kids quit leaving the house for 2 years during the pandemic.   Pt was worried about bringing covid home from work.   Pt's nephew passed away last week.  Pt is grieving.  Pt has had housing and transportation issues.  Pt's wife transports pt and he does not feel anxious when with his wife.   Pt has trouble sleeping bc of worrying.  Pt takes naps during the day.   Pt is not working bc of back issues and anxiety.    Mental Status Exam: Appearance:   Casual     Behavior:  Appropriate  Motor:  Normal  Speech/Language:   Normal Rate  Affect:  Appropriate  Mood:  normal  Thought process:  normal  Thought content:    WNL  Sensory/Perceptual disturbances:    WNL  Orientation:  oriented to person, place, time/date, and situation  Attention:  Good  Concentration:  Good  Memory:  WNL  Fund of knowledge:   Good  Insight:    Good  Judgment:   Good  Impulse Control:  Good    Reported Symptoms:  anxiety  Risk Assessment: Danger to Self:  No Self-injurious Behavior: No Danger to Others: No Duty to Warn:no Physical Aggression / Violence:No  Access to Firearms a concern: No  Gang Involvement:No  Patient / guardian was educated about steps to take if suicide or homicide risk level increases  between visits: n/a While future psychiatric events cannot be accurately predicted, the patient does not currently require acute inpatient psychiatric care and does not currently meet [place]  involuntary commitment criteria.  Substance Abuse History: Current substance abuse: No     Pt has history of drinking too much.  Pt states he now drinks about 2 cans of coolers a day.   Past Psychiatric History:   Previous psychological history is significant for anxiety Outpatient Providers:pt has been in therapy in the past. History of Psych Hospitalization: No  Psychological Testing: n/a   Abuse History:  Victim of: No., n/a   Report needed: No. Victim of Neglect:No. Perpetrator of n/a  Witness / Exposure to Domestic Violence: No   Protective Services Involvement: No  Witness to [employer] Violence:  No   Family History:  Pt grew up with both parents and 10 sisters and one brother.   Pt is number 7 in the birth order.    Pt states he had a difficult childhood.  They were homeless at times.   No childhood abuse.   Family history of mental health issues: none known.  Pt's mother is living.  Pt's father and a couple of sisters are deceased.   Living situation: the patient lives with his wife and 3 daughters ages 8, 4, and 36.  Pt has an older daughter  and son who live on their own.    Sexual Orientation: Straight  Relationship Status: married  Name of spouse / other:Phillip Mcconnell If a parent, number of children / ages:Pt has 5 children.  Support Systems: spouse  Financial Stress:  Yes   Income/Employment/Disability: Unemployment.    (Pt's wife works). Pt is out of work.  He has had trouble keeping a job bc of the anxiety and a back injury.   Military Service: No   Educational History: Education: 11th grade  Religion/Sprituality/World View: Protestant  Any cultural differences that may affect / interfere with treatment:  not applicable   Recreation/Hobbies: playing video games.     Stressors: Other: pt worries about failure, and finances.      Strengths: Supportive Relationships and Able to Communicate Effectively  Barriers:  none   Legal History: Pending legal issue / charges: The patient has no significant history of legal issues. History of legal issue / charges: n/a  Medical History/Surgical History: reviewed Past Medical History:  Diagnosis Date   Chronic back pain    Hypertension     No past surgical history on file.  Medications: Current Outpatient Medications  Medication Sig Dispense Refill   amLODipine  (NORVASC ) 10 MG tablet Take 1 tablet (10 mg total) by mouth daily. 30 tablet 2   busPIRone  (BUSPAR ) 5 MG tablet Take 1 tablet (5 mg total) by mouth 2 (two) times daily. 60 tablet 1   cyclobenzaprine  (FLEXERIL ) 10 MG tablet Take 1 tablet (10 mg total) by mouth 2 (two) times daily as needed for muscle spasms. 20 tablet 0   ibuprofen  (ADVIL ) 800 MG tablet Take 1 tablet (800 mg total) by mouth 3 (three) times daily. 21 tablet 0   lidocaine  (LIDODERM ) 5 % Place 1 patch onto the skin daily. Remove & Discard patch within 12 hours or as directed by MD 30 patch 0   losartan  (COZAAR ) 25 MG tablet Take 1 tablet (25 mg total) by mouth daily. 30 tablet 2   traZODone  (DESYREL ) 50 MG tablet Take 1 tablet (50 mg total) by mouth at bedtime. 30 tablet 2   triamcinolone  cream (KENALOG ) 0.1 % Apply 1 Application topically 2 (two) times daily. 180 g 0   Vitamin D , Ergocalciferol , (DRISDOL ) 1.25 MG (50000 UNIT) CAPS capsule Take 1 capsule (50,000 Units total) by mouth every 7 (seven) days. 8 capsule 0   No current facility-administered medications for this visit.    No Known Allergies  Diagnoses:  F41.1  Plan of Care: Recommend ongoing therapy.   Pt participated in setting treatment goals.   Pt wants to improve coping skills.  Pt would like to "get control over his thoughts" and decrease anxiety.  Plan to meet every two weeks.  Pt agrees with treatment plan.     Treatment Plan Client Abilities/Strengths  Pt is engaging and motivated for therapy.  Client Treatment Preferences  Individual therapy.  Client Statement of Needs  Improve coping skills.  Symptoms  Autonomic hyperactivity (e.g., palpitations, shortness of breath, dry mouth, trouble swallowing, nausea, diarrhea). Excessive and/or unrealistic worry that is difficult to control occurring more days than not for at least 6 months about a number of events or activities. Hypervigilance (e.g., feeling constantly on edge, experiencing concentration difficulties, having trouble falling or staying asleep, exhibiting a general state of irritability). Motor tension (e.g., restlessness, tiredness, shakiness, muscle tension). Problems Addressed  Anxiety Goals 1. Enhance ability to effectively cope with the full variety of life's worries and anxieties. 2. Learn and  implement coping skills that result in a reduction of anxiety and worry, and improved daily functioning. Objective Learn to accept limitations in life and commit to tolerating, rather than avoiding, unpleasant emotions while accomplishing meaningful goals. Target Date: 2024-07-10 Frequency: Biweekly Progress: 10 Modality: individual Related Interventions 1. Use techniques from Acceptance and Commitment Therapy to help client accept uncomfortable realities such as lack of complete control, imperfections, and uncertainty and tolerate unpleasant emotions and thoughts in order to accomplish value-consistent goals. Objective Learn and implement problem-solving strategies for realistically addressing worries. Target Date: 2024-07-10 Frequency: Biweekly Progress: 10 Modality: individual Related Interventions 1. Assign the client a homework exercise in which he/she problem-solves a current problem.  review, reinforce success, and provide corrective feedback toward improvement. 2. Teach the client problem-solving strategies involving specifically  defining a problem, generating options for addressing it, evaluating the pros and cons of each option, selecting and implementing an optional action, and reevaluating and refining the action. Objective Learn and implement calming skills to reduce overall anxiety and manage anxiety symptoms. Target Date: 2024-07-10 Frequency: Biweekly Progress: 10 Modality: individual Related Interventions 1. Assign the client to read about progressive muscle relaxation and other calming strategies in relevant books or treatment manuals (e.g., Progressive Relaxation Training by Rodolfo Clan and Arvil Birks; Mastery of Your Anxiety and Worry: Workbook by Rodney Clamp). 2. Assign the client homework each session in which he/she practices relaxation exercises daily, gradually applying them progressively from non-anxiety-provoking to anxiety-provoking situations; review and reinforce success while providing corrective feedback toward improvement. 3. Teach the client calming/relaxation skills (e.g., applied relaxation, progressive muscle relaxation, cue controlled relaxation; mindful breathing; biofeedback) and how to discriminate better between relaxation and tension; teach the client how to apply these skills to his/her daily life. 3. Reduce overall frequency, intensity, and duration of the anxiety so that daily functioning is not impaired. 4. Resolve the core conflict that is the source of anxiety. 5. Stabilize anxiety level while increasing ability to function on a daily basis. Diagnosis :    F41.1  Generalized Anxiety Disorder  Conditions For Discharge Achievement of treatment goals and objectives.      Jaeleen Inzunza, LCSW

## 2023-09-12 ENCOUNTER — Ambulatory Visit: Admitting: Psychology

## 2023-09-12 NOTE — Progress Notes (Deleted)
 [facility] Counselor/Therapist Progress Note  Patient ID: Phillip Mcconnell, MRN: [id number],    Date: 09/12/2023  Time Spent: 11:00am-11:45am   45 minutes   Treatment Type: Individual Therapy  Reported Symptoms: anxiety  Mental Status Exam: Appearance:  Casual     Behavior: Appropriate  Motor: Normal  Speech/Language:  Normal Rate  Affect: Appropriate  Mood: normal  Thought process: normal  Thought content:   WNL  Sensory/Perceptual disturbances:   WNL  Orientation: oriented to person, place, time/date, and situation  Attention: Good  Concentration: Good  Memory: WNL  Fund of knowledge:  Good  Insight:   Good  Judgment:  Good  Impulse Control: Good   Risk Assessment: Danger to Self:  No Self-injurious Behavior: No Danger to Others: No Duty to Warn:no Physical Aggression / Violence:No  Access to Firearms a concern: No  Gang Involvement:No   Subjective: Pt present for face-to-face individual therapy via video.  Pt consents to telehealth video session and is aware of limitations and benefits of virtual sessions.  Location of pt: home Location of therapist: home office.    Interventions: Cognitive Behavioral Therapy and Insight-Oriented  Diagnosis:  F41.1  Plan of Care: Recommend ongoing therapy.   Pt participated in setting treatment goals.   Pt wants to improve coping skills.  Pt would like to get control over his thoughts and decrease anxiety.  Plan to meet every two weeks.  Pt agrees with treatment plan.    Treatment Plan Client Abilities/Strengths  Pt is engaging and motivated for therapy.  Client Treatment Preferences  Individual therapy.  Client Statement of Needs  Improve coping skills.  Symptoms  Autonomic hyperactivity (e.g., palpitations, shortness of breath, dry mouth, trouble swallowing, nausea, diarrhea). Excessive and/or unrealistic worry that is difficult to control occurring more days than not for at least 6 months about a  number of events or activities. Hypervigilance (e.g., feeling constantly on edge, experiencing concentration difficulties, having trouble falling or staying asleep, exhibiting a general state of irritability). Motor tension (e.g., restlessness, tiredness, shakiness, muscle tension). Problems Addressed  Anxiety Goals 1. Enhance ability to effectively cope with the full variety of life's worries and anxieties. 2. Learn and implement coping skills that result in a reduction of anxiety and worry, and improved daily functioning. Objective Learn to accept limitations in life and commit to tolerating, rather than avoiding, unpleasant emotions while accomplishing meaningful goals. Target Date: 2024-07-10 Frequency: Biweekly Progress: 10 Modality: individual Related Interventions 1. Use techniques from Acceptance and Commitment Therapy to help client accept uncomfortable realities such as lack of complete control, imperfections, and uncertainty and tolerate unpleasant emotions and thoughts in order to accomplish value-consistent goals. Objective Learn and implement problem-solving strategies for realistically addressing worries. Target Date: 2024-07-10 Frequency: Biweekly Progress: 10 Modality: individual Related Interventions 1. Assign the client a homework exercise in which he/she problem-solves a current problem.  review, reinforce success, and provide corrective feedback toward improvement. 2. Teach the client problem-solving strategies involving specifically defining a problem, generating options for addressing it, evaluating the pros and cons of each option, selecting and implementing an optional action, and reevaluating and refining the action. Objective Learn and implement calming skills to reduce overall anxiety and manage anxiety symptoms. Target Date: 2024-07-10 Frequency: Biweekly Progress: 10 Modality: individual Related Interventions 1. Assign the client to read about progressive muscle  relaxation and other calming strategies in relevant books or treatment manuals (e.g., Progressive Relaxation Training by Thornell and Elmer; Mastery of Your Anxiety and Worry: Workbook  by Richarda armin Given). 2. Assign the client homework each session in which he/she practices relaxation exercises daily, gradually applying them progressively from non-anxiety-provoking to anxiety-provoking situations; review and reinforce success while providing corrective feedback toward improvement. 3. Teach the client calming/relaxation skills (e.g., applied relaxation, progressive muscle relaxation, cue controlled relaxation; mindful breathing; biofeedback) and how to discriminate better between relaxation and tension; teach the client how to apply these skills to his/her daily life. 3. Reduce overall frequency, intensity, and duration of the anxiety so that daily functioning is not impaired. 4. Resolve the core conflict that is the source of anxiety. 5. Stabilize anxiety level while increasing ability to function on a daily basis. Diagnosis :    F41.1  Generalized Anxiety Disorder  Conditions For Discharge Achievement of treatment goals and objectives.  Lizzie An, LCSW

## 2023-09-26 ENCOUNTER — Ambulatory Visit (INDEPENDENT_AMBULATORY_CARE_PROVIDER_SITE_OTHER): Admitting: Psychology

## 2023-09-26 DIAGNOSIS — F411 Generalized anxiety disorder: Secondary | ICD-10-CM

## 2023-09-26 NOTE — Progress Notes (Signed)
 [facility] Counselor/Therapist Progress Note  Patient ID: Phillip Mcconnell, MRN: [id number],    Date: 09/26/2023  Time Spent: 11:00am-11:55am   55 minutes   Treatment Type: Individual Therapy  Reported Symptoms: anxiety, worrying  Mental Status Exam: Appearance:  Casual     Behavior: Appropriate  Motor: Normal  Speech/Language:  Normal Rate  Affect: Appropriate  Mood: normal  Thought process: normal  Thought content:   WNL  Sensory/Perceptual disturbances:   WNL  Orientation: oriented to person, place, time/date, and situation  Attention: Good  Concentration: Good  Memory: WNL  Fund of knowledge:  Good  Insight:   Good  Judgment:  Good  Impulse Control: Good   Risk Assessment: Danger to Self:  No Self-injurious Behavior: No Danger to Others: No Duty to Warn:no Physical Aggression / Violence:No  Access to Firearms a concern: No  Gang Involvement:No   Subjective: Pt present for face-to-face individual therapy via video.  Pt consents to telehealth video session and is aware of limitations and benefits of virtual sessions.  Location of pt: home Location of therapist: home office.   Pt talked about having trouble sleeping.  He stays up most of the night.   He only sleeps from 3am-5am bc he has to get up to take the kids to school.   Pt does take naps during the day.   Addressed that the naps disrupt his nighttime sleep.  Pt state he sleeps better on the weekends and sleeps from 10pm-8am.   Pt talked about his anxiety.   He worries a lot and tries to stay busy to distract himself from worry thoughts.   Pt worries about his health.  He had to have his teeth removed and is having trouble getting use to the false teeth.   He worries about money.   Pt does not work bc of his anxiety and back pain.   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  Pt is very anxious about driving alone.  He doesn't drive alone bc he had a panic attack  while driving 3 years ago.   Pt can drive is his wife is in the car.  Worked on calming strategies and thought reframing. Gave pt homework to journal his thoughts when anxious.  Provided supportive therapy.    Interventions: Cognitive Behavioral Therapy and Insight-Oriented  Diagnosis:  F41.1  Plan of Care: Recommend ongoing therapy.   Pt participated in setting treatment goals.   Pt wants to improve coping skills.  Pt would like to get control over his thoughts and decrease anxiety.  Plan to meet every two weeks.  Pt agrees with treatment plan.    Treatment Plan Client Abilities/Strengths  Pt is engaging and motivated for therapy.  Client Treatment Preferences  Individual therapy.  Client Statement of Needs  Improve coping skills.  Symptoms  Autonomic hyperactivity (e.g., palpitations, shortness of breath, dry mouth, trouble swallowing, nausea, diarrhea). Excessive and/or unrealistic worry that is difficult to control occurring more days than not for at least 6 months about a number of events or activities. Hypervigilance (e.g., feeling constantly on edge, experiencing concentration difficulties, having trouble falling or staying asleep, exhibiting a general state of irritability). Motor tension (e.g., restlessness, tiredness, shakiness, muscle tension). Problems Addressed  Anxiety Goals 1. Enhance ability to effectively cope with the full variety of life's worries and anxieties. 2. Learn and implement coping skills that result in a reduction of anxiety and worry, and  improved daily functioning. Objective Learn to accept limitations in life and commit to tolerating, rather than avoiding, unpleasant emotions while accomplishing meaningful goals. Target Date: 2024-07-10 Frequency: Biweekly Progress: 10 Modality: individual Related Interventions 1. Use techniques from Acceptance and Commitment Therapy to help client accept uncomfortable realities such as lack of complete control,  imperfections, and uncertainty and tolerate unpleasant emotions and thoughts in order to accomplish value-consistent goals. Objective Learn and implement problem-solving strategies for realistically addressing worries. Target Date: 2024-07-10 Frequency: Biweekly Progress: 10 Modality: individual Related Interventions 1. Assign the client a homework exercise in which he/she problem-solves a current problem.  review, reinforce success, and provide corrective feedback toward improvement. 2. Teach the client problem-solving strategies involving specifically defining a problem, generating options for addressing it, evaluating the pros and cons of each option, selecting and implementing an optional action, and reevaluating and refining the action. Objective Learn and implement calming skills to reduce overall anxiety and manage anxiety symptoms. Target Date: 2024-07-10 Frequency: Biweekly Progress: 10 Modality: individual Related Interventions 1. Assign the client to read about progressive muscle relaxation and other calming strategies in relevant books or treatment manuals (e.g., Progressive Relaxation Training by Thornell and Elmer; Mastery of Your Anxiety and Worry: Workbook by Richarda armin Given). 2. Assign the client homework each session in which he/she practices relaxation exercises daily, gradually applying them progressively from non-anxiety-provoking to anxiety-provoking situations; review and reinforce success while providing corrective feedback toward improvement. 3. Teach the client calming/relaxation skills (e.g., applied relaxation, progressive muscle relaxation, cue controlled relaxation; mindful breathing; biofeedback) and how to discriminate better between relaxation and tension; teach the client how to apply these skills to his/her daily life. 3. Reduce overall frequency, intensity, and duration of the anxiety so that daily functioning is not impaired. 4. Resolve the core conflict  that is the source of anxiety. 5. Stabilize anxiety level while increasing ability to function on a daily basis. Diagnosis :    F41.1  Generalized Anxiety Disorder  Conditions For Discharge Achievement of treatment goals and objectives.  Maleeha Halls, LCSW

## 2023-10-13 ENCOUNTER — Ambulatory Visit: Admitting: Psychology

## 2023-11-04 ENCOUNTER — Ambulatory Visit: Admitting: Psychology

## 2023-11-04 DIAGNOSIS — F411 Generalized anxiety disorder: Secondary | ICD-10-CM | POA: Diagnosis not present

## 2023-11-04 NOTE — Progress Notes (Signed)
 [facility] Counselor/Therapist Progress Note  Patient ID: MARGIE BRINK, MRN: [id number],    Date: 11/04/2023  Time Spent: 9:00am-9:55am   55 minutes   Treatment Type: Individual Therapy  Reported Symptoms: anxiety, worrying  Mental Status Exam: Appearance:  Casual     Behavior: Appropriate  Motor: Normal  Speech/Language:  Normal Rate  Affect: Appropriate  Mood: normal  Thought process: normal  Thought content:   WNL  Sensory/Perceptual disturbances:   WNL  Orientation: oriented to person, place, time/date, and situation  Attention: Good  Concentration: Good  Memory: WNL  Fund of knowledge:  Good  Insight:   Good  Judgment:  Good  Impulse Control: Good   Risk Assessment: Danger to Self:  No Self-injurious Behavior: No Danger to Others: No Duty to Warn:no Physical Aggression / Violence:No  Access to Firearms a concern: No  Gang Involvement:No   Subjective: Pt present for face-to-face individual therapy via video.  Pt consents to telehealth video session and is aware of limitations and benefits of virtual sessions.  Location of pt: home Location of therapist: home office.   Pt talked about his anxiety.  Much of his anxiety is related to safety issues and started during the covid pandemic.   Pt shared about his anxiety experiences and how they have resulted in him being afraid of getting anxious.  Pt states he also tends to over think things a lot.  He worries about his family.   He has had difficult living situations and does not feel safe in his current neighborhood.  Addressed pt's worries and fears.  Worked on calming strategies and thought reframing. Pt is very anxious about driving alone.  He doesn't drive alone bc he had a panic attack while driving 3 years ago.   Pt can drive if his wife is in the car.   Worked with pt on the importance of not avoiding the things he is anxious about.  Encouraged pt to drive around the block once a day by himself.   Pt is in agreement with setting that goal. Gave pt homework to journal his thoughts when anxious.  Provided supportive therapy.    Interventions: Cognitive Behavioral Therapy and Insight-Oriented  Diagnosis:  F41.1  Plan of Care: Recommend ongoing therapy.   Pt participated in setting treatment goals.   Pt wants to improve coping skills.  Pt would like to get control over his thoughts and decrease anxiety.  Plan to meet every two weeks.  Pt agrees with treatment plan.    Treatment Plan Client Abilities/Strengths  Pt is engaging and motivated for therapy.  Client Treatment Preferences  Individual therapy.  Client Statement of Needs  Improve coping skills.  Symptoms  Autonomic hyperactivity (e.g., palpitations, shortness of breath, dry mouth, trouble swallowing, nausea, diarrhea). Excessive and/or unrealistic worry that is difficult to control occurring more days than not for at least 6 months about a number of events or activities. Hypervigilance (e.g., feeling constantly on edge, experiencing concentration difficulties, having trouble falling or staying asleep, exhibiting a general state of irritability). Motor tension (e.g., restlessness, tiredness, shakiness, muscle tension). Problems Addressed  Anxiety Goals 1. Enhance ability to effectively cope with the full variety of life's worries and anxieties. 2. Learn and implement coping skills that result in a reduction of anxiety and worry, and improved daily functioning. Objective Learn to accept limitations in life and commit to tolerating, rather than avoiding, unpleasant emotions while accomplishing meaningful goals. Target Date: 2024-07-10 Frequency: Biweekly Progress: 10 Modality:  individual Related Interventions 1. Use techniques from Acceptance and Commitment Therapy to help client accept uncomfortable realities such as lack of complete control, imperfections, and uncertainty and tolerate unpleasant emotions and thoughts in order  to accomplish value-consistent goals. Objective Learn and implement problem-solving strategies for realistically addressing worries. Target Date: 2024-07-10 Frequency: Biweekly Progress: 10 Modality: individual Related Interventions 1. Assign the client a homework exercise in which he/she problem-solves a current problem.  review, reinforce success, and provide corrective feedback toward improvement. 2. Teach the client problem-solving strategies involving specifically defining a problem, generating options for addressing it, evaluating the pros and cons of each option, selecting and implementing an optional action, and reevaluating and refining the action. Objective Learn and implement calming skills to reduce overall anxiety and manage anxiety symptoms. Target Date: 2024-07-10 Frequency: Biweekly Progress: 10 Modality: individual Related Interventions 1. Assign the client to read about progressive muscle relaxation and other calming strategies in relevant books or treatment manuals (e.g., Progressive Relaxation Training by Thornell and Elmer; Mastery of Your Anxiety and Worry: Workbook by Richarda armin Given). 2. Assign the client homework each session in which he/she practices relaxation exercises daily, gradually applying them progressively from non-anxiety-provoking to anxiety-provoking situations; review and reinforce success while providing corrective feedback toward improvement. 3. Teach the client calming/relaxation skills (e.g., applied relaxation, progressive muscle relaxation, cue controlled relaxation; mindful breathing; biofeedback) and how to discriminate better between relaxation and tension; teach the client how to apply these skills to his/her daily life. 3. Reduce overall frequency, intensity, and duration of the anxiety so that daily functioning is not impaired. 4. Resolve the core conflict that is the source of anxiety. 5. Stabilize anxiety level while increasing ability to  function on a daily basis. Diagnosis :    F41.1  Generalized Anxiety Disorder  Conditions For Discharge Achievement of treatment goals and objectives.  Elizabet Schweppe, LCSW

## 2023-11-25 ENCOUNTER — Ambulatory Visit: Admitting: Psychology

## 2024-01-20 ENCOUNTER — Ambulatory Visit: Admitting: Psychology
